# Patient Record
Sex: Female | Born: 1980 | Race: Black or African American | Hispanic: No | Marital: Single | Smoking: Former smoker
Health system: Southern US, Community
[De-identification: ages and names within clinical notes are randomized; demographics above are authoritative.]

## PROBLEM LIST (undated history)

## (undated) ENCOUNTER — Inpatient Hospital Stay: Payer: Self-pay

## (undated) DIAGNOSIS — I10 Essential (primary) hypertension: Secondary | ICD-10-CM

## (undated) DIAGNOSIS — Z8759 Personal history of other complications of pregnancy, childbirth and the puerperium: Secondary | ICD-10-CM

## (undated) DIAGNOSIS — D573 Sickle-cell trait: Secondary | ICD-10-CM

## (undated) DIAGNOSIS — O24419 Gestational diabetes mellitus in pregnancy, unspecified control: Secondary | ICD-10-CM

## (undated) HISTORY — DX: Gestational diabetes mellitus in pregnancy, unspecified control: O24.419

## (undated) HISTORY — PX: APPENDECTOMY: SHX54

---
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
## 2004-01-27 ENCOUNTER — Emergency Department: Payer: Self-pay | Admitting: Emergency Medicine

## 2004-04-18 ENCOUNTER — Emergency Department: Payer: Self-pay | Admitting: Emergency Medicine

## 2004-05-22 ENCOUNTER — Emergency Department: Payer: Self-pay | Admitting: Emergency Medicine

## 2004-06-13 ENCOUNTER — Emergency Department: Payer: Self-pay | Admitting: Emergency Medicine

## 2004-06-15 ENCOUNTER — Ambulatory Visit: Payer: Self-pay | Admitting: Emergency Medicine

## 2004-07-12 ENCOUNTER — Emergency Department: Payer: Self-pay | Admitting: Emergency Medicine

## 2004-12-23 ENCOUNTER — Emergency Department: Payer: Self-pay | Admitting: Emergency Medicine

## 2005-01-19 ENCOUNTER — Emergency Department: Payer: Self-pay | Admitting: Emergency Medicine

## 2005-03-07 ENCOUNTER — Emergency Department: Payer: Self-pay | Admitting: Emergency Medicine

## 2005-07-22 ENCOUNTER — Emergency Department: Payer: Self-pay | Admitting: General Practice

## 2005-08-16 ENCOUNTER — Emergency Department: Payer: Self-pay | Admitting: Internal Medicine

## 2005-12-07 ENCOUNTER — Emergency Department: Payer: Self-pay

## 2005-12-08 ENCOUNTER — Inpatient Hospital Stay: Payer: Self-pay

## 2006-01-27 ENCOUNTER — Observation Stay: Payer: Self-pay | Admitting: General Surgery

## 2006-02-20 ENCOUNTER — Encounter: Payer: Self-pay | Admitting: Maternal & Fetal Medicine

## 2006-03-08 ENCOUNTER — Observation Stay: Payer: Self-pay

## 2006-04-03 ENCOUNTER — Encounter: Payer: Self-pay | Admitting: Maternal & Fetal Medicine

## 2006-04-17 ENCOUNTER — Emergency Department: Payer: Self-pay | Admitting: Emergency Medicine

## 2006-05-03 ENCOUNTER — Observation Stay: Payer: Self-pay | Admitting: Certified Nurse Midwife

## 2006-05-05 ENCOUNTER — Observation Stay: Payer: Self-pay | Admitting: Obstetrics and Gynecology

## 2006-05-27 ENCOUNTER — Observation Stay: Payer: Self-pay

## 2006-05-31 ENCOUNTER — Observation Stay: Payer: Self-pay

## 2006-06-06 ENCOUNTER — Observation Stay: Payer: Self-pay | Admitting: Obstetrics and Gynecology

## 2006-06-23 ENCOUNTER — Observation Stay: Payer: Self-pay | Admitting: Obstetrics and Gynecology

## 2006-06-30 ENCOUNTER — Ambulatory Visit: Payer: Self-pay | Admitting: Obstetrics and Gynecology

## 2006-07-03 ENCOUNTER — Inpatient Hospital Stay: Payer: Self-pay | Admitting: Obstetrics and Gynecology

## 2006-09-21 ENCOUNTER — Emergency Department: Payer: Self-pay | Admitting: Internal Medicine

## 2006-12-11 ENCOUNTER — Emergency Department: Payer: Self-pay

## 2006-12-24 ENCOUNTER — Emergency Department: Payer: Self-pay | Admitting: Emergency Medicine

## 2007-09-13 ENCOUNTER — Emergency Department: Payer: Self-pay

## 2008-01-19 ENCOUNTER — Emergency Department: Payer: Self-pay | Admitting: Emergency Medicine

## 2008-04-23 ENCOUNTER — Emergency Department: Payer: Self-pay | Admitting: Emergency Medicine

## 2008-05-21 ENCOUNTER — Emergency Department: Payer: Self-pay | Admitting: Emergency Medicine

## 2008-11-10 ENCOUNTER — Emergency Department: Payer: Self-pay | Admitting: Emergency Medicine

## 2009-01-16 ENCOUNTER — Emergency Department: Payer: Self-pay | Admitting: Emergency Medicine

## 2009-07-29 ENCOUNTER — Emergency Department: Payer: Self-pay | Admitting: Emergency Medicine

## 2009-09-08 ENCOUNTER — Emergency Department: Payer: Self-pay | Admitting: Emergency Medicine

## 2009-09-09 ENCOUNTER — Emergency Department: Payer: Self-pay | Admitting: Emergency Medicine

## 2010-02-09 ENCOUNTER — Emergency Department: Payer: Self-pay | Admitting: Emergency Medicine

## 2010-03-03 ENCOUNTER — Emergency Department: Payer: Self-pay | Admitting: Emergency Medicine

## 2010-04-27 ENCOUNTER — Emergency Department: Payer: Self-pay | Admitting: Unknown Physician Specialty

## 2010-07-19 ENCOUNTER — Emergency Department: Payer: Self-pay | Admitting: Emergency Medicine

## 2010-08-20 ENCOUNTER — Emergency Department: Payer: Self-pay | Admitting: Emergency Medicine

## 2010-08-23 ENCOUNTER — Emergency Department: Payer: Self-pay | Admitting: Emergency Medicine

## 2011-01-11 ENCOUNTER — Emergency Department: Payer: Self-pay | Admitting: Emergency Medicine

## 2011-03-15 ENCOUNTER — Emergency Department: Payer: Self-pay | Admitting: Emergency Medicine

## 2011-03-15 LAB — PREGNANCY, URINE: Pregnancy Test, Urine: NEGATIVE m[IU]/mL

## 2011-05-23 ENCOUNTER — Emergency Department: Payer: Self-pay

## 2011-05-23 LAB — URINALYSIS, COMPLETE
Bacteria: NONE SEEN
Bilirubin,UR: NEGATIVE
Blood: NEGATIVE
Glucose,UR: NEGATIVE mg/dL (ref 0–75)
Ketone: NEGATIVE
Leukocyte Esterase: NEGATIVE
Nitrite: NEGATIVE
Ph: 5 (ref 4.5–8.0)
Protein: NEGATIVE
RBC,UR: <1 /HPF (ref 0–5)
Specific Gravity: 1.017 (ref 1.003–1.030)
Squamous Epithelial: 7
WBC UR: 1 /HPF (ref 0–5)

## 2011-05-23 LAB — PREGNANCY, URINE: Pregnancy Test, Urine: NEGATIVE m[IU]/mL

## 2012-01-04 ENCOUNTER — Emergency Department: Payer: Self-pay | Admitting: Internal Medicine

## 2012-01-04 LAB — URINALYSIS, COMPLETE
Blood: NEGATIVE
Glucose,UR: NEGATIVE mg/dL (ref 0–75)
Nitrite: NEGATIVE
Protein: NEGATIVE
RBC,UR: 4 /HPF (ref 0–5)
Specific Gravity: 1.016 (ref 1.003–1.030)
WBC UR: 2 /HPF (ref 0–5)

## 2012-01-04 LAB — CBC
HCT: 37.9 % (ref 35.0–47.0)
HGB: 12.7 g/dL (ref 12.0–16.0)
MCHC: 33.6 g/dL (ref 32.0–36.0)
MCV: 75 fL — ABNORMAL LOW (ref 80–100)
RDW: 17.4 % — ABNORMAL HIGH (ref 11.5–14.5)
WBC: 8.3 10*3/uL (ref 3.6–11.0)

## 2012-01-04 LAB — COMPREHENSIVE METABOLIC PANEL
Albumin: 3.7 g/dL (ref 3.4–5.0)
Alkaline Phosphatase: 72 U/L (ref 50–136)
Bilirubin,Total: 0.4 mg/dL (ref 0.2–1.0)
Calcium, Total: 8.6 mg/dL (ref 8.5–10.1)
Chloride: 104 mmol/L (ref 98–107)
Co2: 27 mmol/L (ref 21–32)
Creatinine: 0.93 mg/dL (ref 0.60–1.30)
Glucose: 89 mg/dL (ref 65–99)
SGOT(AST): 22 U/L (ref 15–37)
SGPT (ALT): 28 U/L (ref 12–78)
Total Protein: 8.2 g/dL (ref 6.4–8.2)

## 2012-01-04 LAB — TSH: Thyroid Stimulating Horm: 0.593 u[IU]/mL

## 2012-01-04 LAB — LIPASE, BLOOD: Lipase: 51 U/L — ABNORMAL LOW (ref 73–393)

## 2012-08-13 ENCOUNTER — Emergency Department: Payer: Self-pay | Admitting: Internal Medicine

## 2012-09-02 ENCOUNTER — Emergency Department: Payer: Self-pay | Admitting: Emergency Medicine

## 2012-09-02 LAB — CBC
HCT: 37.4 % (ref 35.0–47.0)
HGB: 12.2 g/dL (ref 12.0–16.0)
MCH: 24.5 pg — ABNORMAL LOW (ref 26.0–34.0)
MCHC: 32.6 g/dL (ref 32.0–36.0)
Platelet: 290 10*3/uL (ref 150–440)
RBC: 4.98 10*6/uL (ref 3.80–5.20)

## 2012-09-02 LAB — COMPREHENSIVE METABOLIC PANEL
Albumin: 3.6 g/dL (ref 3.4–5.0)
Alkaline Phosphatase: 74 U/L (ref 50–136)
Anion Gap: 4 — ABNORMAL LOW (ref 7–16)
Calcium, Total: 8.7 mg/dL (ref 8.5–10.1)
Chloride: 106 mmol/L (ref 98–107)
Creatinine: 1 mg/dL (ref 0.60–1.30)
Glucose: 85 mg/dL (ref 65–99)
Osmolality: 280 (ref 275–301)
SGOT(AST): 17 U/L (ref 15–37)
SGPT (ALT): 27 U/L (ref 12–78)
Total Protein: 8.3 g/dL — ABNORMAL HIGH (ref 6.4–8.2)

## 2012-09-02 LAB — URINALYSIS, COMPLETE
Bacteria: NONE SEEN
Leukocyte Esterase: NEGATIVE
Nitrite: NEGATIVE
Protein: 30
RBC,UR: 1182 /HPF (ref 0–5)
Specific Gravity: 1.019 (ref 1.003–1.030)
Squamous Epithelial: 1
WBC UR: 2 /HPF (ref 0–5)

## 2012-09-02 LAB — HCG, QUANTITATIVE, PREGNANCY: Beta Hcg, Quant.: <1 m[IU]/mL — ABNORMAL LOW

## 2012-09-02 LAB — GC/CHLAMYDIA PROBE AMP

## 2012-10-16 ENCOUNTER — Emergency Department: Payer: Self-pay | Admitting: Emergency Medicine

## 2012-10-16 LAB — COMPREHENSIVE METABOLIC PANEL
Alkaline Phosphatase: 72 U/L (ref 50–136)
BUN: 11 mg/dL (ref 7–18)
Bilirubin,Total: 0.3 mg/dL (ref 0.2–1.0)
Chloride: 105 mmol/L (ref 98–107)
Co2: 27 mmol/L (ref 21–32)
EGFR (Non-African Amer.): >60
Osmolality: 276 (ref 275–301)
Potassium: 3.6 mmol/L (ref 3.5–5.1)
SGPT (ALT): 26 U/L (ref 12–78)
Total Protein: 7.9 g/dL (ref 6.4–8.2)

## 2012-10-16 LAB — CBC
HCT: 36 % (ref 35.0–47.0)
HGB: 11.9 g/dL — ABNORMAL LOW (ref 12.0–16.0)
MCH: 25.2 pg — ABNORMAL LOW (ref 26.0–34.0)
MCHC: 33.1 g/dL (ref 32.0–36.0)
Platelet: 291 10*3/uL (ref 150–440)
RDW: 16.9 % — ABNORMAL HIGH (ref 11.5–14.5)

## 2012-10-16 LAB — TROPONIN I: Troponin-I: <0.02 ng/mL

## 2012-10-17 LAB — URINALYSIS, COMPLETE
Bilirubin,UR: NEGATIVE
Glucose,UR: NEGATIVE mg/dL (ref 0–75)
Ph: 6 (ref 4.5–8.0)
Protein: NEGATIVE
Specific Gravity: 1.018 (ref 1.003–1.030)

## 2012-10-17 LAB — CBC
HCT: 36.7 % (ref 35.0–47.0)
HGB: 12.3 g/dL (ref 12.0–16.0)
MCH: 25.3 pg — ABNORMAL LOW (ref 26.0–34.0)
MCHC: 33.6 g/dL (ref 32.0–36.0)
MCV: 75 fL — ABNORMAL LOW (ref 80–100)
RDW: 16.9 % — ABNORMAL HIGH (ref 11.5–14.5)
WBC: 15.6 10*3/uL — ABNORMAL HIGH (ref 3.6–11.0)

## 2012-10-17 LAB — BASIC METABOLIC PANEL
Anion Gap: 8 (ref 7–16)
BUN: 14 mg/dL (ref 7–18)
Chloride: 108 mmol/L — ABNORMAL HIGH (ref 98–107)
Co2: 22 mmol/L (ref 21–32)
Creatinine: 0.96 mg/dL (ref 0.60–1.30)
EGFR (African American): >60
EGFR (Non-African Amer.): >60
Osmolality: 278 (ref 275–301)
Potassium: 4.2 mmol/L (ref 3.5–5.1)

## 2012-10-17 LAB — TSH: Thyroid Stimulating Horm: 0.166 u[IU]/mL — ABNORMAL LOW

## 2012-10-17 LAB — MAGNESIUM: Magnesium: 1.7 mg/dL — ABNORMAL LOW

## 2012-10-18 LAB — URINE CULTURE

## 2013-11-21 ENCOUNTER — Emergency Department: Payer: Self-pay | Admitting: Emergency Medicine

## 2013-11-21 LAB — COMPREHENSIVE METABOLIC PANEL
ALK PHOS: 65 U/L
AST: 29 U/L (ref 15–37)
Albumin: 3.8 g/dL (ref 3.4–5.0)
Anion Gap: 6 — ABNORMAL LOW (ref 7–16)
BUN: 6 mg/dL — ABNORMAL LOW (ref 7–18)
Bilirubin,Total: 0.4 mg/dL (ref 0.2–1.0)
CHLORIDE: 106 mmol/L (ref 98–107)
Calcium, Total: 8.7 mg/dL (ref 8.5–10.1)
Co2: 28 mmol/L (ref 21–32)
Creatinine: 1.01 mg/dL (ref 0.60–1.30)
EGFR (African American): >60
EGFR (Non-African Amer.): >60
Glucose: 89 mg/dL (ref 65–99)
Osmolality: 276 (ref 275–301)
Potassium: 3.7 mmol/L (ref 3.5–5.1)
SGPT (ALT): 38 U/L
Sodium: 140 mmol/L (ref 136–145)
Total Protein: 8.4 g/dL — ABNORMAL HIGH (ref 6.4–8.2)

## 2013-11-21 LAB — URINALYSIS, COMPLETE
BLOOD: NEGATIVE
Bacteria: NONE SEEN
Bilirubin,UR: NEGATIVE
Glucose,UR: NEGATIVE mg/dL (ref 0–75)
Ketone: NEGATIVE
NITRITE: NEGATIVE
PH: 5 (ref 4.5–8.0)
Protein: NEGATIVE
RBC,UR: <1 /HPF (ref 0–5)
Specific Gravity: 1.018 (ref 1.003–1.030)
WBC UR: 1 /HPF (ref 0–5)

## 2013-11-21 LAB — TROPONIN I: Troponin-I: <0.02 ng/mL

## 2013-11-21 LAB — CBC
HCT: 38.5 % (ref 35.0–47.0)
HGB: 12.3 g/dL (ref 12.0–16.0)
MCH: 24.2 pg — AB (ref 26.0–34.0)
MCHC: 31.9 g/dL — ABNORMAL LOW (ref 32.0–36.0)
MCV: 76 fL — ABNORMAL LOW (ref 80–100)
PLATELETS: 315 10*3/uL (ref 150–440)
RBC: 5.08 10*6/uL (ref 3.80–5.20)
RDW: 17.1 % — ABNORMAL HIGH (ref 11.5–14.5)
WBC: 8.3 10*3/uL (ref 3.6–11.0)

## 2013-11-21 LAB — RAPID INFLUENZA A&B ANTIGENS

## 2013-11-21 LAB — PREGNANCY, URINE: Pregnancy Test, Urine: NEGATIVE m[IU]/mL

## 2014-01-06 ENCOUNTER — Emergency Department: Payer: Self-pay | Admitting: Emergency Medicine

## 2014-07-07 ENCOUNTER — Encounter: Payer: Self-pay | Admitting: Emergency Medicine

## 2014-07-07 ENCOUNTER — Other Ambulatory Visit: Payer: Self-pay

## 2014-07-07 ENCOUNTER — Emergency Department
Admission: EM | Admit: 2014-07-07 | Discharge: 2014-07-07 | Disposition: A | Discharge status: Home / Self Care | Payer: Medicaid Other | Attending: Emergency Medicine | Admitting: Emergency Medicine

## 2014-07-07 DIAGNOSIS — R609 Edema, unspecified: Secondary | ICD-10-CM

## 2014-07-07 DIAGNOSIS — Z880 Allergy status to penicillin: Secondary | ICD-10-CM | POA: Insufficient documentation

## 2014-07-07 DIAGNOSIS — F410 Panic disorder [episodic paroxysmal anxiety] without agoraphobia: Secondary | ICD-10-CM | POA: Diagnosis present

## 2014-07-07 DIAGNOSIS — R600 Localized edema: Secondary | ICD-10-CM | POA: Insufficient documentation

## 2014-07-07 DIAGNOSIS — I10 Essential (primary) hypertension: Secondary | ICD-10-CM | POA: Insufficient documentation

## 2014-07-07 HISTORY — DX: Essential (primary) hypertension: I10

## 2014-07-07 LAB — CBC
HCT: 35.7 % (ref 35.0–47.0)
Hemoglobin: 11.4 g/dL — ABNORMAL LOW (ref 12.0–16.0)
MCH: 24 pg — ABNORMAL LOW (ref 26.0–34.0)
MCHC: 31.8 g/dL — AB (ref 32.0–36.0)
MCV: 75.4 fL — AB (ref 80.0–100.0)
Platelets: 295 10*3/uL (ref 150–440)
RBC: 4.74 MIL/uL (ref 3.80–5.20)
RDW: 17.5 % — ABNORMAL HIGH (ref 11.5–14.5)
WBC: 7.9 10*3/uL (ref 3.6–11.0)

## 2014-07-07 LAB — COMPREHENSIVE METABOLIC PANEL
ALBUMIN: 3.7 g/dL (ref 3.5–5.0)
ALK PHOS: 51 U/L (ref 38–126)
ALT: 33 U/L (ref 14–54)
AST: 23 U/L (ref 15–41)
Anion gap: 6 (ref 5–15)
BILIRUBIN TOTAL: 0.3 mg/dL (ref 0.3–1.2)
BUN: 15 mg/dL (ref 6–20)
CO2: 28 mmol/L (ref 22–32)
Calcium: 8.5 mg/dL — ABNORMAL LOW (ref 8.9–10.3)
Chloride: 106 mmol/L (ref 101–111)
Creatinine, Ser: 1.05 mg/dL — ABNORMAL HIGH (ref 0.44–1.00)
GFR calc Af Amer: >60 mL/min (ref >60)
GFR calc non Af Amer: >60 mL/min (ref >60)
Glucose, Bld: 100 mg/dL — ABNORMAL HIGH (ref 65–99)
Potassium: 4 mmol/L (ref 3.5–5.1)
SODIUM: 140 mmol/L (ref 135–145)
TOTAL PROTEIN: 7.6 g/dL (ref 6.5–8.1)

## 2014-07-07 LAB — TROPONIN I: Troponin I: <0.03 ng/mL (ref <0.031)

## 2014-07-07 NOTE — ED Notes (Signed)
States stopped taking HCTZ one month ago for hypertension.

## 2014-07-07 NOTE — Discharge Instructions (Signed)
Peripheral Edema °You have swelling in your legs (peripheral edema). This swelling is due to excess accumulation of salt and water in your body. Edema may be a sign of heart, kidney or liver disease, or a side effect of a medication. It may also be due to problems in the leg veins. Elevating your legs and using special support stockings may be very helpful, if the cause of the swelling is due to poor venous circulation. Avoid long periods of standing, whatever the cause. °Treatment of edema depends on identifying the cause. Chips, pretzels, pickles and other salty foods should be avoided. Restricting salt in your diet is almost always needed. Water pills (diuretics) are often used to remove the excess salt and water from your body via urine. These medicines prevent the kidney from reabsorbing sodium. This increases urine flow. °Diuretic treatment may also result in lowering of potassium levels in your body. Potassium supplements may be needed if you have to use diuretics daily. Daily weights can help you keep track of your progress in clearing your edema. You should call your caregiver for follow up care as recommended. °SEEK IMMEDIATE MEDICAL CARE IF:  °· You have increased swelling, pain, redness, or heat in your legs. °· You develop shortness of breath, especially when lying down. °· You develop chest or abdominal pain, weakness, or fainting. °· You have a fever. °Document Released: 03/10/2004 Document Revised: 04/25/2011 Document Reviewed: 02/18/2009 °ExitCare® Patient Information ©2015 [employer]. This information is not intended to replace advice given to you by your health care provider. Make sure you discuss any questions you have with your health care provider. ° °

## 2014-07-07 NOTE — ED Notes (Signed)
Denies chest pain or SOB

## 2014-07-07 NOTE — ED Provider Notes (Signed)
[facility] Emergency Department Provider Note  ____________________________________________  Time seen: 11 AM  I have reviewed the triage vital signs and the nursing notes.   HISTORY  Chief Complaint Leg Swelling    HPI Yvette Wilson is a 34 y.o. female presents with complaints of extremity edema. Patient reports this morning she felt like her ankles were more swollen than normal and her hands as well. She notes a history of high blood pressure. This never had swelling like this before. She is not having any chest pain or shortness of breath. She does admit to a high sodium intake. She reports that the swelling has gone down significantly simply by elevating her legs.     Past Medical History  Diagnosis Date  . Hypertension     There are no active problems to display for this patient.   No past surgical history on file.  No current outpatient prescriptions on file.  Allergies Penicillins  No family history on file.  Social History History  Substance Use Topics  . Smoking status: Not on file  . Smokeless tobacco: Not on file  . Alcohol Use: Not on file    Review of Systems  Constitutional: Negative for fever. Eyes: Negative for visual changes. Cardiovascular: Negative for chest pain. Respiratory: Negative for shortness of breath. Genitourinary: Negative for dysuria. Musculoskeletal: Negative for back pain. Skin: Negative for rash. Neurological: Negative for headaches, focal weakness or numbness.   10-point ROS otherwise negative.  ____________________________________________   PHYSICAL EXAM:  VITAL SIGNS: ED Triage Vitals  Enc Vitals Group     BP 07/07/14 0816 158/99 mmHg     Pulse Rate 07/07/14 0816 59     Resp 07/07/14 0816 20     Temp 07/07/14 0816 97.8 F (36.6 C)     Temp Source 07/07/14 0816 Oral     SpO2 07/07/14 0816 99 %     Weight 07/07/14 0816 300 lb (136.079 kg)     Height 07/07/14 0816 5\' 8"  (1.727 m)      Head Cir --      Peak Flow --      Pain Score 07/07/14 0817 6     Pain Loc --      Pain Edu? --      Excl. in GC? --     Constitutional: Alert and oriented. Well appearing and in no distress. Eyes: Conjunctivae are normal. PERRL. Normal extraocular movements.. Cardiovascular: Normal rate, regular rhythm. Normal and symmetric distal pulses are present in all extremities. No murmurs, rubs, or gallops. Respiratory: Normal respiratory effort without tachypnea nor retractions. Breath sounds are clear and equal bilaterally. No wheezes/rales/rhonchi. Gastrointestinal: Soft and nontender. No distention. There is no CVA tenderness. Genitourinary: deferred Musculoskeletal: Nontender with normal range of motion in all extremities. No joint effusions.  1+ edema to mid tibia bilaterally, very mild. No calf tenderness palpation Neurologic:  Normal speech and language. No gross focal neurologic deficits are appreciated. Speech is normal.  Skin:  Skin is warm, dry and intact. No rash noted. Psychiatric: Mood and affect are normal. Speech and behavior are normal. Patient exhibits appropriate insight and judgment.  ____________________________________________    LABS (pertinent positives/negatives)  Labs Reviewed  CBC - Abnormal; Notable for the following:    Hemoglobin 11.4 (*)    MCV 75.4 (*)    MCH 24.0 (*)    MCHC 31.8 (*)    RDW 17.5 (*)    All other components within normal limits  COMPREHENSIVE  METABOLIC PANEL - Abnormal; Notable for the following:    Glucose, Bld 100 (*)    Creatinine, Ser 1.05 (*)    Calcium 8.5 (*)    All other components within normal limits  TROPONIN I     ____________________________________________   EKG  Interpreted by me  Date: 07/07/2014  Rate: 60.  Rhythm: normal sinus rhythm  QRS Axis: normal  Intervals: normal  ST/T Wave abnormalities: normal  Conduction Disutrbances: none  Narrative Interpretation:  unremarkable      ____________________________________________    RADIOLOGY  None  ____________________________________________   PROCEDURES  Procedure(s) performed: None  Critical Care performed: None  ____________________________________________   INITIAL IMPRESSION / ASSESSMENT AND PLAN / ED COURSE  Pertinent labs & imaging results that were available during my care of the patient were reviewed by me and considered in my medical decision making (see chart for details).  Patient very mild peripheral edema. Labs reassuring. No rales on exam no shortness of breath. Recommend decreased sodium intake and PCP follow-up for consideration of diuretic  ____________________________________________   FINAL CLINICAL IMPRESSION(S) / ED DIAGNOSES  Final diagnoses:  Peripheral edema     Jene Every, MD 07/07/14 1136

## 2014-07-08 ENCOUNTER — Inpatient Hospital Stay (HOSPITAL_COMMUNITY)
Admission: AD | Admit: 2014-07-08 | Discharge: 2014-07-08 | Disposition: A | Discharge status: Home / Self Care | Payer: Self-pay | Source: Ambulatory Visit | Attending: Family Medicine | Admitting: Family Medicine

## 2014-07-08 ENCOUNTER — Emergency Department (HOSPITAL_COMMUNITY)
Admission: EM | Admit: 2014-07-08 | Discharge: 2014-07-08 | Discharge status: Against Medical Advice | Payer: Medicaid Other

## 2014-07-08 ENCOUNTER — Encounter (HOSPITAL_COMMUNITY): Payer: Self-pay | Admitting: *Deleted

## 2014-07-08 DIAGNOSIS — I10 Essential (primary) hypertension: Secondary | ICD-10-CM | POA: Insufficient documentation

## 2014-07-08 DIAGNOSIS — G43909 Migraine, unspecified, not intractable, without status migrainosus: Secondary | ICD-10-CM | POA: Insufficient documentation

## 2014-07-08 DIAGNOSIS — Z87891 Personal history of nicotine dependence: Secondary | ICD-10-CM | POA: Insufficient documentation

## 2014-07-08 DIAGNOSIS — G43919 Migraine, unspecified, intractable, without status migrainosus: Secondary | ICD-10-CM

## 2014-07-08 LAB — COMPREHENSIVE METABOLIC PANEL
ALT: 29 U/L (ref 14–54)
AST: 20 U/L (ref 15–41)
Albumin: 3.7 g/dL (ref 3.5–5.0)
Alkaline Phosphatase: 56 U/L (ref 38–126)
Anion gap: 5 (ref 5–15)
BILIRUBIN TOTAL: 0.2 mg/dL — AB (ref 0.3–1.2)
BUN: 10 mg/dL (ref 6–20)
CHLORIDE: 106 mmol/L (ref 101–111)
CO2: 30 mmol/L (ref 22–32)
Calcium: 8.9 mg/dL (ref 8.9–10.3)
Creatinine, Ser: 0.93 mg/dL (ref 0.44–1.00)
GFR calc non Af Amer: >60 mL/min (ref >60)
GLUCOSE: 95 mg/dL (ref 65–99)
POTASSIUM: 3.7 mmol/L (ref 3.5–5.1)
Sodium: 141 mmol/L (ref 135–145)
Total Protein: 7.8 g/dL (ref 6.5–8.1)

## 2014-07-08 LAB — URINALYSIS, ROUTINE W REFLEX MICROSCOPIC
Bilirubin Urine: NEGATIVE
GLUCOSE, UA: NEGATIVE mg/dL
Hgb urine dipstick: NEGATIVE
Ketones, ur: NEGATIVE mg/dL
NITRITE: NEGATIVE
PH: 6 (ref 5.0–8.0)
Protein, ur: NEGATIVE mg/dL
Specific Gravity, Urine: 1.025 (ref 1.005–1.030)
Urobilinogen, UA: 0.2 mg/dL (ref 0.0–1.0)

## 2014-07-08 LAB — CBC
HCT: 33.8 % — ABNORMAL LOW (ref 36.0–46.0)
HEMOGLOBIN: 11.1 g/dL — AB (ref 12.0–15.0)
MCH: 24.6 pg — ABNORMAL LOW (ref 26.0–34.0)
MCHC: 32.8 g/dL (ref 30.0–36.0)
MCV: 74.9 fL — AB (ref 78.0–100.0)
Platelets: 302 10*3/uL (ref 150–400)
RBC: 4.51 MIL/uL (ref 3.87–5.11)
RDW: 16.7 % — ABNORMAL HIGH (ref 11.5–15.5)
WBC: 8.4 10*3/uL (ref 4.0–10.5)

## 2014-07-08 LAB — URINE MICROSCOPIC-ADD ON

## 2014-07-08 LAB — POCT PREGNANCY, URINE: Preg Test, Ur: NEGATIVE

## 2014-07-08 MED ORDER — CYCLOBENZAPRINE HCL 10 MG PO TABS: 10.0000 mg | ORAL_TABLET | Freq: Two times a day (BID) | ORAL | Status: DC | PRN

## 2014-07-08 MED ORDER — DIPHENHYDRAMINE HCL 25 MG PO CAPS
25.0000 mg | ORAL_CAPSULE | Freq: Once | ORAL | Status: AC
  Administered 2014-07-08: 25 mg via ORAL
  Filled 2014-07-08: qty 1

## 2014-07-08 MED ORDER — DEXAMETHASONE 4 MG PO TABS
10.0000 mg | ORAL_TABLET | Freq: Once | ORAL | Status: AC
  Administered 2014-07-08: 10 mg via ORAL
  Filled 2014-07-08: qty 2.5

## 2014-07-08 MED ORDER — METOCLOPRAMIDE HCL 10 MG PO TABS
10.0000 mg | ORAL_TABLET | Freq: Once | ORAL | Status: AC
  Administered 2014-07-08: 10 mg via ORAL
  Filled 2014-07-08: qty 1

## 2014-07-08 MED ORDER — HYDROCHLOROTHIAZIDE 25 MG PO TABS: 25.0000 mg | ORAL_TABLET | Freq: Every day | ORAL | Status: DC

## 2014-07-08 NOTE — MAU Note (Signed)
Denies hx of migraines; does have hx of Hypertension, not currently on meds- off for about a month due to lack of health ins

## 2014-07-08 NOTE — MAU Provider Note (Signed)
History     CSN: [id number]  Arrival date and time: 07/08/14 1759   First Provider Initiated Contact with Patient 07/08/14 1908      Chief Complaint  Patient presents with  . Headache   HPI Pt is 34 yo not pregnant female who presents with migraine headache for 2 days Has light sensitivity and blurred vision- no weakness or difficulty walking or neuro changes Headache is diffuse- frontal Pt denies GYN problems Pt came [facility] because there was a 5 hour wait Pt hs taken Ibuprofen without relief Pt was seen yesterday at [facility] and had blood drawn but no medication for headache Pt's headache has gotten worse today Pt is hypertensive but has not been taking medication b/c hasn't been able to see a doctor since lost insurance last got HCTZ from  ED  Past Medical History  Diagnosis Date  . Hypertension     Past Surgical History  Procedure Laterality Date  . Appendectomy    . Cesarean section      History reviewed. No pertinent family history.  History  Substance Use Topics  . Smoking status: Former Games developer  . Smokeless tobacco: Not on file  . Alcohol Use: No    Allergies:  Allergies  Allergen Reactions  . Penicillins Rash    Prescriptions prior to admission  Medication Sig Dispense Refill Last Dose  . ibuprofen (ADVIL,MOTRIN) 200 MG tablet Take 800 mg by mouth every 6 (six) hours as needed for headache or mild pain.   07/08/2014 at 0730    Review of Systems  Constitutional: Positive for malaise/fatigue. Negative for fever, chills and diaphoresis.  Eyes: Positive for blurred vision and photophobia.  Respiratory: Negative for cough.   Gastrointestinal: Positive for nausea. Negative for vomiting, abdominal pain, diarrhea and constipation.  Genitourinary: Negative for dysuria.  Neurological: Positive for headaches. Negative for dizziness, tingling and weakness.   Physical Exam   Blood pressure 166/101, pulse 63, temperature 98.6 F (37 C), temperature source  Oral, resp. rate 18, last menstrual period 06/19/2014, SpO2 100 %.  Physical Exam  Vitals reviewed. Constitutional: She is oriented to person, place, and time. She appears well-developed and well-nourished. No distress.  HENT:  Head: Normocephalic.  Eyes: Pupils are equal, round, and reactive to light.  Neck: Normal range of motion. Neck supple.  Cardiovascular: Normal rate.   Respiratory: Effort normal.  GI: Soft.  Musculoskeletal: She exhibits edema.  Neurological: She is alert and oriented to person, place, and time.  Skin: Skin is warm and dry.  Psychiatric: She has a normal mood and affect.    MAU Course  Procedures Results for orders placed or performed during the hospital encounter of 07/08/14 (from the past 24 hour(s))  Urinalysis, Routine w reflex microscopic     Status: Abnormal   Collection Time: 07/08/14  6:20 PM  Result Value Ref Range   Color, Urine YELLOW YELLOW   APPearance HAZY (A) CLEAR   Specific Gravity, Urine 1.025 1.005 - 1.030   pH 6.0 5.0 - 8.0   Glucose, UA NEGATIVE NEGATIVE mg/dL   Hgb urine dipstick NEGATIVE NEGATIVE   Bilirubin Urine NEGATIVE NEGATIVE   Ketones, ur NEGATIVE NEGATIVE mg/dL   Protein, ur NEGATIVE NEGATIVE mg/dL   Urobilinogen, UA 0.2 0.0 - 1.0 mg/dL   Nitrite NEGATIVE NEGATIVE   Leukocytes, UA SMALL (A) NEGATIVE  Urine microscopic-add on     Status: Abnormal   Collection Time: 07/08/14  6:20 PM  Result Value Ref Range   Squamous  Epithelial / LPF MANY (A) RARE   WBC, UA 3-6 <3 WBC/hpf  Pregnancy, urine POC     Status: None   Collection Time: 07/08/14  6:26 PM  Result Value Ref Range   Preg Test, Ur NEGATIVE NEGATIVE  CBC     Status: Abnormal   Collection Time: 07/08/14  7:15 PM  Result Value Ref Range   WBC 8.4 4.0 - 10.5 K/uL   RBC 4.51 3.87 - 5.11 MIL/uL   Hemoglobin 11.1 (L) 12.0 - 15.0 g/dL   HCT 40.933.8 (L) 81.136.0 - 91.446.0 %   MCV 74.9 (L) 78.0 - 100.0 fL   MCH 24.6 (L) 26.0 - 34.0 pg   MCHC 32.8 30.0 - 36.0 g/dL   RDW  78.216.7 (H) 95.611.5 - 15.5 %   Platelets 302 150 - 400 K/uL  Comprehensive metabolic panel     Status: Abnormal   Collection Time: 07/08/14  7:15 PM  Result Value Ref Range   Sodium 141 135 - 145 mmol/L   Potassium 3.7 3.5 - 5.1 mmol/L   Chloride 106 101 - 111 mmol/L   CO2 30 22 - 32 mmol/L   Glucose, Bld 95 65 - 99 mg/dL   BUN 10 6 - 20 mg/dL   Creatinine, Ser 2.130.93 0.44 - 1.00 mg/dL   Calcium 8.9 8.9 - 08.610.3 mg/dL   Total Protein 7.8 6.5 - 8.1 g/dL   Albumin 3.7 3.5 - 5.0 g/dL   AST 20 15 - 41 U/L   ALT 29 14 - 54 U/L   Alkaline Phosphatase 56 38 - 126 U/L   Total Bilirubin 0.2 (L) 0.3 - 1.2 mg/dL   GFR calc non Af Amer >60 >60 mL/min   GFR calc Af Amer >60 >60 mL/min   Anion gap 5 5 - 15  headache protocol- Benadryl 25mg  Reglan 10mg  Decadron 10mg  PO BP after headache protocol- level 3/10 pain; BP 159/85- blurred vision gone Assessment and Plan  Migraine- Rx Flexeril Hypertension- HCTZ 25mg  F/u with [facility]  Kaylynn Chamblin 07/08/2014, 8:23 PM

## 2014-07-08 NOTE — MAU Note (Signed)
Felt dizzy on Sat, Headache started. Took ibuprofen, initially improved.  As headache returned, it has gotten worse. Noted swelling in hands and feet.  Went to [facility] yesterday, was dc'd to home, HA has been constance since then, feels dizzy at times, feels in a fog, vision is blurred, light sensative.

## 2014-07-08 NOTE — ED Notes (Signed)
Pt called x2. No answer.  

## 2014-07-21 ENCOUNTER — Emergency Department
Admission: EM | Admit: 2014-07-21 | Discharge: 2014-07-21 | Disposition: A | Discharge status: Home / Self Care | Payer: Medicaid Other | Attending: Emergency Medicine | Admitting: Emergency Medicine

## 2014-07-21 ENCOUNTER — Encounter: Payer: Self-pay | Admitting: Emergency Medicine

## 2014-07-21 DIAGNOSIS — K0501 Acute gingivitis, non-plaque induced: Secondary | ICD-10-CM | POA: Insufficient documentation

## 2014-07-21 DIAGNOSIS — Z79899 Other long term (current) drug therapy: Secondary | ICD-10-CM | POA: Insufficient documentation

## 2014-07-21 DIAGNOSIS — K0500 Acute gingivitis, plaque induced: Secondary | ICD-10-CM

## 2014-07-21 DIAGNOSIS — I10 Essential (primary) hypertension: Secondary | ICD-10-CM | POA: Insufficient documentation

## 2014-07-21 DIAGNOSIS — Z87891 Personal history of nicotine dependence: Secondary | ICD-10-CM | POA: Insufficient documentation

## 2014-07-21 DIAGNOSIS — Z880 Allergy status to penicillin: Secondary | ICD-10-CM | POA: Insufficient documentation

## 2014-07-21 MED ORDER — CLINDAMYCIN HCL 150 MG PO CAPS
ORAL_CAPSULE | ORAL | Status: AC
  Administered 2014-07-21: 450 mg via ORAL
  Filled 2014-07-21: qty 3

## 2014-07-21 MED ORDER — CLINDAMYCIN HCL 150 MG PO CAPS
450.0000 mg | ORAL_CAPSULE | Freq: Once | ORAL | Status: AC
  Administered 2014-07-21: 450 mg via ORAL

## 2014-07-21 MED ORDER — KETOROLAC TROMETHAMINE 60 MG/2ML IM SOLN
INTRAMUSCULAR | Status: AC
  Administered 2014-07-21: 60 mg via INTRAMUSCULAR
  Filled 2014-07-21: qty 2

## 2014-07-21 MED ORDER — CLINDAMYCIN HCL 150 MG PO CAPS: 450.0000 mg | ORAL_CAPSULE | Freq: Three times a day (TID) | ORAL | Status: DC

## 2014-07-21 MED ORDER — NAPROXEN 250 MG PO TABS: 250.0000 mg | ORAL_TABLET | Freq: Two times a day (BID) | ORAL | Status: DC

## 2014-07-21 MED ORDER — KETOROLAC TROMETHAMINE 60 MG/2ML IM SOLN
60.0000 mg | Freq: Once | INTRAMUSCULAR | Status: AC
  Administered 2014-07-21: 60 mg via INTRAMUSCULAR

## 2014-07-21 NOTE — ED Provider Notes (Signed)
[facility] Emergency Department Provider Note  ____________________________________________  Time seen: 3:00 AM  I have reviewed the triage vital signs and the nursing notes.   HISTORY  Chief Complaint Dental Pain    HPI Yvette Wilson is a 34 y.o. female who complains of a left upper toothache for 2 days. No fever or chills. She noted this morning that her face seem to be swelling on the left side there. No throat pain or difficulty swallowing or difficulty with breathing. No vision change or eye pain or headache.Pain is moderate intensity, throbbing, nonradiating, no aggravating or alleviating factors.     Past Medical History  Diagnosis Date  . Hypertension     Patient Active Problem List   Diagnosis Date Noted  . Anxiety attack 07/07/2014    Past Surgical History  Procedure Laterality Date  . Appendectomy    . Cesarean section      Current Outpatient Rx  Name  Route  Sig  Dispense  Refill  . hydrochlorothiazide (HYDRODIURIL) 25 MG tablet   Oral   Take 1 tablet (25 mg total) by mouth daily.   30 tablet   0   . clindamycin (CLEOCIN) 150 MG capsule   Oral   Take 3 capsules (450 mg total) by mouth 3 (three) times daily.   90 capsule   0   . cyclobenzaprine (FLEXERIL) 10 MG tablet   Oral   Take 1 tablet (10 mg total) by mouth 2 (two) times daily as needed for muscle spasms.   20 tablet   0   . ibuprofen (ADVIL,MOTRIN) 200 MG tablet   Oral   Take 800 mg by mouth every 6 (six) hours as needed for headache or mild pain.         . naproxen (NAPROSYN) 250 MG tablet   Oral   Take 1 tablet (250 mg total) by mouth 2 (two) times daily with a meal.   40 tablet   0     Allergies Penicillins  History reviewed. No pertinent family history.  Social History History  Substance Use Topics  . Smoking status: Former Games developer  . Smokeless tobacco: Not on file  . Alcohol Use: No    Review of Systems  Constitutional: No fever or  chills. No weight changes Eyes:No blurry vision or double vision.  ENT: No sore throat. Dental pain as above Cardiovascular: No chest pain. Respiratory: No dyspnea or cough. Gastrointestinal: Negative for abdominal pain, vomiting and diarrhea.  No BRBPR or melena. Genitourinary: Negative for dysuria, urinary retention, bloody urine, or difficulty urinating. Musculoskeletal: Negative for back pain. No joint swelling or pain. Skin: Negative for rash. Neurological: Negative for headaches, focal weakness or numbness. Psychiatric:No anxiety or depression.   Endocrine:No hot/cold intolerance, changes in energy, or sleep difficulty.  10-point ROS otherwise negative.  ____________________________________________   PHYSICAL EXAM:  VITAL SIGNS: ED Triage Vitals  Enc Vitals Group     BP 07/21/14 0116 183/98 mmHg     Pulse Rate 07/21/14 0116 75     Resp 07/21/14 0116 18     Temp 07/21/14 0116 98.3 F (36.8 C)     Temp Source 07/21/14 0116 Oral     SpO2 07/21/14 0116 100 %     Weight 07/21/14 0111 300 lb (136.079 kg)     Height 07/21/14 0111  (1.727 m)     Head Cir --      Peak Flow --      Pain Score  07/21/14 0111 10     Pain Loc --      Pain Edu? --      Excl. in GC? --      Constitutional: Alert and oriented. Well appearing and in no distress. Eyes: No scleral icterus. No conjunctival pallor. PERRL. EOMI ENT   Head: Normocephalic and atraumatic. No facial swelling   Nose: No congestion/rhinnorhea. No septal hematoma   Mouth/Throat: MMM, no pharyngeal erythema. No peritonsillar mass. No uvula shift. Along the buccal gingival border above teeth 11 and 12, there is some erythema and hypertrophy of the gingiva. This area is tender to the touch and firm. There is no fluctuance or drainage. The teeth are all well seated and not loose, and there is no tenderness to tooth pressure. Floor of mouth is soft.   Neck: No stridor. No SubQ emphysema. No  meningismus. Hematological/Lymphatic/Immunilogical: No cervical lymphadenopathy. Cardiovascular: RRR. Normal and symmetric distal pulses are present in all extremities. No murmurs, rubs, or gallops. Respiratory: Normal respiratory effort without tachypnea nor retractions. Breath sounds are clear and equal bilaterally. No wheezes/rales/rhonchi. Musculoskeletal: Nontender with normal range of motion in all extremities. No joint effusions.  No lower extremity tenderness.  No edema. Neurologic:   Normal speech and language.  CN 2-10 normal. Motor grossly intact. No pronator drift.  Normal gait. No gross focal neurologic deficits are appreciated.  Skin:  Skin is warm, dry and intact. No rash noted.  No petechiae, purpura, or bullae. Psychiatric: Mood and affect are normal. Speech and behavior are normal. Patient exhibits appropriate insight and judgment.  ____________________________________________    LABS (pertinent positives/negatives) (all labs ordered are listed, but only abnormal results are displayed) Labs Reviewed - No data to display ____________________________________________   EKG    ____________________________________________    RADIOLOGY    ____________________________________________   PROCEDURES  ____________________________________________   INITIAL IMPRESSION / ASSESSMENT AND PLAN / ED COURSE  Pertinent labs & imaging results that were available during my care of the patient were reviewed by me and considered in my medical decision making (see chart for details).  Patient presents with symptomatic gingivitis of the left upper mouth. He does not appear to be a dental abscess at this time, but with the hypertrophy and inflammation in the area, we'll cover her with antibiotics for now while she follows up with dentistry. No evidence of abscess or intracranial spread of infection.  ____________________________________________   FINAL CLINICAL  IMPRESSION(S) / ED DIAGNOSES  Final diagnoses:  Gingivitis, acute      Sharman CheekPhillip Jaelin Devincentis, MD 07/21/14 [phone]

## 2014-07-21 NOTE — ED Notes (Signed)
Pt states left upper toothache for 2 days.

## 2014-07-21 NOTE — Discharge Instructions (Signed)
Dental Care and Dentist Visits Dental care supports good overall health. Regular dental visits can also help you avoid dental pain, bleeding, infection, and other more serious health problems in the future. It is important to keep the mouth healthy because diseases in the teeth, gums, and other oral tissues can spread to other areas of the body. Some problems, such as diabetes, heart disease, and pre-term labor have been associated with poor oral health.  See your dentist every 6 months. If you experience emergency problems such as a toothache or broken tooth, go to the dentist right away. If you see your dentist regularly, you may catch problems early. It is easier to be treated for problems in the early stages.  WHAT TO EXPECT AT A DENTIST VISIT  Your dentist will look for many common oral health problems and recommend proper treatment. At your regular dental visit, you can expect:  Gentle cleaning of the teeth and gums. This includes scraping and polishing. This helps to remove the sticky substance around the teeth and gums (plaque). Plaque forms in the mouth shortly after eating. Over time, plaque hardens on the teeth as tartar. If tartar is not removed regularly, it can cause problems. Cleaning also helps remove stains.  Periodic X-rays. These pictures of the teeth and supporting bone will help your dentist assess the health of your teeth.  Periodic fluoride treatments. Fluoride is a natural mineral shown to help strengthen teeth. Fluoride treatmentinvolves applying a fluoride gel or varnish to the teeth. It is most commonly done in children.  Examination of the mouth, tongue, jaws, teeth, and gums to look for any oral health problems, such as:  Cavities (dental caries). This is decay on the tooth caused by plaque, sugar, and acid in the mouth. It is best to catch a cavity when it is small.  Inflammation of the gums caused by plaque buildup (gingivitis).  Problems with the mouth or malformed  or misaligned teeth.  Oral cancer or other diseases of the soft tissues or jaws. KEEP YOUR TEETH AND GUMS HEALTHY For healthy teeth and gums, follow these general guidelines as well as your dentist's specific advice:  Have your teeth professionally cleaned at the dentist every 6 months.  Brush twice daily with a fluoride toothpaste.  Floss your teeth daily.  Ask your dentist if you need fluoride supplements, treatments, or fluoride toothpaste.  Eat a healthy diet. Reduce foods and drinks with added sugar.  Avoid smoking. TREATMENT FOR ORAL HEALTH PROBLEMS If you have oral health problems, treatment varies depending on the conditions present in your teeth and gums.  Your caregiver will most likely recommend good oral hygiene at each visit.  For cavities, gingivitis, or other oral health disease, your caregiver will perform a procedure to treat the problem. This is typically done at a separate appointment. Sometimes your caregiver will refer you to another dental specialist for specific tooth problems or for surgery. SEEK IMMEDIATE DENTAL CARE IF:  You have pain, bleeding, or soreness in the gum, tooth, jaw, or mouth area.  A permanent tooth becomes loose or separated from the gum socket.  You experience a blow or injury to the mouth or jaw area. Document Released: 10/13/2010 Document Revised: 04/25/2011 Document Reviewed: 10/13/2010 [facility] Patient Information 2015 [place]. This information is not intended to replace advice given to you by your health care provider. Make sure you discuss any questions you have with your health care provider.  Gingivitis Gingivitis is a form of gum (periodontal)  disease that causes redness, soreness, and swelling (inflammation) of your gums. CAUSES The most common cause of gingivitis is poor oral hygiene. A sticky substance made of bacteria, mucus, and food particles (plaque), is deposited on the exposed part of teeth. As plaque builds  up, it reacts with the saliva in your mouth to form something called  tartar. Tartar is a hard deposit that becomes trapped around the base of the tooth. Plaque and tartar irritate the gums, leading to the formation of gingivitis. Other factors that increase your risk for gingivitis include:   Tobacco use.  Diabetes.  Older age.  Certain medications.  Certain viral or fungal infections.  Dry mouth.  Hormonal changes such as during pregnancy.  Poor nutrition.  Substance abuse.  Poor fitting dental restorations or appliances. SYMPTOMS You may notice inflammation of the soft tissue (gingiva) around the teeth. When these tissues become inflamed, they bleed easily, especially during flossing or brushing. The gums may also be:   Tender to the touch.  Bright red, purple red, or have a shiny appearance.  Swollen.  Wearing away from the teeth (receding), which exposes more of the tooth. Bad breath is often present. Continued infection around teeth can eventually cause cavities and loosen teeth. This may lead to eventual tooth loss. DIAGNOSIS A medical and dental history will be taken. Your mouth, teeth, and gums will be examined. Your dentist will look for soft, swollen purple-red, irritated gums. There may be deposits of plaque and tartar at the base of the teeth. Your gums will be looked at for the degree of redness, puffiness, and bleeding tendencies. Your dentist will see if any of the teeth are loose. X-rays may be taken to see if the inflammation has spread to the supporting structures of the teeth. TREATMENT The goal is to reduce and reverse the inflammation. Proper treatment can usually reverse the symptoms of gingivitis and prevent further progression of the disease. Have your teeth cleaned. During the cleaning, all plaque and tartar will be removed. Instruction for proper home care will be given. You will need regular professional cleanings and check-ups in the future. HOME CARE  INSTRUCTIONS  Brush your teeth twice a day and floss at least once per day. When flossing, it is best to floss first then brush.  Limit sugar between meals and maintain a well-balanced diet.  Even the best dental hygiene will not prevent plaque from developing. It is necessary for you to see your dentist on a regular basis for cleaning and regular checkups.  Your dentist can recommend proper oral hygiene and mouth care and suggest special toothpastes or mouth rinses.  Stop smoking. SEEK DENTAL OR MEDICAL CARE IF:  You have painful, reddened tissue around your teeth, or you have puffy swollen gums.  You have difficulty chewing.  You notice any loose or infected teeth.  You have swollen glands.  Your gums bleed easily when you brush your teeth or are very tender to the touch. Document Released: 07/27/2000 Document Revised: 04/25/2011 Document Reviewed: 05/07/2010 [facility] Patient Information 2015 [place], [place]. This information is not intended to replace advice given to you by your health care provider. Make sure you discuss any questions you have with your health care provider.

## 2015-01-26 ENCOUNTER — Encounter: Payer: Self-pay | Admitting: Emergency Medicine

## 2015-01-26 ENCOUNTER — Emergency Department
Admission: EM | Admit: 2015-01-26 | Discharge: 2015-01-26 | Disposition: A | Discharge status: Home / Self Care | Payer: Medicaid Other | Attending: Emergency Medicine | Admitting: Emergency Medicine

## 2015-01-26 DIAGNOSIS — Z79899 Other long term (current) drug therapy: Secondary | ICD-10-CM | POA: Insufficient documentation

## 2015-01-26 DIAGNOSIS — I10 Essential (primary) hypertension: Secondary | ICD-10-CM | POA: Insufficient documentation

## 2015-01-26 DIAGNOSIS — Z792 Long term (current) use of antibiotics: Secondary | ICD-10-CM | POA: Insufficient documentation

## 2015-01-26 DIAGNOSIS — Z87891 Personal history of nicotine dependence: Secondary | ICD-10-CM | POA: Insufficient documentation

## 2015-01-26 DIAGNOSIS — Z791 Long term (current) use of non-steroidal anti-inflammatories (NSAID): Secondary | ICD-10-CM | POA: Insufficient documentation

## 2015-01-26 DIAGNOSIS — N390 Urinary tract infection, site not specified: Secondary | ICD-10-CM

## 2015-01-26 DIAGNOSIS — Z880 Allergy status to penicillin: Secondary | ICD-10-CM | POA: Insufficient documentation

## 2015-01-26 DIAGNOSIS — Z3202 Encounter for pregnancy test, result negative: Secondary | ICD-10-CM | POA: Insufficient documentation

## 2015-01-26 DIAGNOSIS — R109 Unspecified abdominal pain: Secondary | ICD-10-CM

## 2015-01-26 LAB — CBC
HEMATOCRIT: 36 % (ref 35.0–47.0)
Hemoglobin: 11.8 g/dL — ABNORMAL LOW (ref 12.0–16.0)
MCH: 24.5 pg — ABNORMAL LOW (ref 26.0–34.0)
MCHC: 32.9 g/dL (ref 32.0–36.0)
MCV: 74.4 fL — ABNORMAL LOW (ref 80.0–100.0)
Platelets: 307 10*3/uL (ref 150–440)
RBC: 4.84 MIL/uL (ref 3.80–5.20)
RDW: 18.1 % — ABNORMAL HIGH (ref 11.5–14.5)
WBC: 9.4 10*3/uL (ref 3.6–11.0)

## 2015-01-26 LAB — BASIC METABOLIC PANEL
Anion gap: 5 (ref 5–15)
BUN: 14 mg/dL (ref 6–20)
CO2: 30 mmol/L (ref 22–32)
Calcium: 8.9 mg/dL (ref 8.9–10.3)
Chloride: 107 mmol/L (ref 101–111)
Creatinine, Ser: 0.94 mg/dL (ref 0.44–1.00)
GFR calc Af Amer: >60 mL/min (ref >60)
Glucose, Bld: 117 mg/dL — ABNORMAL HIGH (ref 65–99)
POTASSIUM: 3.5 mmol/L (ref 3.5–5.1)
Sodium: 142 mmol/L (ref 135–145)

## 2015-01-26 LAB — URINALYSIS COMPLETE WITH MICROSCOPIC (ARMC ONLY)
Bilirubin Urine: NEGATIVE
GLUCOSE, UA: NEGATIVE mg/dL
Hgb urine dipstick: NEGATIVE
Ketones, ur: NEGATIVE mg/dL
NITRITE: NEGATIVE
Protein, ur: NEGATIVE mg/dL
SPECIFIC GRAVITY, URINE: 1.016 (ref 1.005–1.030)
pH: 5 (ref 5.0–8.0)

## 2015-01-26 LAB — POCT PREGNANCY, URINE: PREG TEST UR: NEGATIVE

## 2015-01-26 MED ORDER — OXYCODONE-ACETAMINOPHEN 5-325 MG PO TABS
1.0000 | ORAL_TABLET | Freq: Four times a day (QID) | ORAL | Status: DC | PRN
Start: 2015-01-26 — End: 2015-10-21

## 2015-01-26 MED ORDER — CIPROFLOXACIN HCL 500 MG PO TABS
500.0000 mg | ORAL_TABLET | Freq: Once | ORAL | Status: AC
  Administered 2015-01-26: 500 mg via ORAL
  Filled 2015-01-26: qty 1

## 2015-01-26 MED ORDER — CIPROFLOXACIN HCL 500 MG PO TABS: 500.0000 mg | ORAL_TABLET | Freq: Two times a day (BID) | ORAL | Status: AC

## 2015-01-26 MED ORDER — OXYCODONE-ACETAMINOPHEN 5-325 MG PO TABS
2.0000 | ORAL_TABLET | Freq: Once | ORAL | Status: AC
  Administered 2015-01-26: 2 via ORAL
  Filled 2015-01-26: qty 2

## 2015-01-26 NOTE — ED Notes (Signed)
Patient ambulatory to triage with steady gait, without difficulty or distress noted; pt reports right flank pain radiating around to right lower abd with no accomp symptoms; st symptoms began Sat am upon awakening; on Friday had to push car out of road

## 2015-01-26 NOTE — Discharge Instructions (Signed)
Please take your pain medication as needed, as prescribed. Please follow-up with her primary care physician 1-2 days for recheck. Return to the emergency department if your abdominal pain worsens, you develop fever, or any other symptoms personally concerning to yourself.   Abdominal Pain, Adult Many things can cause abdominal pain. Usually, abdominal pain is not caused by a disease and will improve without treatment. It can often be observed and treated at home. Your health care provider will do a physical exam and possibly order blood tests and X-rays to help determine the seriousness of your pain. However, in many cases, more time must pass before a clear cause of the pain can be found. Before that point, your health care provider may not know if you need more testing or further treatment. HOME CARE INSTRUCTIONS Monitor your abdominal pain for any changes. The following actions may help to alleviate any discomfort you are experiencing:  Only take over-the-counter or prescription medicines as directed by your health care provider.  Do not take laxatives unless directed to do so by your health care provider.  Try a clear liquid diet (broth, tea, or water) as directed by your health care provider. Slowly move to a bland diet as tolerated. SEEK MEDICAL CARE IF:  You have unexplained abdominal pain.  You have abdominal pain associated with nausea or diarrhea.  You have pain when you urinate or have a bowel movement.  You experience abdominal pain that wakes you in the night.  You have abdominal pain that is worsened or improved by eating food.  You have abdominal pain that is worsened with eating fatty foods.  You have a fever. SEEK IMMEDIATE MEDICAL CARE IF:  Your pain does not go away within 2 hours.  You keep throwing up (vomiting).  Your pain is felt only in portions of the abdomen, such as the right side or the left lower portion of the abdomen.  You pass bloody or black tarry  stools. MAKE SURE YOU:  Understand these instructions.  Will watch your condition.  Will get help right away if you are not doing well or get worse.   This information is not intended to replace advice given to you by your health care provider. Make sure you discuss any questions you have with your health care provider.   Document Released: 11/10/2004 Document Revised: 10/22/2014 Document Reviewed: 10/10/2012 Elsevier Interactive Patient Education 2016 Elsevier Inc.  Urinary Tract Infection A urinary tract infection (UTI) can occur any place along the urinary tract. The tract includes the kidneys, ureters, bladder, and urethra. A type of germ called bacteria often causes a UTI. UTIs are often helped with antibiotic medicine.  HOME CARE   If given, take antibiotics as told by your doctor. Finish them even if you start to feel better.  Drink enough fluids to keep your pee (urine) clear or pale yellow.  Avoid tea, drinks with caffeine, and bubbly (carbonated) drinks.  Pee often. Avoid holding your pee in for a long time.  Pee before and after having sex (intercourse).  Wipe from front to back after you poop (bowel movement) if you are a woman. Use each tissue only once. GET HELP RIGHT AWAY IF:   You have back pain.  You have lower belly (abdominal) pain.  You have chills.  You feel sick to your stomach (nauseous).  You throw up (vomit).  Your burning or discomfort with peeing does not go away.  You have a fever.  Your symptoms are not  better in 3 days. MAKE SURE YOU:   Understand these instructions.  Will watch your condition.  Will get help right away if you are not doing well or get worse.   This information is not intended to replace advice given to you by your health care provider. Make sure you discuss any questions you have with your health care provider.   Document Released: 07/20/2007 Document Revised: 02/21/2014 Document Reviewed: 09/01/2011 Elsevier  Interactive Patient Education [employer]

## 2015-01-26 NOTE — ED Provider Notes (Signed)
[facility] Emergency Department Provider Note  Time seen: 7:38 AM  I have reviewed the triage vital signs and the nursing notes.   HISTORY  Chief Complaint Flank Pain    HPI Yvette Wilson is a 34 y.o. female with a past medical history of hypertension who presents the emergency department right flank pain. According to the patient she had to push her car down the street and out of the street as it had broken down on Friday. She states she felt pretty well Friday evening however Saturday morning was having significant pain in her right flank which worse with any type of movement. States she has been lying in bed for most of the last 2 days due to discomfort with movement. Denies nausea, vomiting, diarrhea, dysuria, hematuria, fever. Patient describes pain as mild to moderate constantly, with severe sharp pains anytime she moves, sits up, or turns.     Past Medical History  Diagnosis Date  . Hypertension     Patient Active Problem List   Diagnosis Date Noted  . Anxiety attack 07/07/2014    Past Surgical History  Procedure Laterality Date  . Appendectomy    . Cesarean section      Current Outpatient Rx  Name  Route  Sig  Dispense  Refill  . clindamycin (CLEOCIN) 150 MG capsule   Oral   Take 3 capsules (450 mg total) by mouth 3 (three) times daily.   90 capsule   0   . cyclobenzaprine (FLEXERIL) 10 MG tablet   Oral   Take 1 tablet (10 mg total) by mouth 2 (two) times daily as needed for muscle spasms.   20 tablet   0   . hydrochlorothiazide (HYDRODIURIL) 25 MG tablet   Oral   Take 1 tablet (25 mg total) by mouth daily.   30 tablet   0   . ibuprofen (ADVIL,MOTRIN) 200 MG tablet   Oral   Take 800 mg by mouth every 6 (six) hours as needed for headache or mild pain.         . naproxen (NAPROSYN) 250 MG tablet   Oral   Take 1 tablet (250 mg total) by mouth 2 (two) times daily with a meal.   40 tablet   0      Allergies Penicillins  No family history on file.  Social History Social History  Substance Use Topics  . Smoking status: Former Games developer  . Smokeless tobacco: None  . Alcohol Use: No    Review of Systems Constitutional: Negative for fever. Cardiovascular: Negative for chest pain. Respiratory: Negative for shortness of breath. Gastrointestinal: Right flank pain. Negative for nausea, vomiting, diarrhea Genitourinary: Negative for dysuria. Negative for hematuria Musculoskeletal: Positive for right mid back pain. Skin: Negative for rash. Neurological: Negative for headache 10-point ROS otherwise negative.  ____________________________________________   PHYSICAL EXAM:  VITAL SIGNS: ED Triage Vitals  Enc Vitals Group     BP 01/26/15 0554 183/97 mmHg     Pulse Rate 01/26/15 0554 75     Resp 01/26/15 0554 18     Temp 01/26/15 0554 98 F (36.7 C)     Temp Source 01/26/15 0554 Oral     SpO2 01/26/15 0554 97 %     Weight 01/26/15 0554 300 lb (136.079 kg)     Height 01/26/15 0554  (1.727 m)     Head Cir --      Peak Flow --      Pain Score  01/26/15 0552 9     Pain Loc --      Pain Edu? --      Excl. in GC? --     Constitutional: Alert and oriented. Well appearing and in no distress. Eyes: Normal exam ENT   Head: Normocephalic and atraumatic.   Mouth/Throat: Mucous membranes are moist. Cardiovascular: Normal rate, regular rhythm. No murmur Respiratory: Normal respiratory effort without tachypnea nor retractions. Breath sounds are clear and equal bilaterally. No wheezes/rales/rhonchi. Gastrointestinal: Soft, mild tenderness to palpation over the right flank, and right back. No significant tenderness. No rebound or guarding. No distention. No CVA tenderness. Musculoskeletal: Nontender with normal range of motion in all extremities. No lower extremity tenderness or edema. Neurologic:  Normal speech and language. No gross focal neurologic deficits  Skin:   Skin is warm, dry and intact.  Psychiatric: Mood and affect are normal. Speech and behavior are normal.   ____________________________________________    INITIAL IMPRESSION / ASSESSMENT AND PLAN / ED COURSE  Pertinent labs & imaging results that were available during my care of the patient were reviewed by me and considered in my medical decision making (see chart for details).  Patient presents the emergency department with right-sided abdominal pain. According to the patient she had to push her car out of the street Friday. Her pain appears very much musculoskeletal on exam, and history. Labs are within normal limits besides a mild urinary tract infection. No CVA tenderness. I discussed with the patient proceeding with a CT scan, versus a trial of home treatment. 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 Given this has been greater than 2 days at this point along with normal labs I believe this is a safe plan of action. We'll place the patient on pain medication, treat her urinary tract infection, and have her follow-up with her primary care physician 1-2 days for recheck. I discussed pressure abdominal pain return precautions to which the patient is agreeable.  ____________________________________________   FINAL CLINICAL IMPRESSION(S) / ED DIAGNOSES  Right abdominal pain Urinary tract infection   Minna AntisKevin Wong [place], MD 01/26/15 [phone]

## 2015-01-26 NOTE — ED Notes (Signed)
C/o right low flank pain past few days, urine has been dark

## 2015-02-12 ENCOUNTER — Encounter: Payer: Self-pay | Admitting: *Deleted

## 2015-02-12 ENCOUNTER — Emergency Department
Admission: EM | Admit: 2015-02-12 | Discharge: 2015-02-12 | Disposition: A | Discharge status: Home / Self Care | Payer: Medicaid Other | Attending: Emergency Medicine | Admitting: Emergency Medicine

## 2015-02-12 DIAGNOSIS — Z880 Allergy status to penicillin: Secondary | ICD-10-CM | POA: Insufficient documentation

## 2015-02-12 DIAGNOSIS — Z79899 Other long term (current) drug therapy: Secondary | ICD-10-CM | POA: Insufficient documentation

## 2015-02-12 DIAGNOSIS — Z791 Long term (current) use of non-steroidal anti-inflammatories (NSAID): Secondary | ICD-10-CM | POA: Insufficient documentation

## 2015-02-12 DIAGNOSIS — J029 Acute pharyngitis, unspecified: Secondary | ICD-10-CM | POA: Insufficient documentation

## 2015-02-12 DIAGNOSIS — Z792 Long term (current) use of antibiotics: Secondary | ICD-10-CM | POA: Insufficient documentation

## 2015-02-12 DIAGNOSIS — I10 Essential (primary) hypertension: Secondary | ICD-10-CM | POA: Insufficient documentation

## 2015-02-12 DIAGNOSIS — Z3202 Encounter for pregnancy test, result negative: Secondary | ICD-10-CM | POA: Insufficient documentation

## 2015-02-12 DIAGNOSIS — Z87891 Personal history of nicotine dependence: Secondary | ICD-10-CM | POA: Insufficient documentation

## 2015-02-12 LAB — CBC WITH DIFFERENTIAL/PLATELET
BASOS ABS: 0.1 10*3/uL (ref 0–0.1)
Basophils Relative: 1 %
Eosinophils Absolute: 0.1 10*3/uL (ref 0–0.7)
Eosinophils Relative: 0 %
HEMATOCRIT: 36.1 % (ref 35.0–47.0)
Hemoglobin: 12.1 g/dL (ref 12.0–16.0)
LYMPHS PCT: 8 %
Lymphs Abs: 1.5 10*3/uL (ref 1.0–3.6)
MCH: 24.6 pg — ABNORMAL LOW (ref 26.0–34.0)
MCHC: 33.7 g/dL (ref 32.0–36.0)
MCV: 73.1 fL — AB (ref 80.0–100.0)
MONO ABS: 1.2 10*3/uL — AB (ref 0.2–0.9)
Monocytes Relative: 7 %
NEUTROS ABS: 15.3 10*3/uL — AB (ref 1.4–6.5)
Neutrophils Relative %: 84 %
Platelets: 314 10*3/uL (ref 150–440)
RBC: 4.93 MIL/uL (ref 3.80–5.20)
RDW: 17.5 % — AB (ref 11.5–14.5)
WBC: 18.2 10*3/uL — ABNORMAL HIGH (ref 3.6–11.0)

## 2015-02-12 LAB — BASIC METABOLIC PANEL
ANION GAP: 9 (ref 5–15)
BUN: 7 mg/dL (ref 6–20)
CO2: 25 mmol/L (ref 22–32)
Calcium: 8.9 mg/dL (ref 8.9–10.3)
Chloride: 105 mmol/L (ref 101–111)
Creatinine, Ser: 0.94 mg/dL (ref 0.44–1.00)
GFR calc Af Amer: >60 mL/min (ref >60)
GFR calc non Af Amer: >60 mL/min (ref >60)
GLUCOSE: 120 mg/dL — AB (ref 65–99)
POTASSIUM: 3.3 mmol/L — AB (ref 3.5–5.1)
Sodium: 139 mmol/L (ref 135–145)

## 2015-02-12 LAB — URINALYSIS COMPLETE WITH MICROSCOPIC (ARMC ONLY)
BACTERIA UA: NONE SEEN
Bilirubin Urine: NEGATIVE
Glucose, UA: NEGATIVE mg/dL
Ketones, ur: NEGATIVE mg/dL
LEUKOCYTES UA: NEGATIVE
Nitrite: NEGATIVE
PH: 5 (ref 5.0–8.0)
Protein, ur: 30 mg/dL — AB
Specific Gravity, Urine: 1.015 (ref 1.005–1.030)

## 2015-02-12 LAB — POCT RAPID STREP A: STREPTOCOCCUS, GROUP A SCREEN (DIRECT): NEGATIVE

## 2015-02-12 LAB — POCT PREGNANCY, URINE: PREG TEST UR: NEGATIVE

## 2015-02-12 LAB — INFLUENZA PANEL BY PCR (TYPE A & B)
H1N1 flu by pcr: NOT DETECTED
Influenza A By PCR: NEGATIVE
Influenza B By PCR: NEGATIVE

## 2015-02-12 MED ORDER — KETOROLAC TROMETHAMINE 30 MG/ML IJ SOLN
30.0000 mg | Freq: Once | INTRAMUSCULAR | Status: AC
  Administered 2015-02-12: 30 mg via INTRAVENOUS

## 2015-02-12 MED ORDER — ONDANSETRON HCL 4 MG/2ML IJ SOLN
INTRAMUSCULAR | Status: AC
  Administered 2015-02-12: 4 mg via INTRAVENOUS
  Filled 2015-02-12: qty 2

## 2015-02-12 MED ORDER — KETOROLAC TROMETHAMINE 30 MG/ML IJ SOLN
INTRAMUSCULAR | Status: AC
  Administered 2015-02-12: 30 mg via INTRAVENOUS
  Filled 2015-02-12: qty 1

## 2015-02-12 MED ORDER — AZITHROMYCIN 250 MG PO TABS: 500.0000 mg | ORAL_TABLET | Freq: Every day | ORAL | Status: AC

## 2015-02-12 MED ORDER — SODIUM CHLORIDE 0.9 % IV BOLUS (SEPSIS)
1000.0000 mL | Freq: Once | INTRAVENOUS | Status: AC
  Administered 2015-02-12: 1000 mL via INTRAVENOUS

## 2015-02-12 MED ORDER — ONDANSETRON HCL 4 MG/2ML IJ SOLN
4.0000 mg | Freq: Once | INTRAMUSCULAR | Status: AC
  Administered 2015-02-12: 4 mg via INTRAVENOUS

## 2015-02-12 MED ORDER — AZITHROMYCIN 250 MG PO TABS
500.0000 mg | ORAL_TABLET | Freq: Once | ORAL | Status: AC
  Administered 2015-02-12: 500 mg via ORAL
  Filled 2015-02-12: qty 2

## 2015-02-12 NOTE — ED Notes (Signed)
Pt brought in via ems from home with body aches, chills, sore throat and vomiting.  No abd pain.  Sx began today.

## 2015-02-12 NOTE — Discharge Instructions (Signed)

## 2015-02-12 NOTE — ED Notes (Signed)
Pt reports dizziness, chills, sore throat, vomiting, and body aches x 15 hours.  Pt denies taking any medications.  Pt states she has not had flu shot this year.  Pt NAD at this time, respirations equal and unlabored, skin warm and dry.

## 2015-02-12 NOTE — ED Provider Notes (Signed)
[facility] Emergency Department Provider Note  ____________________________________________  Time seen: 4:00 AM  I have reviewed the triage vital signs and the nursing notes.   HISTORY  Chief Complaint Generalized Body Aches and Emesis     HPI Yvette Wilson is a 34 y.o. female presents with gradualonset of dizziness sore throat vomiting and generalized myalgia 15 hours. Patient denies cough no dyspnea. Note the patient did not receive a flu shot this year.    Past Medical History  Diagnosis Date  . Hypertension     Patient Active Problem List   Diagnosis Date Noted  . Anxiety attack 07/07/2014    Past Surgical History  Procedure Laterality Date  . Appendectomy    . Cesarean section      Current Outpatient Rx  Name  Route  Sig  Dispense  Refill  . hydrochlorothiazide (HYDRODIURIL) 25 MG tablet   Oral   Take 1 tablet (25 mg total) by mouth daily.   30 tablet   0   . clindamycin (CLEOCIN) 150 MG capsule   Oral   Take 3 capsules (450 mg total) by mouth 3 (three) times daily. Patient not taking: Reported on 02/12/2015   90 capsule   0   . cyclobenzaprine (FLEXERIL) 10 MG tablet   Oral   Take 1 tablet (10 mg total) by mouth 2 (two) times daily as needed for muscle spasms. Patient not taking: Reported on 02/12/2015   20 tablet   0   . ibuprofen (ADVIL,MOTRIN) 200 MG tablet   Oral   Take 800 mg by mouth every 6 (six) hours as needed for headache or mild pain. Reported on 02/12/2015         . naproxen (NAPROSYN) 250 MG tablet   Oral   Take 1 tablet (250 mg total) by mouth 2 (two) times daily with a meal. Patient not taking: Reported on 02/12/2015   40 tablet   0   . oxyCODONE-acetaminophen (ROXICET) 5-325 MG tablet   Oral   Take 1 tablet by mouth every 6 (six) hours as needed. Patient not taking: Reported on 02/12/2015   20 tablet   0     Allergies Penicillins  No family history on file.  Social History Social  History  Substance Use Topics  . Smoking status: Former Games developer  . Smokeless tobacco: None  . Alcohol Use: No    Review of Systems  Constitutional: Positive for fever. Eyes: Negative for visual changes. ENT: Positive for sore throat. Cardiovascular: Negative for chest pain. Respiratory: Negative for shortness of breath. Gastrointestinal: Negative for abdominal pain, vomiting and diarrhea. Genitourinary: Negative for dysuria. Musculoskeletal: Negative for back pain. Positive for generalized myalgia Skin: Negative for rash. Neurological: Negative for headaches, focal weakness or numbness.   10-point ROS otherwise negative.  ____________________________________________   PHYSICAL EXAM:  VITAL SIGNS: ED Triage Vitals  Enc Vitals Group     BP 02/12/15 0148 170/86 mmHg     Pulse Rate 02/12/15 0148 86     Resp 02/12/15 0148 20     Temp 02/12/15 0148 99.2 F (37.3 C)     Temp Source 02/12/15 0148 Oral     SpO2 02/12/15 0148 99 %     Weight 02/12/15 0148 260 lb (117.935 kg)     Height 02/12/15 0148  (1.727 m)     Head Cir --      Peak Flow --      Pain Score 02/12/15 0150 8  Pain Loc --      Pain Edu? --      Excl. in GC? --      Constitutional: Alert and oriented. Well appearing and in no distress. Eyes: Conjunctivae are normal. PERRL. Normal extraocular movements. ENT   Head: Normocephalic and atraumatic.   Nose: No congestion/rhinnorhea.   Mouth/Throat: Mucous membranes are moist. Pharyngeal erythema no exudate noted   Neck: No stridor. Hematological/Lymphatic/Immunilogical: Palpable anterior cervical lymphadenopathy Cardiovascular: Normal rate, regular rhythm. Normal and symmetric distal pulses are present in all extremities. No murmurs, rubs, or gallops. Respiratory: Normal respiratory effort without tachypnea nor retractions. Breath sounds are clear and equal bilaterally. No wheezes/rales/rhonchi. Gastrointestinal: Soft and nontender. No  distention. There is no CVA tenderness. Genitourinary: deferred Musculoskeletal: Nontender with normal range of motion in all extremities. No joint effusions.  No lower extremity tenderness nor edema. Neurologic:  Normal speech and language. No gross focal neurologic deficits are appreciated. Speech is normal.  Skin:  Skin is warm, dry and intact. No rash noted. Psychiatric: Mood and affect are normal. Speech and behavior are normal. Patient exhibits appropriate insight and judgment.  ____________________________________________    LABS (pertinent positives/negatives)  Labs Reviewed  CBC WITH DIFFERENTIAL/PLATELET - Abnormal; Notable for the following:    WBC 18.2 (*)    MCV 73.1 (*)    MCH 24.6 (*)    RDW 17.5 (*)    Neutro Abs 15.3 (*)    Monocytes Absolute 1.2 (*)    All other components within normal limits  BASIC METABOLIC PANEL - Abnormal; Notable for the following:    Potassium 3.3 (*)    Glucose, Bld 120 (*)    All other components within normal limits  URINALYSIS COMPLETEWITH MICROSCOPIC ([facility] ONLY) - Abnormal; Notable for the following:    Color, Urine YELLOW (*)    APPearance HAZY (*)    Hgb urine dipstick 1+ (*)    Protein, ur 30 (*)    Squamous Epithelial / LPF 6-30 (*)    All other components within normal limits  CULTURE, GROUP A STREP ([facility] ONLY)  INFLUENZA PANEL BY PCR (TYPE A & B, H1N1)  POC URINE PREG, ED  POCT PREGNANCY, URINE  POCT RAPID STREP A        INITIAL IMPRESSION / ASSESSMENT AND PLAN / ED COURSE  Pertinent labs & imaging results that were available during my care of the patient were reviewed by me and considered in my medical decision making (see chart for details).   ____________________________________________   FINAL CLINICAL IMPRESSION(S) / ED DIAGNOSES  Final diagnoses:  Acute pharyngitis, unspecified etiology      Darci Currentandolph N Latravia [place]gate, MD 02/12/15 [id number]

## 2015-02-14 LAB — CULTURE, GROUP A STREP (THRC)

## 2015-05-15 ENCOUNTER — Emergency Department
Admission: EM | Admit: 2015-05-15 | Discharge: 2015-05-15 | Disposition: A | Discharge status: Home / Self Care | Payer: Medicaid Other | Attending: Student | Admitting: Student

## 2015-05-15 ENCOUNTER — Encounter: Payer: Self-pay | Admitting: Emergency Medicine

## 2015-05-15 DIAGNOSIS — Z791 Long term (current) use of non-steroidal anti-inflammatories (NSAID): Secondary | ICD-10-CM | POA: Insufficient documentation

## 2015-05-15 DIAGNOSIS — Z760 Encounter for issue of repeat prescription: Secondary | ICD-10-CM | POA: Diagnosis not present

## 2015-05-15 DIAGNOSIS — Z87891 Personal history of nicotine dependence: Secondary | ICD-10-CM | POA: Insufficient documentation

## 2015-05-15 DIAGNOSIS — Z792 Long term (current) use of antibiotics: Secondary | ICD-10-CM | POA: Insufficient documentation

## 2015-05-15 DIAGNOSIS — Z3A08 8 weeks gestation of pregnancy: Secondary | ICD-10-CM | POA: Insufficient documentation

## 2015-05-15 DIAGNOSIS — Z3491 Encounter for supervision of normal pregnancy, unspecified, first trimester: Secondary | ICD-10-CM

## 2015-05-15 DIAGNOSIS — O26891 Other specified pregnancy related conditions, first trimester: Secondary | ICD-10-CM | POA: Insufficient documentation

## 2015-05-15 DIAGNOSIS — Z79899 Other long term (current) drug therapy: Secondary | ICD-10-CM | POA: Diagnosis not present

## 2015-05-15 DIAGNOSIS — O161 Unspecified maternal hypertension, first trimester: Secondary | ICD-10-CM | POA: Diagnosis present

## 2015-05-15 MED ORDER — HYDROCHLOROTHIAZIDE 25 MG PO TABS: 25.0000 mg | ORAL_TABLET | Freq: Every day | ORAL | Status: DC

## 2015-05-15 NOTE — ED Notes (Addendum)
Pt to ed with c/o HTN.  States she went for her [facility] the [facility] today (8 weeks) and was told to come to ed for elevated bp.  Pt reports she should be on bp meds but has not had insurance for several months.  Pt states last dose of bp meds was in Nov 2016. Denies chest pain.

## 2015-05-15 NOTE — Discharge Instructions (Signed)
[facility]

## 2015-05-15 NOTE — ED Notes (Signed)
Patient reports she was at the health department today to get set up so [facility]  They noted her BP was elevated and told her to come to the ED and would not do anything further.  Patient has been off her medication for several months.  She was taking HCTZ 25mg  daily.

## 2015-05-15 NOTE — ED Provider Notes (Signed)
[facility] Emergency Department Provider Note  ____________________________________________  Time seen: Approximately 12:26 PM  I have reviewed the triage vital signs and the nursing notes.   HISTORY  Chief Complaint Hypertension and Medication Refill    HPI Yvette Wilson is a 35 y.o. female patient is a to help Department today for first prenatal evaluation and was found to be hypertensive and was sent to the ER for refill of her medications. She states she has not been on her hypertension medication since October of last she is secondary to lack of insurance. Patient was taking hydrochlorothiazide 25 mg a day. Patient denies any fever headache patient disturbance or vertical with elevated blood pressure. Patient states she became a little anxious today due to the nurse told her that she needs to go to emergency room immediately. No palliative measures taken for this complaint. Patient will be following up with [facility] clinic for continue prenatal care. Patient denies any pain with this complaint. No palliative measures taken prior to arrival.   Past Medical History  Diagnosis Date  . Hypertension     Patient Active Problem List   Diagnosis Date Noted  . Anxiety attack 07/07/2014    Past Surgical History  Procedure Laterality Date  . Appendectomy    . Cesarean section      Current Outpatient Rx  Name  Route  Sig  Dispense  Refill  . clindamycin (CLEOCIN) 150 MG capsule   Oral   Take 3 capsules (450 mg total) by mouth 3 (three) times daily. Patient not taking: Reported on 02/12/2015   90 capsule   0   . cyclobenzaprine (FLEXERIL) 10 MG tablet   Oral   Take 1 tablet (10 mg total) by mouth 2 (two) times daily as needed for muscle spasms. Patient not taking: Reported on 02/12/2015   20 tablet   0   . hydrochlorothiazide (HYDRODIURIL) 25 MG tablet   Oral   Take 1 tablet (25 mg total) by mouth daily.   30 tablet   0   . hydrochlorothiazide  (HYDRODIURIL) 25 MG tablet   Oral   Take 1 tablet (25 mg total) by mouth daily.   30 tablet   0   . ibuprofen (ADVIL,MOTRIN) 200 MG tablet   Oral   Take 800 mg by mouth every 6 (six) hours as needed for headache or mild pain. Reported on 02/12/2015         . naproxen (NAPROSYN) 250 MG tablet   Oral   Take 1 tablet (250 mg total) by mouth 2 (two) times daily with a meal. Patient not taking: Reported on 02/12/2015   40 tablet   0   . oxyCODONE-acetaminophen (ROXICET) 5-325 MG tablet   Oral   Take 1 tablet by mouth every 6 (six) hours as needed. Patient not taking: Reported on 02/12/2015   20 tablet   0     Allergies Penicillins  History reviewed. No pertinent family history.  Social History Social History  Substance Use Topics  . Smoking status: Former Games developer  . Smokeless tobacco: None  . Alcohol Use: No    Review of Systems Constitutional: No fever/chills Eyes: No visual changes. ENT: No sore throat. Cardiovascular: Denies chest pain. Respiratory: Denies shortness of breath. Gastrointestinal: No abdominal pain.  No nausea, no vomiting.  No diarrhea.  No constipation. Genitourinary: Negative for dysuria. Musculoskeletal: Negative for back pain. Skin: Negative for rash. Neurological: Negative for headaches, focal weakness or numbness. Endocrine:Hypertension Allergic/Immunilogical: Penicillin  10-point ROS otherwise negative.  ____________________________________________   PHYSICAL EXAM:  VITAL SIGNS: ED Triage Vitals  Enc Vitals Group     BP 05/15/15 1132 185/92 mmHg     Pulse Rate 05/15/15 1132 62     Resp 05/15/15 1132 18     Temp 05/15/15 1132 98.3 F (36.8 C)     Temp Source 05/15/15 1132 Oral     SpO2 05/15/15 1132 100 %     Weight 05/15/15 1132 303 lb (137.44 kg)     Height 05/15/15 1132 5\' 8"  (1.727 m)     Head Cir --      Peak Flow --      Pain Score 05/15/15 1133 0     Pain Loc --      Pain Edu? --      Excl. in GC? --      Constitutional: Alert and oriented. Well appearing and in no acute distress. Eyes: Conjunctivae are normal. PERRL. EOMI. Head: Atraumatic. Nose: No congestion/rhinnorhea. Mouth/Throat: Mucous membranes are moist.  Oropharynx non-erythematous. Neck: No stridor. No cervical spine tenderness to palpation. Cardiovascular: Normal rate, regular rhythm. Grossly normal heart sounds.  Good peripheral circulation. Elevated blood pressure Respiratory: Normal respiratory effort.  No retractions. Lungs CTAB. Gastrointestinal: Soft and nontender. No distention. No abdominal bruits. No CVA tenderness. Musculoskeletal: No lower extremity tenderness nor edema.  No joint effusions. Neurologic:  Normal speech and language. No gross focal neurologic deficits are appreciated. No gait instability. Skin:  Skin is warm, dry and intact. No rash noted. Psychiatric: Mood and affect are normal. Speech and behavior are normal.  ____________________________________________   LABS (all labs ordered are listed, but only abnormal results are displayed)  Labs Reviewed - No data to display ____________________________________________  EKG   ____________________________________________  RADIOLOGY   ____________________________________________   PROCEDURES  Procedure(s) performed: None  Critical Care performed: No  ____________________________________________   INITIAL IMPRESSION / ASSESSMENT AND PLAN / ED COURSE  Pertinent labs & imaging results that were available during my care of the patient were reviewed by me and considered in my medical decision making (see chart for details).  Medication refill for elevated blood pressure. Patient given discharge care instructions. Patient given a prescription for hydrochlorothiazide 25 mg take once a day pending further evaluation by her prenatal care doctor. ____________________________________________   FINAL CLINICAL IMPRESSION(S) / ED  DIAGNOSES  Final diagnoses:  Encounter for medication refill  First trimester pregnancy      Joni ReiningRonald K Smith, PA-C 05/15/15 1234  Gayla DossEryka A Gayle, MD 05/15/15 1620

## 2015-06-06 ENCOUNTER — Encounter: Payer: Self-pay | Admitting: Emergency Medicine

## 2015-06-06 ENCOUNTER — Emergency Department
Admission: EM | Admit: 2015-06-06 | Discharge: 2015-06-06 | Disposition: A | Discharge status: Home / Self Care | Payer: Medicaid Other | Attending: Emergency Medicine | Admitting: Emergency Medicine

## 2015-06-06 DIAGNOSIS — Z87891 Personal history of nicotine dependence: Secondary | ICD-10-CM | POA: Diagnosis not present

## 2015-06-06 DIAGNOSIS — Z79899 Other long term (current) drug therapy: Secondary | ICD-10-CM | POA: Insufficient documentation

## 2015-06-06 DIAGNOSIS — O26891 Other specified pregnancy related conditions, first trimester: Secondary | ICD-10-CM | POA: Diagnosis not present

## 2015-06-06 DIAGNOSIS — E860 Dehydration: Secondary | ICD-10-CM

## 2015-06-06 DIAGNOSIS — O219 Vomiting of pregnancy, unspecified: Secondary | ICD-10-CM | POA: Diagnosis present

## 2015-06-06 DIAGNOSIS — N390 Urinary tract infection, site not specified: Secondary | ICD-10-CM | POA: Diagnosis not present

## 2015-06-06 DIAGNOSIS — I10 Essential (primary) hypertension: Secondary | ICD-10-CM | POA: Diagnosis not present

## 2015-06-06 DIAGNOSIS — O2341 Unspecified infection of urinary tract in pregnancy, first trimester: Secondary | ICD-10-CM

## 2015-06-06 DIAGNOSIS — Z3A12 12 weeks gestation of pregnancy: Secondary | ICD-10-CM | POA: Diagnosis not present

## 2015-06-06 LAB — URINALYSIS COMPLETE WITH MICROSCOPIC (ARMC ONLY)
Bilirubin Urine: NEGATIVE
GLUCOSE, UA: NEGATIVE mg/dL
Ketones, ur: NEGATIVE mg/dL
Nitrite: NEGATIVE
PROTEIN: 100 mg/dL — AB
SPECIFIC GRAVITY, URINE: 1.02 (ref 1.005–1.030)
pH: 5 (ref 5.0–8.0)

## 2015-06-06 LAB — COMPREHENSIVE METABOLIC PANEL
ALBUMIN: 4.1 g/dL (ref 3.5–5.0)
ALK PHOS: 60 U/L (ref 38–126)
ALT: 75 U/L — AB (ref 14–54)
AST: 56 U/L — AB (ref 15–41)
Anion gap: 12 (ref 5–15)
BUN: 11 mg/dL (ref 6–20)
CALCIUM: 9.3 mg/dL (ref 8.9–10.3)
CHLORIDE: 100 mmol/L — AB (ref 101–111)
CO2: 24 mmol/L (ref 22–32)
CREATININE: 0.92 mg/dL (ref 0.44–1.00)
GFR calc Af Amer: >60 mL/min (ref >60)
GFR calc non Af Amer: >60 mL/min (ref >60)
GLUCOSE: 133 mg/dL — AB (ref 65–99)
Potassium: 3.3 mmol/L — ABNORMAL LOW (ref 3.5–5.1)
SODIUM: 136 mmol/L (ref 135–145)
Total Bilirubin: 0.9 mg/dL (ref 0.3–1.2)
Total Protein: 8.8 g/dL — ABNORMAL HIGH (ref 6.5–8.1)

## 2015-06-06 LAB — HCG, QUANTITATIVE, PREGNANCY: HCG, BETA CHAIN, QUANT, S: 83345 m[IU]/mL — AB (ref <5)

## 2015-06-06 LAB — CBC
HCT: 39 % (ref 35.0–47.0)
Hemoglobin: 12.8 g/dL (ref 12.0–16.0)
MCH: 24 pg — AB (ref 26.0–34.0)
MCHC: 33 g/dL (ref 32.0–36.0)
MCV: 72.8 fL — AB (ref 80.0–100.0)
PLATELETS: 349 10*3/uL (ref 150–440)
RBC: 5.35 MIL/uL — ABNORMAL HIGH (ref 3.80–5.20)
RDW: 18.2 % — ABNORMAL HIGH (ref 11.5–14.5)
WBC: 13.1 10*3/uL — ABNORMAL HIGH (ref 3.6–11.0)

## 2015-06-06 LAB — LIPASE, BLOOD: Lipase: 13 U/L (ref 11–51)

## 2015-06-06 MED ORDER — SODIUM CHLORIDE 0.9 % IV BOLUS (SEPSIS)
1000.0000 mL | Freq: Once | INTRAVENOUS | Status: AC
  Administered 2015-06-06: 1000 mL via INTRAVENOUS

## 2015-06-06 MED ORDER — PRENATAL VITAMINS 0.8 MG PO TABS: 1.0000 | ORAL_TABLET | Freq: Every day | ORAL | Status: DC

## 2015-06-06 MED ORDER — FOSFOMYCIN TROMETHAMINE 3 G PO PACK
3.0000 g | PACK | Freq: Once | ORAL | Status: AC
  Administered 2015-06-06: 3 g via ORAL
  Filled 2015-06-06: qty 3

## 2015-06-06 MED ORDER — ONDANSETRON HCL 4 MG/2ML IJ SOLN
4.0000 mg | Freq: Once | INTRAMUSCULAR | Status: AC
  Administered 2015-06-06: 4 mg via INTRAVENOUS
  Filled 2015-06-06: qty 2

## 2015-06-06 MED ORDER — ONDANSETRON 4 MG PO TBDP: 4.0000 mg | ORAL_TABLET | Freq: Three times a day (TID) | ORAL | Status: DC | PRN

## 2015-06-06 NOTE — Discharge Instructions (Signed)
Dehydration, Adult Dehydration is a condition in which you do not have enough fluid or water in your body. It happens when you take in less fluid than you lose. Vital organs such as the kidneys, brain, and heart cannot function without a proper amount of fluids. Any loss of fluids from the body can cause dehydration.  Dehydration can range from mild to severe. This condition should be treated right away to help prevent it from becoming severe. CAUSES  This condition may be caused by:  Vomiting.  Diarrhea.  Excessive sweating, such as when exercising in hot or humid weather.  Not drinking enough fluid during strenuous exercise or during an illness.  Excessive urine output.  Fever.  Certain medicines. RISK FACTORS This condition is more likely to develop in:  People who are taking certain medicines that cause the body to lose excess fluid (diuretics).   People who have a chronic illness, such as diabetes, that may increase urination.  Older adults.   People who live at high altitudes.   People who participate in endurance sports.  SYMPTOMS  Mild Dehydration  Thirst.  Dry lips.  Slightly dry mouth.  Dry, warm skin. Moderate Dehydration  Very dry mouth.   Muscle cramps.   Dark urine and decreased urine production.   Decreased tear production.   Headache.   Light-headedness, especially when you stand up from a sitting position.  Severe Dehydration  Changes in skin.   Cold and clammy skin.   Skin does not spring back quickly when lightly pinched and released.   Changes in body fluids.   Extreme thirst.   No tears.   Not able to sweat when body temperature is high, such as in hot weather.   Minimal urine production.   Changes in vital signs.   Rapid, weak pulse (more than 100 beats per minute when you are sitting still).   Rapid breathing.   Low blood pressure.   Other changes.   Sunken eyes.   Cold hands and feet.    Confusion.  Lethargy and difficulty being awakened.  Fainting (syncope).   Short-term weight loss.   Unconsciousness. DIAGNOSIS  This condition may be diagnosed based on your symptoms. You may also have tests to determine how severe your dehydration is. These tests may include:   Urine tests.   Blood tests.  TREATMENT  Treatment for this condition depends on the severity. Mild or moderate dehydration can often be treated at home. Treatment should be started right away. Do not wait until dehydration becomes severe. Severe dehydration needs to be treated at the hospital. Treatment for Mild Dehydration  Drinking plenty of water to replace the fluid you have lost.   Replacing minerals in your blood (electrolytes) that you may have lost.  Treatment for Moderate Dehydration  Consuming oral rehydration solution (ORS). Treatment for Severe Dehydration  Receiving fluid through an IV tube.   Receiving electrolyte solution through a feeding tube that is passed through your nose and into your stomach (nasogastric tube or NG tube).  Correcting any abnormalities in electrolytes. HOME CARE INSTRUCTIONS   Drink enough fluid to keep your urine clear or pale yellow.   Drink water or fluid slowly by taking small sips. You can also try sucking on ice cubes.  Have food or beverages that contain electrolytes. Examples include bananas and sports drinks.  Take over-the-counter and prescription medicines only as told by your health care provider.   Prepare ORS according to the manufacturer's instructions. Take sips  of ORS every 5 minutes until your urine returns to normal.  If you have vomiting or diarrhea, continue to try to drink water, ORS, or both.   If you have diarrhea, avoid:   Beverages that contain caffeine.   Fruit juice.   Milk.   Carbonated soft drinks.  Do not take salt tablets. This can lead to the condition of having too much sodium in your body  (hypernatremia).  SEEK MEDICAL CARE IF:  You cannot eat or drink without vomiting.  You have had moderate diarrhea during a period of more than 24 hours.  You have a fever. SEEK IMMEDIATE MEDICAL CARE IF:   You have extreme thirst.  You have severe diarrhea.  You have not urinated in 6-8 hours, or you have urinated only a small amount of very dark urine.  You have shriveled skin.  You are dizzy, confused, or both.   This information is not intended to replace advice given to you by your health care provider. Make sure you discuss any questions you have with your health care provider.   Document Released: 01/31/2005 Document Revised: 10/22/2014 Document Reviewed: 06/18/2014 Elsevier Interactive Patient Education 2016 Elsevier Inc[employer]  Pregnancy and Urinary Tract Infection A urinary tract infection (UTI) is a bacterial infection of the urinary tract. Infection of the urinary tract can include the ureters, kidneys (pyelonephritis), bladder (cystitis), and urethra (urethritis). All pregnant women should be screened for bacteria in the urinary tract. Identifying and treating a UTI will decrease the risk of preterm labor and developing more serious infections in both the mother and baby. CAUSES Bacteria germs cause almost all UTIs.  RISK FACTORS Many factors can increase your chances of getting a UTI during pregnancy. These include:  Having a short urethra.  Poor toilet and hygiene habits.  Sexual intercourse.  Blockage of urine along the urinary tract.  Problems with the pelvic muscles or nerves.  Diabetes.  Obesity.  Bladder problems after having several children.  Previous history of UTI. SIGNS AND SYMPTOMS   Pain, burning, or a stinging feeling when urinating.  Suddenly feeling the need to urinate right away (urgency).  Loss of bladder control (urinary incontinence).  Frequent urination, more than is common with pregnancy.  Lower abdominal or back  discomfort.  Cloudy urine.  Blood in the urine (hematuria).  Fever. When the kidneys are infected, the symptoms may be:  Back pain.  Flank pain on the right side more so than the left.  Fever.  Chills.  Nausea.  Vomiting. DIAGNOSIS  A urinary tract infection is usually diagnosed through urine tests. Additional tests and procedures are sometimes done. These may include:  Ultrasound exam of the kidneys, ureters, bladder, and urethra.  Looking in the bladder with a lighted tube (cystoscopy). TREATMENT Typically, UTIs can be treated with antibiotic medicines.  HOME CARE INSTRUCTIONS   Only take over-the-counter or prescription medicines as directed by your health care provider. If you were prescribed antibiotics, take them as directed. Finish them even if you start to feel better.  Drink enough fluids to keep your urine clear or pale yellow.  Do not have sexual intercourse until the infection is gone and your health care provider says it is okay.  Make sure you are tested for UTIs throughout your pregnancy. These infections often come back. Preventing a UTI in the Future  Practice good toilet habits. Always wipe from front to back. Use the tissue only once.  Do not hold your urine. Empty your bladder as  soon as possible when the urge comes.  Do not douche or use deodorant sprays.  Wash with soap and warm water around the genital area and the anus.  Empty your bladder before and after sexual intercourse.  Wear underwear with a cotton crotch.  Avoid caffeine and carbonated drinks. They can irritate the bladder.  Drink cranberry juice or take cranberry pills. This may decrease the risk of getting a UTI.  Do not drink alcohol.  Keep all your appointments and tests as scheduled. SEEK MEDICAL CARE IF:   Your symptoms get worse.  You are still having fevers 2 or more days after treatment begins.  You have a rash.  You feel that you are having problems with  medicines prescribed.  You have abnormal vaginal discharge. SEEK IMMEDIATE MEDICAL CARE IF:   You have back or flank pain.  You have chills.  You have blood in your urine.  You have nausea and vomiting.  You have contractions of your uterus.  You have a gush of fluid from the vagina. MAKE SURE YOU:  Understand these instructions.   Will watch your condition.   Will get help right away if you are not doing well or get worse.    This information is not intended to replace advice given to you by your health care provider. Make sure you discuss any questions you have with your health care provider.   Document Released: 05/28/2010 Document Revised: 11/21/2012 Document Reviewed: 08/30/2012 Elsevier Interactive Patient Education [employer]

## 2015-06-06 NOTE — ED Provider Notes (Signed)
[facility] Emergency Department Provider Note  ____________________________________________  Time seen: 3:50 PM  I have reviewed the triage vital signs and the nursing notes.   HISTORY  Chief Complaint Emesis During Pregnancy    HPI Yvette Wilson is a 35 y.o. female who complains of nausea and vomiting and oral intolerance for the last 2-3 days. She is [redacted] weeks pregnant. Only urinated twice yesterday. Denies dysuria length pain and hematuria. No fevers or chills.No vaginal bleeding or cramping or leakage of fluid. No headaches or syncope.     Past Medical History  Diagnosis Date  . Hypertension      Patient Active Problem List   Diagnosis Date Noted  . Anxiety attack 07/07/2014     Past Surgical History  Procedure Laterality Date  . Appendectomy    . Cesarean section       Current Outpatient Rx  Name  Route  Sig  Dispense  Refill  . clindamycin (CLEOCIN) 150 MG capsule   Oral   Take 3 capsules (450 mg total) by mouth 3 (three) times daily. Patient not taking: Reported on 02/12/2015   90 capsule   0   . cyclobenzaprine (FLEXERIL) 10 MG tablet   Oral   Take 1 tablet (10 mg total) by mouth 2 (two) times daily as needed for muscle spasms. Patient not taking: Reported on 02/12/2015   20 tablet   0   . hydrochlorothiazide (HYDRODIURIL) 25 MG tablet   Oral   Take 1 tablet (25 mg total) by mouth daily.   30 tablet   0   . hydrochlorothiazide (HYDRODIURIL) 25 MG tablet   Oral   Take 1 tablet (25 mg total) by mouth daily.   30 tablet   0   . ibuprofen (ADVIL,MOTRIN) 200 MG tablet   Oral   Take 800 mg by mouth every 6 (six) hours as needed for headache or mild pain. Reported on 02/12/2015         . naproxen (NAPROSYN) 250 MG tablet   Oral   Take 1 tablet (250 mg total) by mouth 2 (two) times daily with a meal. Patient not taking: Reported on 02/12/2015   40 tablet   0   . ondansetron (ZOFRAN ODT) 4 MG disintegrating  tablet   Oral   Take 1 tablet (4 mg total) by mouth every 8 (eight) hours as needed for nausea or vomiting.   20 tablet   0   . oxyCODONE-acetaminophen (ROXICET) 5-325 MG tablet   Oral   Take 1 tablet by mouth every 6 (six) hours as needed. Patient not taking: Reported on 02/12/2015   20 tablet   0   . Prenatal Multivit-Min-Fe-FA (PRENATAL VITAMINS) 0.8 MG tablet   Oral   Take 1 tablet by mouth daily.   90 tablet   3      Allergies Penicillins   No family history on file.  Social History Social History  Substance Use Topics  . Smoking status: Former Games developermoker  . Smokeless tobacco: None  . Alcohol Use: No    Review of Systems  Constitutional:   No fever or chills.  Eyes:   No vision changes.  ENT:   No sore throat. No rhinorrhea. Cardiovascular:   No chest pain. Respiratory:   No dyspnea or cough. Gastrointestinal:   Negative for abdominal pain, Positive vomiting no diarrhea.  No bloody stool. Genitourinary:   Negative for dysuria or difficulty urinating. Musculoskeletal:   Negative for focal pain  or swelling Neurological:   Negative for headaches 10-point ROS otherwise negative.  ____________________________________________   PHYSICAL EXAM:  VITAL SIGNS: ED Triage Vitals  Enc Vitals Group     BP 06/06/15 1325 146/87 mmHg     Pulse Rate 06/06/15 1325 82     Resp 06/06/15 1325 18     Temp 06/06/15 1325 98.9 F (37.2 C)     Temp Source 06/06/15 1325 Oral     SpO2 06/06/15 1325 100 %     Weight 06/06/15 1325 293 lb (132.904 kg)     Height 06/06/15 1325  (1.753 m)     Head Cir --      Peak Flow --      Pain Score 06/06/15 1325 8     Pain Loc --      Pain Edu? --      Excl. in GC? --     Vital signs reviewed, nursing assessments reviewed.   Constitutional:   Alert and oriented. Well appearing and in no distress. Eyes:   No scleral icterus. No conjunctival pallor. PERRL. EOMI ENT   Head:   Normocephalic and atraumatic.   Nose:   No  congestion/rhinnorhea. No septal hematoma   Mouth/Throat:   MMM, no pharyngeal erythema. No peritonsillar mass.    Neck:   No stridor. No SubQ emphysema. No meningismus. Hematological/Lymphatic/Immunilogical:   No cervical lymphadenopathy. Cardiovascular:   RRR. Symmetric bilateral radial and DP pulses.  No murmurs.  Respiratory:   Normal respiratory effort without tachypnea nor retractions. Breath sounds are clear and equal bilaterally. No wheezes/rales/rhonchi. Gastrointestinal:   Soft With suprapubic tenderness. Non distended. There is no CVA tenderness.  No rebound, rigidity, or guarding. Genitourinary:   deferred Musculoskeletal:   Nontender with normal range of motion in all extremities. No joint effusions.  No lower extremity tenderness.  No edema. Neurologic:   Normal speech and language.  CN 2-10 normal. Motor grossly intact. No gross focal neurologic deficits are appreciated.  Skin:    Skin is warm, dry and intact. No rash noted.  No petechiae, purpura, or bullae.  ____________________________________________    LABS (pertinent positives/negatives) (all labs ordered are listed, but only abnormal results are displayed) Labs Reviewed  COMPREHENSIVE METABOLIC PANEL - Abnormal; Notable for the following:    Potassium 3.3 (*)    Chloride 100 (*)    Glucose, Bld 133 (*)    Total Protein 8.8 (*)    AST 56 (*)    ALT 75 (*)    All other components within normal limits  CBC - Abnormal; Notable for the following:    WBC 13.1 (*)    RBC 5.35 (*)    MCV 72.8 (*)    MCH 24.0 (*)    RDW 18.2 (*)    All other components within normal limits  URINALYSIS COMPLETEWITH MICROSCOPIC ([facility] ONLY) - Abnormal; Notable for the following:    Color, Urine AMBER (*)    APPearance TURBID (*)    Hgb urine dipstick 1+ (*)    Protein, ur 100 (*)    Leukocytes, UA 3+ (*)    Bacteria, UA MANY (*)    Squamous Epithelial / LPF TOO NUMEROUS TO COUNT (*)    All other components within normal  limits  HCG, QUANTITATIVE, PREGNANCY - Abnormal; Notable for the following:    hCG, Beta Chain, Quant, S [id number] (*)    All other components within normal limits  LIPASE, BLOOD   ____________________________________________   EKG  ____________________________________________    RADIOLOGY    ____________________________________________   PROCEDURES   ____________________________________________   INITIAL IMPRESSION / ASSESSMENT AND PLAN / ED COURSE  Pertinent labs & imaging results that were available during my care of the patient were reviewed by me and considered in my medical decision making (see chart for details).  Patient well appearing no acute distress. Clinically appears to be mildly dehydrated. Also has a definite urinary tract infection based on exam and urinalysis. Fosfomycin given in the ED, patient nausea controlled after rehydration and antiemetics, tolerating oral intake. We'll continue antiemetics and prenatal vitamins and have her follow up with obstetrics. No evidence of sepsis or pyelonephritis at this time. Low suspicion for kidney stone     ____________________________________________   FINAL CLINICAL IMPRESSION(S) / ED DIAGNOSES  Final diagnoses:  Urinary tract infection during pregnancy in first trimester  Dehydration       Portions of this note were generated with dragon dictation software. Dictation errors may occur despite best attempts at proofreading.   Sharman Cheek, MD 06/06/15 1726

## 2015-06-06 NOTE — ED Notes (Signed)
Discussed discharge instructions, prescriptions, and follow-up care with patient. No questions or concerns at this time. Pt stable at discharge.  

## 2015-06-06 NOTE — ED Notes (Signed)
Lab results reviewed. Awaiting room for MD eval.  

## 2015-06-06 NOTE — ED Notes (Signed)
Patient presents to the ED with constant nausea and vomiting for several days.  Patient reports that she is about [redacted] weeks pregnant and she is concerned because she has not been able to keep very much fluid down and she only urinated twice yesterday.  Patient reports she was put on hypertension medication but states she has not been able to keep it down.  Patient is in no obvious distress at this time.

## 2015-06-16 ENCOUNTER — Emergency Department
Admission: EM | Admit: 2015-06-16 | Discharge: 2015-06-16 | Disposition: A | Discharge status: Home / Self Care | Payer: Medicaid Other | Attending: Emergency Medicine | Admitting: Emergency Medicine

## 2015-06-16 ENCOUNTER — Emergency Department: Payer: Medicaid Other

## 2015-06-16 DIAGNOSIS — Z792 Long term (current) use of antibiotics: Secondary | ICD-10-CM | POA: Insufficient documentation

## 2015-06-16 DIAGNOSIS — Z3A11 11 weeks gestation of pregnancy: Secondary | ICD-10-CM | POA: Diagnosis not present

## 2015-06-16 DIAGNOSIS — I10 Essential (primary) hypertension: Secondary | ICD-10-CM | POA: Insufficient documentation

## 2015-06-16 DIAGNOSIS — Z791 Long term (current) use of non-steroidal anti-inflammatories (NSAID): Secondary | ICD-10-CM | POA: Insufficient documentation

## 2015-06-16 DIAGNOSIS — O26899 Other specified pregnancy related conditions, unspecified trimester: Secondary | ICD-10-CM

## 2015-06-16 DIAGNOSIS — O2341 Unspecified infection of urinary tract in pregnancy, first trimester: Secondary | ICD-10-CM | POA: Diagnosis not present

## 2015-06-16 DIAGNOSIS — R109 Unspecified abdominal pain: Secondary | ICD-10-CM

## 2015-06-16 DIAGNOSIS — Z79899 Other long term (current) drug therapy: Secondary | ICD-10-CM | POA: Insufficient documentation

## 2015-06-16 DIAGNOSIS — Z87891 Personal history of nicotine dependence: Secondary | ICD-10-CM | POA: Insufficient documentation

## 2015-06-16 DIAGNOSIS — R1030 Lower abdominal pain, unspecified: Secondary | ICD-10-CM | POA: Diagnosis present

## 2015-06-16 LAB — BASIC METABOLIC PANEL
ANION GAP: 9 (ref 5–15)
BUN: 6 mg/dL (ref 6–20)
CHLORIDE: 103 mmol/L (ref 101–111)
CO2: 23 mmol/L (ref 22–32)
Calcium: 9 mg/dL (ref 8.9–10.3)
Creatinine, Ser: 0.66 mg/dL (ref 0.44–1.00)
GFR calc non Af Amer: >60 mL/min (ref >60)
Glucose, Bld: 95 mg/dL (ref 65–99)
POTASSIUM: 3.4 mmol/L — AB (ref 3.5–5.1)
SODIUM: 135 mmol/L (ref 135–145)

## 2015-06-16 LAB — URINALYSIS COMPLETE WITH MICROSCOPIC (ARMC ONLY)
Bilirubin Urine: NEGATIVE
Glucose, UA: NEGATIVE mg/dL
KETONES UR: NEGATIVE mg/dL
NITRITE: NEGATIVE
PH: 5 (ref 5.0–8.0)
PROTEIN: NEGATIVE mg/dL
SPECIFIC GRAVITY, URINE: 1.014 (ref 1.005–1.030)

## 2015-06-16 LAB — CBC
HEMATOCRIT: 36.9 % (ref 35.0–47.0)
Hemoglobin: 12 g/dL (ref 12.0–16.0)
MCH: 24.5 pg — ABNORMAL LOW (ref 26.0–34.0)
MCHC: 32.6 g/dL (ref 32.0–36.0)
MCV: 75.1 fL — ABNORMAL LOW (ref 80.0–100.0)
Platelets: 272 10*3/uL (ref 150–440)
RBC: 4.91 MIL/uL (ref 3.80–5.20)
RDW: 18.3 % — ABNORMAL HIGH (ref 11.5–14.5)
WBC: 11.9 10*3/uL — ABNORMAL HIGH (ref 3.6–11.0)

## 2015-06-16 LAB — HCG, QUANTITATIVE, PREGNANCY: hCG, Beta Chain, Quant, S: 52627 m[IU]/mL — ABNORMAL HIGH (ref <5)

## 2015-06-16 MED ORDER — CEFTRIAXONE SODIUM 1 G IJ SOLR
INTRAMUSCULAR | Status: AC
  Administered 2015-06-16: 1 g via INTRAMUSCULAR
  Filled 2015-06-16: qty 10

## 2015-06-16 MED ORDER — CEPHALEXIN 500 MG PO CAPS: 500.0000 mg | ORAL_CAPSULE | Freq: Three times a day (TID) | ORAL | Status: DC

## 2015-06-16 MED ORDER — CEFTRIAXONE SODIUM 1 G IJ SOLR
1.0000 g | Freq: Once | INTRAMUSCULAR | Status: AC
  Administered 2015-06-16: 1 g via INTRAMUSCULAR

## 2015-06-16 NOTE — ED Notes (Signed)
Pt returned from ultrasound

## 2015-06-16 NOTE — ED Notes (Signed)
Pt reports constant and cramping with back pain, reports she is pregnant. Denies vaginal bleeding

## 2015-06-16 NOTE — ED Notes (Signed)
Pt in with co lower abd pain and low back pain since today.  Pt is [redacted] weeks pregnant denies any vag bleeding or dysuria.

## 2015-06-16 NOTE — Discharge Instructions (Signed)
You have been seen in the Emergency Department (ED) for abdominal cramping.  Your evaluation did not identify a clear cause of your symptoms but was generally reassuring.  Most likely the discomfort is due to a urinary tract infection.  We sent a culture to the lab to determine the type of bacteria that is growing, and we gave you an intramuscular dose of antibiotics (ceftriaxone 1 g) as well as a prescription.  Please take the full course of antibiotics as prescribed until they are gone.  Please follow up as instructed above regarding todays emergent visit and the symptoms that are bothering you.  Return to the ED if your abdominal pain worsens or fails to improve, you develop bloody vomiting, bloody diarrhea, vaginal bleeding, you are unable to tolerate fluids due to vomiting, fever greater than 101, or other symptoms that concern you.   Pregnancy and Urinary Tract Infection A urinary tract infection (UTI) is a bacterial infection of the urinary tract. Infection of the urinary tract can include the ureters, kidneys (pyelonephritis), bladder (cystitis), and urethra (urethritis). All pregnant women should be screened for bacteria in the urinary tract. Identifying and treating a UTI will decrease the risk of preterm labor and developing more serious infections in both the mother and baby. CAUSES Bacteria germs cause almost all UTIs.  RISK FACTORS Many factors can increase your chances of getting a UTI during pregnancy. These include:  Having a short urethra.  Poor toilet and hygiene habits.  Sexual intercourse.  Blockage of urine along the urinary tract.  Problems with the pelvic muscles or nerves.  Diabetes.  Obesity.  Bladder problems after having several children.  Previous history of UTI. SIGNS AND SYMPTOMS   Pain, burning, or a stinging feeling when urinating.  Suddenly feeling the need to urinate right away (urgency).  Loss of bladder control (urinary  incontinence).  Frequent urination, more than is common with pregnancy.  Lower abdominal or back discomfort.  Cloudy urine.  Blood in the urine (hematuria).  Fever. When the kidneys are infected, the symptoms may be:  Back pain.  Flank pain on the right side more so than the left.  Fever.  Chills.  Nausea.  Vomiting. DIAGNOSIS  A urinary tract infection is usually diagnosed through urine tests. Additional tests and procedures are sometimes done. These may include:  Ultrasound exam of the kidneys, ureters, bladder, and urethra.  Looking in the bladder with a lighted tube (cystoscopy). TREATMENT Typically, UTIs can be treated with antibiotic medicines.  HOME CARE INSTRUCTIONS   Only take over-the-counter or prescription medicines as directed by your health care provider. If you were prescribed antibiotics, take them as directed. Finish them even if you start to feel better.  Drink enough fluids to keep your urine clear or pale yellow.  Do not have sexual intercourse until the infection is gone and your health care provider says it is okay.  Make sure you are tested for UTIs throughout your pregnancy. These infections often come back. Preventing a UTI in the Future  Practice good toilet habits. Always wipe from front to back. Use the tissue only once.  Do not hold your urine. Empty your bladder as soon as possible when the urge comes.  Do not douche or use deodorant sprays.  Wash with soap and warm water around the genital area and the anus.  Empty your bladder before and after sexual intercourse.  Wear underwear with a cotton crotch.  Avoid caffeine and carbonated drinks. They can irritate the  bladder.  Drink cranberry juice or take cranberry pills. This may decrease the risk of getting a UTI.  Do not drink alcohol.  Keep all your appointments and tests as scheduled. SEEK MEDICAL CARE IF:   Your symptoms get worse.  You are still having fevers 2 or  more days after treatment begins.  You have a rash.  You feel that you are having problems with medicines prescribed.  You have abnormal vaginal discharge. SEEK IMMEDIATE MEDICAL CARE IF:   You have back or flank pain.  You have chills.  You have blood in your urine.  You have nausea and vomiting.  You have contractions of your uterus.  You have a gush of fluid from the vagina. MAKE SURE YOU:  Understand these instructions.   Will watch your condition.   Will get help right away if you are not doing well or get worse.    This information is not intended to replace advice given to you by your health care provider. Make sure you discuss any questions you have with your health care provider.   Document Released: 05/28/2010 Document Revised: 11/21/2012 Document Reviewed: 08/30/2012 Elsevier Interactive Patient Education [employer]

## 2015-06-16 NOTE — ED Notes (Signed)
Pt unable to give urine sample at this time; pt has specimen cup. 

## 2015-06-16 NOTE — ED Provider Notes (Signed)
[facility] Emergency Department Provider Note  ____________________________________________  Time seen: Approximately 9:12 PM  I have reviewed the triage vital signs and the nursing notes.   HISTORY  Chief Complaint Abdominal Cramping    HPI Yvette Wilson is a 35 y.o. female G4 P3 at approximately [redacted] weeks gestation who goes to [facility] for her GYN care who presents for evaluation of acute onset severe lower abdominal cramping.  She reports that they are essentially constant but waxing and waning in severity for moderate to severe.  There radiates through to her lower back.  She denies fever/chills, chest pain, shortness of breath, nausea, vomiting, diarrhea, dysuria, vaginal bleeding, vaginal discharge.  She has previously had her appendix removed.  She has had no complications thus far with this pregnancy.  She has an appointment scheduled with the OB clinic in 3 days.   Past Medical History  Diagnosis Date  . Hypertension     Patient Active Problem List   Diagnosis Date Noted  . Anxiety attack 07/07/2014    Past Surgical History  Procedure Laterality Date  . Appendectomy    . Cesarean section      Current Outpatient Rx  Name  Route  Sig  Dispense  Refill  . cephALEXin (KEFLEX) 500 MG capsule   Oral   Take 1 capsule (500 mg total) by mouth 3 (three) times daily.   36 capsule   0   . clindamycin (CLEOCIN) 150 MG capsule   Oral   Take 3 capsules (450 mg total) by mouth 3 (three) times daily. Patient not taking: Reported on 02/12/2015   90 capsule   0   . cyclobenzaprine (FLEXERIL) 10 MG tablet   Oral   Take 1 tablet (10 mg total) by mouth 2 (two) times daily as needed for muscle spasms. Patient not taking: Reported on 02/12/2015   20 tablet   0   . hydrochlorothiazide (HYDRODIURIL) 25 MG tablet   Oral   Take 1 tablet (25 mg total) by mouth daily.   30 tablet   0   . hydrochlorothiazide (HYDRODIURIL) 25 MG tablet   Oral  Take 1 tablet (25 mg total) by mouth daily.   30 tablet   0   . ibuprofen (ADVIL,MOTRIN) 200 MG tablet   Oral   Take 800 mg by mouth every 6 (six) hours as needed for headache or mild pain. Reported on 02/12/2015         . naproxen (NAPROSYN) 250 MG tablet   Oral   Take 1 tablet (250 mg total) by mouth 2 (two) times daily with a meal. Patient not taking: Reported on 02/12/2015   40 tablet   0   . ondansetron (ZOFRAN ODT) 4 MG disintegrating tablet   Oral   Take 1 tablet (4 mg total) by mouth every 8 (eight) hours as needed for nausea or vomiting.   20 tablet   0   . oxyCODONE-acetaminophen (ROXICET) 5-325 MG tablet   Oral   Take 1 tablet by mouth every 6 (six) hours as needed. Patient not taking: Reported on 02/12/2015   20 tablet   0   . Prenatal Multivit-Min-Fe-FA (PRENATAL VITAMINS) 0.8 MG tablet   Oral   Take 1 tablet by mouth daily.   90 tablet   3     Allergies Penicillins  No family history on file.  Social History Social History  Substance Use Topics  . Smoking status: Former Games developer  . Smokeless  tobacco: Not on file  . Alcohol Use: No    Review of Systems Constitutional: No fever/chills Eyes: No visual changes. ENT: No sore throat. Cardiovascular: Denies chest pain. Respiratory: Denies shortness of breath. Gastrointestinal: Severe lower abdominal cramps.  Denies N/V/D Genitourinary: Negative for dysuria.  No vaginal bleeding Musculoskeletal: Negative for back pain. Skin: Negative for rash. Neurological: Negative for headaches, focal weakness or numbness.  10-point ROS otherwise negative.  ____________________________________________   PHYSICAL EXAM:  VITAL SIGNS: ED Triage Vitals  Enc Vitals Group     BP 06/16/15 1929 140/80 mmHg     Pulse Rate 06/16/15 1929 83     Resp 06/16/15 1929 18     Temp 06/16/15 1929 98.2 F (36.8 C)     Temp Source 06/16/15 1929 Oral     SpO2 06/16/15 1929 100 %     Weight 06/16/15 1929 295 lb (133.811  kg)     Height 06/16/15 1929 5\' 8"  (1.727 m)     Head Cir --      Peak Flow --      Pain Score 06/16/15 1929 7     Pain Loc --      Pain Edu? --      Excl. in GC? --     Constitutional: Alert and oriented. Well appearing and in no acute distress. Eyes: Conjunctivae are normal. PERRL. EOMI. Head: Atraumatic. Nose: No congestion/rhinnorhea. Mouth/Throat: Mucous membranes are moist.  Oropharynx non-erythematous. Neck: No stridor.  No meningeal signs.   Cardiovascular: Normal rate, regular rhythm. Good peripheral circulation. Grossly normal heart sounds.   Respiratory: Normal respiratory effort.  No retractions. Lungs CTAB. Gastrointestinal: Obese.  Soft with tenderness to palpation in suprapubic region Genitourinary:  Musculoskeletal: No lower extremity tenderness nor edema. No gross deformities of extremities. Neurologic:  Normal speech and language. No gross focal neurologic deficits are appreciated.  Skin:  Skin is warm, dry and intact. No rash noted. Psychiatric: Mood and affect are normal. Speech and behavior are normal.  ____________________________________________   LABS (all labs ordered are listed, but only abnormal results are displayed)  Labs Reviewed  CBC - Abnormal; Notable for the following:    WBC 11.9 (*)    MCV 75.1 (*)    MCH 24.5 (*)    RDW 18.3 (*)    All other components within normal limits  BASIC METABOLIC PANEL - Abnormal; Notable for the following:    Potassium 3.4 (*)    All other components within normal limits  URINALYSIS COMPLETEWITH MICROSCOPIC ([facility] ONLY) - Abnormal; Notable for the following:    Color, Urine YELLOW (*)    APPearance CLOUDY (*)    Hgb urine dipstick 1+ (*)    Leukocytes, UA 3+ (*)    Bacteria, UA FEW (*)    Squamous Epithelial / LPF 6-30 (*)    All other components within normal limits  HCG, QUANTITATIVE, PREGNANCY - Abnormal; Notable for the following:    hCG, Beta Chain, Quant, S [id number] (*)    All other components within  normal limits  URINE CULTURE   ____________________________________________  EKG  None ____________________________________________  RADIOLOGY   [place] Ob Comp Less 14 Wks  06/16/2015  CLINICAL DATA:  Lower abdominal pain for 1 day. No vaginal bleeding. EXAM: OBSTETRIC <14 WK US AND TRANSVAGINAL OB US TECHNIQUE: Both transabdominal and transvaginal ultrasound examinations were performed for complete evaluation of the gestation as well as the maternal uterus, adnexal regions, and pelvic cul-de-sac. Transvaginal technique was performed to assess  early pregnancy. COMPARISON:  None. FINDINGS: Intrauterine gestational sac: Single Yolk sac:  No Embryo:  Yes Cardiac Activity: Yes Heart Rate: 169  bpm MSD:   mm    w     d CRL:  49.6  mm   11 w   5 d                  [place] EDC: 12/31/2015 Subchorionic hemorrhage:  None visualized. Maternal uterus/adnexae: Normal right ovary. Nonvisualization of the left ovary. No free pelvic fluid. IMPRESSION: Single living intrauterine gestation measuring 11 weeks 5 days by crown-rump length. Electronically Signed   By: Ellery Plunk M.D.   On: 06/16/2015 22:32   US Ob Transvaginal  06/16/2015  CLINICAL DATA:  Lower abdominal pain for 1 day. No vaginal bleeding. EXAM: OBSTETRIC <14 WK [place] AND TRANSVAGINAL OB US TECHNIQUE: Both transabdominal and transvaginal ultrasound examinations were performed for complete evaluation of the gestation as well as the maternal uterus, adnexal regions, and pelvic cul-de-sac. Transvaginal technique was performed to assess early pregnancy. COMPARISON:  None. FINDINGS: Intrauterine gestational sac: Single Yolk sac:  No Embryo:  Yes Cardiac Activity: Yes Heart Rate: 169  bpm MSD:   mm    w     d CRL:  49.6  mm   11 w   5 d                  [place] EDC: 12/31/2015 Subchorionic hemorrhage:  None visualized. Maternal uterus/adnexae: Normal right ovary. Nonvisualization of the left ovary. No free pelvic fluid. IMPRESSION: Single living intrauterine gestation  measuring 11 weeks 5 days by crown-rump length. Electronically Signed   By: Ellery Plunk M.D.   On: 06/16/2015 22:32    ____________________________________________   PROCEDURES  Procedure(s) performed: None  Critical Care performed: No ____________________________________________   INITIAL IMPRESSION / ASSESSMENT AND PLAN / ED COURSE  Pertinent labs & imaging results that were available during my care of the patient were reviewed by me and considered in my medical decision making (see chart for details).  Reassuring U/S.  Patient appears to have a bad UTI.  Similar to prior UA, but no urine culture to review.  Patient reports she was given a dose of something to drink which she was told would cure the last infection.  This time I will treat her with ceftriaxone 1 g IM and a course of Keflex appropriate for pyelonephritis.  However, she is well-appearing, ambulating without difficult, not tachycardiac, and afebrile, and I do not believe she needs additional workup at this time.  She has an appointment with OB in 3 days.    I gave my usual and customary return precautions.      ____________________________________________  FINAL CLINICAL IMPRESSION(S) / ED DIAGNOSES  Final diagnoses:  UTI (urinary tract infection) during pregnancy, first trimester  Abdominal cramping affecting pregnancy     MEDICATIONS GIVEN DURING THIS VISIT:  Medications  cefTRIAXone (ROCEPHIN) injection 1 g (1 g Intramuscular Given 06/16/15 2249)     NEW OUTPATIENT MEDICATIONS STARTED DURING THIS VISIT:  Discharge Medication List as of 06/16/2015 10:51 PM    START taking these medications   Details  cephALEXin (KEFLEX) 500 MG capsule Take 1 capsule (500 mg total) by mouth 3 (three) times daily., Starting 06/16/2015, Until Discontinued, Print          Note:  This document was prepared using Dragon voice recognition software and may include unintentional dictation errors.   Loleta Rose,  MD  06/16/15 2321 

## 2015-06-19 LAB — OB RESULTS CONSOLE ABO/RH: RH Type: POSITIVE

## 2015-06-19 LAB — OB RESULTS CONSOLE RPR: RPR: NONREACTIVE

## 2015-06-19 LAB — OB RESULTS CONSOLE VARICELLA ZOSTER ANTIBODY, IGG: Varicella: IMMUNE

## 2015-06-19 LAB — OB RESULTS CONSOLE HEPATITIS B SURFACE ANTIGEN: Hepatitis B Surface Ag: NEGATIVE

## 2015-06-19 LAB — OB RESULTS CONSOLE HIV ANTIBODY (ROUTINE TESTING): HIV: NONREACTIVE

## 2015-06-19 LAB — OB RESULTS CONSOLE RUBELLA ANTIBODY, IGM: RUBELLA: IMMUNE

## 2015-06-20 LAB — URINE CULTURE: Special Requests: NORMAL

## 2015-10-21 ENCOUNTER — Observation Stay
Admission: EM | Admit: 2015-10-21 | Discharge: 2015-10-21 | Disposition: A | Discharge status: Home / Self Care | Payer: Medicaid Other | Attending: Obstetrics and Gynecology | Admitting: Obstetrics and Gynecology

## 2015-10-21 ENCOUNTER — Encounter: Payer: Self-pay | Admitting: *Deleted

## 2015-10-21 DIAGNOSIS — R102 Pelvic and perineal pain: Secondary | ICD-10-CM | POA: Diagnosis not present

## 2015-10-21 DIAGNOSIS — Z3A33 33 weeks gestation of pregnancy: Secondary | ICD-10-CM | POA: Diagnosis not present

## 2015-10-21 DIAGNOSIS — O26893 Other specified pregnancy related conditions, third trimester: Secondary | ICD-10-CM | POA: Diagnosis present

## 2015-10-21 DIAGNOSIS — N949 Unspecified condition associated with female genital organs and menstrual cycle: Secondary | ICD-10-CM | POA: Diagnosis present

## 2015-10-21 LAB — COMPREHENSIVE METABOLIC PANEL
ALBUMIN: 3.1 g/dL — AB (ref 3.5–5.0)
ALT: 22 U/L (ref 14–54)
ANION GAP: 10 (ref 5–15)
AST: 20 U/L (ref 15–41)
Alkaline Phosphatase: 78 U/L (ref 38–126)
BUN: 8 mg/dL (ref 6–20)
CHLORIDE: 105 mmol/L (ref 101–111)
CO2: 23 mmol/L (ref 22–32)
Calcium: 8.9 mg/dL (ref 8.9–10.3)
Creatinine, Ser: 0.71 mg/dL (ref 0.44–1.00)
GFR calc Af Amer: >60 mL/min (ref >60)
GFR calc non Af Amer: >60 mL/min (ref >60)
GLUCOSE: 89 mg/dL (ref 65–99)
POTASSIUM: 3.8 mmol/L (ref 3.5–5.1)
SODIUM: 138 mmol/L (ref 135–145)
Total Bilirubin: 0.4 mg/dL (ref 0.3–1.2)
Total Protein: 7.4 g/dL (ref 6.5–8.1)

## 2015-10-21 LAB — URINE DRUG SCREEN, QUALITATIVE (ARMC ONLY)
Amphetamines, Ur Screen: NOT DETECTED
BARBITURATES, UR SCREEN: NOT DETECTED
BENZODIAZEPINE, UR SCRN: NOT DETECTED
Cannabinoid 50 Ng, Ur ~~LOC~~: NOT DETECTED
Cocaine Metabolite,Ur ~~LOC~~: NOT DETECTED
MDMA (Ecstasy)Ur Screen: NOT DETECTED
METHADONE SCREEN, URINE: NOT DETECTED
OPIATE, UR SCREEN: NOT DETECTED
PHENCYCLIDINE (PCP) UR S: NOT DETECTED
Tricyclic, Ur Screen: NOT DETECTED

## 2015-10-21 LAB — URINALYSIS COMPLETE WITH MICROSCOPIC (ARMC ONLY)
BACTERIA UA: NONE SEEN
Bilirubin Urine: NEGATIVE
GLUCOSE, UA: NEGATIVE mg/dL
HGB URINE DIPSTICK: NEGATIVE
Ketones, ur: NEGATIVE mg/dL
NITRITE: NEGATIVE
Protein, ur: NEGATIVE mg/dL
Specific Gravity, Urine: 1.013 (ref 1.005–1.030)
pH: 6 (ref 5.0–8.0)

## 2015-10-21 LAB — CBC
HCT: 35.7 % (ref 35.0–47.0)
Hemoglobin: 11.9 g/dL — ABNORMAL LOW (ref 12.0–16.0)
MCH: 24.6 pg — ABNORMAL LOW (ref 26.0–34.0)
MCHC: 33.2 g/dL (ref 32.0–36.0)
MCV: 74.1 fL — ABNORMAL LOW (ref 80.0–100.0)
Platelets: 296 10*3/uL (ref 150–440)
RBC: 4.82 MIL/uL (ref 3.80–5.20)
RDW: 17.2 % — AB (ref 11.5–14.5)
WBC: 12.7 10*3/uL — AB (ref 3.6–11.0)

## 2015-10-21 LAB — PROTEIN / CREATININE RATIO, URINE
CREATININE, URINE: 134 mg/dL
PROTEIN CREATININE RATIO: 0.1 mg/mg{creat} (ref 0.00–0.15)
TOTAL PROTEIN, URINE: 14 mg/dL

## 2015-10-21 MED ORDER — METHYLDOPA 250 MG PO TABS: 250.0000 mg | ORAL_TABLET | Freq: Two times a day (BID) | ORAL | Status: DC

## 2015-10-21 NOTE — Final Progress Note (Signed)
Physician Final Progress Note  Patient ID: Yvette Wilson MRN: [id number] DOB/AGE: [birth year] 34 y.o.  Admit date: 10/21/2015 Admitting provider: Vena AustriaAndreas Courtney Bellizzi, MD Discharge date: 10/21/2015   Admission Diagnoses: Round ligament pain  Discharge Diagnoses:  Active Problems:   Round ligament pain   Consults: None  Results: Results for orders placed or performed during the hospital encounter of 10/21/15 (from the past 24 hour(s))  Urinalysis complete, with microscopic ([facility] only)     Status: Abnormal   Collection Time: 10/21/15  8:02 PM  Result Value Ref Range   Color, Urine YELLOW (A) YELLOW   APPearance HAZY (A) CLEAR   Glucose, UA NEGATIVE NEGATIVE mg/dL   Bilirubin Urine NEGATIVE NEGATIVE   Ketones, ur NEGATIVE NEGATIVE mg/dL   Specific Gravity, Urine 1.013 1.005 - 1.030   Hgb urine dipstick NEGATIVE NEGATIVE   pH 6.0 5.0 - 8.0   Protein, ur NEGATIVE NEGATIVE mg/dL   Nitrite NEGATIVE NEGATIVE   Leukocytes, UA 2+ (A) NEGATIVE   RBC / HPF 0-5 0 - 5 RBC/hpf   WBC, UA 0-5 0 - 5 WBC/hpf   Bacteria, UA NONE SEEN NONE SEEN   Squamous Epithelial / LPF 6-30 (A) NONE SEEN  Urine Drug Screen, Qualitative ([facility] only)     Status: None   Collection Time: 10/21/15  8:02 PM  Result Value Ref Range   Tricyclic, Ur Screen NONE DETECTED NONE DETECTED   Amphetamines, Ur Screen NONE DETECTED NONE DETECTED   MDMA (Ecstasy)Ur Screen NONE DETECTED NONE DETECTED   Cocaine Metabolite,Ur [place] NONE DETECTED NONE DETECTED   Opiate, Ur Screen NONE DETECTED NONE DETECTED   Phencyclidine (PCP) Ur S NONE DETECTED NONE DETECTED   Cannabinoid 50 Ng, Ur [place] NONE DETECTED NONE DETECTED   Barbiturates, Ur Screen NONE DETECTED NONE DETECTED   Benzodiazepine, Ur Scrn NONE DETECTED NONE DETECTED   Methadone Scn, Ur NONE DETECTED NONE DETECTED  CBC     Status: Abnormal   Collection Time: 10/21/15  8:09 PM  Result Value Ref Range   WBC 12.7 (H) 3.6 - 11.0 K/uL   RBC 4.82 3.80 - 5.20 MIL/uL   Hemoglobin  11.9 (L) 12.0 - 16.0 g/dL   HCT 40.935.7 81.135.0 - 91.447.0 %   MCV 74.1 (L) 80.0 - 100.0 fL   MCH 24.6 (L) 26.0 - 34.0 pg   MCHC 33.2 32.0 - 36.0 g/dL   RDW 78.217.2 (H) 95.611.5 - 21.314.5 %   Platelets 296 150 - 440 K/uL  Comprehensive metabolic panel     Status: Abnormal   Collection Time: 10/21/15  8:09 PM  Result Value Ref Range   Sodium 138 135 - 145 mmol/L   Potassium 3.8 3.5 - 5.1 mmol/L   Chloride 105 101 - 111 mmol/L   CO2 23 22 - 32 mmol/L   Glucose, Bld 89 65 - 99 mg/dL   BUN 8 6 - 20 mg/dL   Creatinine, Ser 0.860.71 0.44 - 1.00 mg/dL   Calcium 8.9 8.9 - 57.810.3 mg/dL   Total Protein 7.4 6.5 - 8.1 g/dL   Albumin 3.1 (L) 3.5 - 5.0 g/dL   AST 20 15 - 41 U/L   ALT 22 14 - 54 U/L   Alkaline Phosphatase 78 38 - 126 U/L   Total Bilirubin 0.4 0.3 - 1.2 mg/dL   GFR calc non Af Amer >60 >60 mL/min   GFR calc Af Amer >60 >60 mL/min   Anion gap 10 5 - 15     Procedures: NST 140,  moderate, +accels, no decels reactive/category I, no contractions  Discharge Condition: good  Disposition: 01-Home or Self Care  Diet: Regular diet  Discharge Activity: Activity as tolerated  Discharge Instructions    Discharge activity:  No Restrictions    Complete by:  As directed   Discharge diet:  No restrictions    Complete by:  As directed   No sexual activity restrictions    Complete by:  As directed   Notify physician for a general feeling that "something is not right"    Complete by:  As directed   Notify physician for increase or change in vaginal discharge    Complete by:  As directed   Notify physician for intestinal cramps, with or without diarrhea, sometimes described as "gas pain"    Complete by:  As directed   Notify physician for leaking of fluid    Complete by:  As directed   Notify physician for low, dull backache, unrelieved by heat or Tylenol    Complete by:  As directed   Notify physician for menstrual like cramps    Complete by:  As directed   Notify physician for pelvic pressure    Complete by:   As directed   Notify physician for uterine contractions.  These may be painless and feel like the uterus is tightening or the baby is  "balling up"    Complete by:  As directed   Notify physician for vaginal bleeding    Complete by:  As directed   PRETERM LABOR:  Includes any of the follwing symptoms that occur between 20 - [redacted] weeks gestation.  If these symptoms are not stopped, preterm labor can result in preterm delivery, placing your baby at risk    Complete by:  As directed       Medication List    STOP taking these medications   cephALEXin 500 MG capsule Commonly known as:  KEFLEX   clindamycin 150 MG capsule Commonly known as:  CLEOCIN   cyclobenzaprine 10 MG tablet Commonly known as:  FLEXERIL   hydrochlorothiazide 25 MG tablet Commonly known as:  HYDRODIURIL   ibuprofen 200 MG tablet Commonly known as:  ADVIL,MOTRIN   naproxen 250 MG tablet Commonly known as:  NAPROSYN   oxyCODONE-acetaminophen 5-325 MG tablet Commonly known as:  ROXICET     TAKE these medications   atenolol 25 MG tablet Commonly known as:  TENORMIN Take 25 mg by mouth daily.   ondansetron 4 MG disintegrating tablet Commonly known as:  ZOFRAN ODT Take 1 tablet (4 mg total) by mouth every 8 (eight) hours as needed for nausea or vomiting.   Prenatal Vitamins 0.8 MG tablet Take 1 tablet by mouth daily.        Total time spent taking care of this patient: 30 minutes  Signed: Tenelle Andreason M 10/21/2015, 9:00 PM

## 2015-10-21 NOTE — OB Triage Note (Signed)
Recvd pt from ED. C/o abdominal cramping, says that it is annoying and would not describe it as pain. States her lower back pain started today but says that she is on her feet all day at work. Says she has no vaginal bleeding or discharge and is staying well hydrated. No intercourse in the past 24 hours. Feeling baby move ok. Hooked up to EFM.

## 2015-10-28 ENCOUNTER — Encounter: Payer: Medicaid Other | Attending: Advanced Practice Midwife | Admitting: *Deleted

## 2015-10-28 ENCOUNTER — Encounter: Payer: Self-pay | Admitting: *Deleted

## 2015-10-28 VITALS — BP 160/90 | Ht 68.0 in | Wt 316.7 lb

## 2015-10-28 DIAGNOSIS — Z713 Dietary counseling and surveillance: Secondary | ICD-10-CM | POA: Diagnosis not present

## 2015-10-28 DIAGNOSIS — O24419 Gestational diabetes mellitus in pregnancy, unspecified control: Secondary | ICD-10-CM | POA: Diagnosis not present

## 2015-10-28 DIAGNOSIS — O24410 Gestational diabetes mellitus in pregnancy, diet controlled: Secondary | ICD-10-CM

## 2015-10-28 NOTE — Progress Notes (Signed)
Diabetes Self-Management Education  Visit Type: First/Initial  Appt. Start Time: 1350 Appt. End Time: 1525  10/28/2015  Ms. Yvette Wilson, identified by name and date of birth, is a 35 y.o. female with a diagnosis of Diabetes: Gestational Diabetes.   ASSESSMENT  Blood pressure (!) 160/90, height 5\' 8"  (1.727 m), weight (!) 316 lb 11.2 oz (143.7 kg), last menstrual period 03/17/2015. Body mass index is 48.15 kg/m.      Diabetes Self-Management Education - 10/28/15 1543      Visit Information   Visit Type First/Initial     Initial Visit   Diabetes Type Gestational Diabetes   Are you currently following a meal plan? Yes   What type of meal plan do you follow? "drinking more water and watching what I eat"   Are you taking your medications as prescribed? Yes   Date Diagnosed last week (10/2015)     Health Coping   How would you rate your overall health? Fair     Psychosocial Assessment   Patient Belief/Attitude about Diabetes Motivated to manage diabetes  "I am concerned about my health after birth and my baby's health"   Self-care barriers None   Self-management support Doctor's office;Family   Patient Concerns Nutrition/Meal planning;Glycemic Control;Weight Control;Healthy Lifestyle   Special Needs None   Preferred Learning Style Hands on   Learning Readiness Change in progress   How often do you need to have someone help you when you read instructions, pamphlets, or other written materials from your doctor or pharmacy? 1 - Never   What is the last grade level you completed in school? 2 years college     Pre-Education Assessment   Patient understands the diabetes disease and treatment process. Needs Instruction   Patient understands incorporating nutritional management into lifestyle. Needs Instruction   Patient undertands incorporating physical activity into lifestyle. Needs Instruction   Patient understands using medications safely. Needs Instruction   Patient  understands monitoring blood glucose, interpreting and using results Needs Instruction   Patient understands prevention, detection, and treatment of acute complications. Needs Instruction   Patient understands prevention, detection, and treatment of chronic complications. Needs Instruction   Patient understands how to develop strategies to address psychosocial issues. Needs Instruction   Patient understands how to develop strategies to promote health/change behavior. Needs Instruction     Complications   How often do you check your blood sugar? 0 times/day (not testing)  Provided Accu-Check Guide meter and instructed on use. BG upon return demonstration was 68 mg/dL at 4:09 pm - 6 hrs pp.    Have you had a dilated eye exam in the past 12 months? No   Have you had a dental exam in the past 12 months? No   Are you checking your feet? No     Dietary Intake   Breakfast egg and bagel; greek yogurt   Snack (morning) crackers or pretzels   Lunch grilled chicken salad   Snack (afternoon) fruit   Dinner chicken (grilled) and vegetables   Beverage(s) water, occasional apple juice unsweetened     Exercise   Exercise Type ADL's     Patient Education   Previous Diabetes Education No   Disease state  Definition of diabetes, type 1 and 2, and the diagnosis of diabetes   Nutrition management  Role of diet in the treatment of diabetes and the relationship between the three main macronutrients and blood glucose level   Physical activity and exercise  Role of exercise on diabetes  management, blood pressure control and cardiac health.   Monitoring Taught/evaluated SMBG meter.;Purpose and frequency of SMBG.;Ketone testing, when, how.;Taught/discussed recording of test results and interpretation of SMBG.   Chronic complications Relationship between chronic complications and blood glucose control   Psychosocial adjustment Identified and addressed patients feelings and concerns about diabetes   Preconception  care Pregnancy and GDM  Role of pre-pregnancy blood glucose control on the development of the fetus;Reviewed with patient blood glucose goals with pregnancy;Role of family planning for patients with diabetes     Individualized Goals (developed by patient)   Reducing Risk Improve blood sugars Lose weight Lead a healthier lifestyle Become more fit     Outcomes   Expected Outcomes Demonstrated interest in learning. Expect positive outcomes      Individualized Plan for Diabetes Self-Management Training:   Learning Objective:  Patient will have a greater understanding of diabetes self-management. Patient education plan is to attend individual and/or group sessions per assessed needs and concerns.   Plan:   Patient Instructions  Read booklet on Gestational Diabetes Follow Gestational Meal Planning Guidelines Complete a 3 Day Food Record and bring to next appointment Limit fruit juices and avoid sugar sweetened drinks Check blood sugars 4 x day - before breakfast and 2 hrs after every meal and record  Bring blood sugar log to all appointments Call MD for prescription for meter strips and lancets Strips Accu-Chek Guide  Lancets   Accu-Chek FastClix Purchase urine ketone strips if blood sugars not controlled and check urine ketones every am:  If + increase bedtime snack to 1 protein and 2 carbohydrate servings Walk 20-30 minutes at least 5 x week if permitted by MD   Expected Outcomes:  Demonstrated interest in learning. Expect positive outcomes  Education material provided:  Gestational Booklet Gestational Meal Planning Guidelines Viewed Gestational Diabetes Video Meter  Accu-Chek Guide 3 Day Food Record Goals for a Healthy Pregnancy  If problems or questions, patient to contact team via:  Sharion SettlerSheila Dreya Buhrman, RN, CCM, CDE [phone]  Future DSME appointment:  Thursday Sept 21, 2017 with Pam (dietitian)

## 2015-10-28 NOTE — Patient Instructions (Signed)
Read booklet on Gestational Diabetes Follow Gestational Meal Planning Guidelines Complete a 3 Day Food Record and bring to next appointment Limit fruit juices and avoid sugar sweetened drinks Check blood sugars 4 x day - before breakfast and 2 hrs after every meal and record  Bring blood sugar log to all appointments Call MD for prescription for meter strips and lancets Strips Accu-Chek Guide  Lancets   Accu-Chek FastClix Purchase urine ketone strips if blood sugars not controlled and check urine ketones every am:  If + increase bedtime snack to 1 protein and 2 carbohydrate servings Walk 20-30 minutes at least 5 x week if permitted by MD

## 2015-11-05 ENCOUNTER — Ambulatory Visit: Payer: Medicaid Other | Admitting: Dietician

## 2015-11-09 ENCOUNTER — Encounter: Payer: Self-pay | Admitting: *Deleted

## 2015-11-09 ENCOUNTER — Observation Stay
Admission: EM | Admit: 2015-11-09 | Discharge: 2015-11-09 | Disposition: A | Discharge status: Home / Self Care | Payer: Medicaid Other | Attending: Obstetrics & Gynecology | Admitting: Obstetrics & Gynecology

## 2015-11-09 DIAGNOSIS — O24410 Gestational diabetes mellitus in pregnancy, diet controlled: Secondary | ICD-10-CM | POA: Diagnosis not present

## 2015-11-09 DIAGNOSIS — O219 Vomiting of pregnancy, unspecified: Secondary | ICD-10-CM | POA: Diagnosis present

## 2015-11-09 DIAGNOSIS — R1010 Upper abdominal pain, unspecified: Secondary | ICD-10-CM | POA: Insufficient documentation

## 2015-11-09 DIAGNOSIS — Z3A33 33 weeks gestation of pregnancy: Secondary | ICD-10-CM | POA: Insufficient documentation

## 2015-11-09 DIAGNOSIS — R197 Diarrhea, unspecified: Secondary | ICD-10-CM | POA: Insufficient documentation

## 2015-11-09 DIAGNOSIS — O139 Gestational [pregnancy-induced] hypertension without significant proteinuria, unspecified trimester: Secondary | ICD-10-CM | POA: Diagnosis present

## 2015-11-09 DIAGNOSIS — O9989 Other specified diseases and conditions complicating pregnancy, childbirth and the puerperium: Principal | ICD-10-CM | POA: Insufficient documentation

## 2015-11-09 DIAGNOSIS — O163 Unspecified maternal hypertension, third trimester: Secondary | ICD-10-CM | POA: Insufficient documentation

## 2015-11-09 DIAGNOSIS — R112 Nausea with vomiting, unspecified: Secondary | ICD-10-CM | POA: Diagnosis not present

## 2015-11-09 DIAGNOSIS — O10913 Unspecified pre-existing hypertension complicating pregnancy, third trimester: Secondary | ICD-10-CM | POA: Insufficient documentation

## 2015-11-09 DIAGNOSIS — Z87891 Personal history of nicotine dependence: Secondary | ICD-10-CM | POA: Diagnosis not present

## 2015-11-09 DIAGNOSIS — Z880 Allergy status to penicillin: Secondary | ICD-10-CM | POA: Insufficient documentation

## 2015-11-09 HISTORY — DX: Gestational (pregnancy-induced) hypertension without significant proteinuria, unspecified trimester: O13.9

## 2015-11-09 MED ORDER — ONDANSETRON 4 MG PO TBDP: 4.0000 mg | ORAL_TABLET | Freq: Three times a day (TID) | ORAL | 0 refills | Status: DC | PRN

## 2015-11-09 NOTE — Discharge Summary (Signed)
Physician Final Progress Note  Patient ID: Yvette Wilson MRN: [id number] DOB/AGE: 1980/05/04 35 y.o.  Admit date: 11/09/2015 Admitting provider: Tresea MallJane Ormond Lazo, CNM Discharge date: 11/09/2015   Admission Diagnoses: [id number] at 7252w6d with complaints of intermittent upper abdominal pain since 2 am, along with nausea, vomiting and diarrhea. Pt has a hx of chronic hypertension and takes atenolol daily. She has gestational diabetes which is diet controlled. She admits adequate hydration, and positive fetal movement. She denies LOF/VB  Discharge Diagnoses:  Active Problems:   Nausea and vomiting of pregnancy, antepartum   Gestational hypertension, Gestational Diabetes diet controlled  IUP with reactive NST not in labor  Hospital Course: Pt was admitted and placed on monitors, cervical exam  Past Medical History:  Diagnosis Date  . Gestational diabetes    Gestational, diet treated  . Hypertension    with all pregnancies, and then continued after last pregnancy    Past Surgical History:  Procedure Laterality Date  . APPENDECTOMY     Dec 2007  . CESAREAN SECTION     May 2008, breech    No current facility-administered medications on file prior to encounter.    Current Outpatient Prescriptions on File Prior to Encounter  Medication Sig Dispense Refill  . atenolol (TENORMIN) 25 MG tablet Take 25 mg by mouth daily.    . Prenatal Multivit-Min-Fe-FA (PRENATAL VITAMINS) 0.8 MG tablet Take 1 tablet by mouth daily. 90 tablet 3    Allergies  Allergen Reactions  . Penicillins Rash    Has patient had a PCN reaction causing immediate rash, facial/tongue/throat swelling, SOB or lightheadedness with hypotension: YES Has patient had a PCN reaction causing severe rash involving mucus membranes or skin necrosis: NO Has patient had a PCN reaction that required hospitalization  NO Has patient had a PCN reaction occurring within the last 10 years:  NO     Social History   Social History   . Marital status: Single    Spouse name: N/A  . Number of children: N/A  . Years of education: N/A   Occupational History  . Not on file.   Social History Main Topics  . Smoking status: Former Smoker    Packs/day: 0.50    Years: 3.00    Types: Cigarettes    Quit date: 02/26/2005  . Smokeless tobacco: Never Used  . Alcohol use No  . Drug use: No  . Sexual activity: Yes    Birth control/ protection: None   Other Topics Concern  . Not on file   Social History Narrative  . No narrative on file    Physical Exam: BP (!) 149/90   Pulse 82   Temp 98.4 F (36.9 C) (Oral)   Resp 18   Ht 5\' 8"  (1.727 m)   Wt (!) 314 lb (142.4 kg)   LMP 03/17/2015   BMI 47.74 kg/m   Gen: NAD CV: RRR Pulm: CTAB Pelvic: closed/thick/-3 Toco: q 6-12 Fetal Well Being: 145 bpm, moderate variability, + accelerations, - decelerations Ext: no evidence of DVT on exam  Consults: None  Significant Findings/ Diagnostic Studies: none  Procedures: NST  Discharge Condition: good  Disposition: 01-Home or Self Care  Diet: Diabetic diet  Discharge Activity: Activity as tolerated  Discharge Instructions    Discharge activity:  No Restrictions    Complete by:  As directed    Discharge diet:  No restrictions    Complete by:  As directed    Fetal Kick Count:  Lie on our left  side for one hour after a meal, and count the number of times your baby kicks.  If it is less than 5 times, get up, move around and drink some juice.  Repeat the test 30 minutes later.  If it is still less than 5 kicks in an hour, notify your doctor.    Complete by:  As directed    No sexual activity restrictions    Complete by:  As directed    Notify physician for a general feeling that "something is not right"    Complete by:  As directed    Notify physician for increase or change in vaginal discharge    Complete by:  As directed    Notify physician for intestinal cramps, with or without diarrhea, sometimes described as  "gas pain"    Complete by:  As directed    Notify physician for leaking of fluid    Complete by:  As directed    Notify physician for low, dull backache, unrelieved by heat or Tylenol    Complete by:  As directed    Notify physician for menstrual like cramps    Complete by:  As directed    Notify physician for pelvic pressure    Complete by:  As directed    Notify physician for uterine contractions.  These may be painless and feel like the uterus is tightening or the baby is  "balling up"    Complete by:  As directed    Notify physician for vaginal bleeding    Complete by:  As directed    PRETERM LABOR:  Includes any of the follwing symptoms that occur between 20 - [redacted] weeks gestation.  If these symptoms are not stopped, preterm labor can result in preterm delivery, placing your baby at risk    Complete by:  As directed        Medication List    TAKE these medications   atenolol 25 MG tablet Commonly known as:  TENORMIN Take 25 mg by mouth daily.   ondansetron 4 MG disintegrating tablet Commonly known as:  ZOFRAN ODT Take 1 tablet (4 mg total) by mouth every 8 (eight) hours as needed for nausea or vomiting.   Prenatal Vitamins 0.8 MG tablet Take 1 tablet by mouth daily.      Follow-up Information    [facility], [place] .   Why:  go to regular scheduled prenatal appointment Contact information: [address] [place] [address] [phone]           Total time spent taking care of this patient: 25 minutes  Signed: Tresea Mall, CNM  11/09/2015, 6:18 AM

## 2015-11-09 NOTE — OB Triage Note (Signed)
-  awake at 0200 to have BM-  Large green liquid BM  production,  -states she has had approx 6 more similar stools since then -states she has vomited x2 - brown liquid - reports stomach- upper to mid abd pain started with urge to have BM at 0200. At first occurring every 10 min, now to  [employer]

## 2015-11-09 NOTE — Progress Notes (Signed)
Pt reports that she has followed every week recently to observe her BP. States she had hypertension with each previous pregnancy, and that it continued post delivery- see med list. Presently, R-2+, 0 clonus, Pt reports no visual disturbances, h/a or R upper epigastric pain. No edema noted pedally, 1-2+ with hands. Awaiting assessment by CNM

## 2015-11-10 ENCOUNTER — Encounter: Payer: Medicaid Other | Admitting: Dietician

## 2015-11-10 ENCOUNTER — Encounter: Payer: Self-pay | Admitting: Dietician

## 2015-11-10 VITALS — BP 110/80 | Ht 68.0 in | Wt 309.6 lb

## 2015-11-10 DIAGNOSIS — O24410 Gestational diabetes mellitus in pregnancy, diet controlled: Secondary | ICD-10-CM

## 2015-11-10 DIAGNOSIS — Z713 Dietary counseling and surveillance: Secondary | ICD-10-CM | POA: Diagnosis not present

## 2015-11-10 NOTE — Progress Notes (Signed)
   Patient's BG record indicates Fasting BGs are within goal range, although many recorded results are exactly 90. Post-meal BGs are often elevated, ranging 92-199.   She reports 2 episodes of low BG symptoms in the past 2 weeks; instruction on appropriate treatment for low BGs.  Patient's food diary indicates generally balanced meals, with some possibly high in carb intake. Discussed food portions using food models.    Provided 1900kcal meal plan, and wrote individualized menus based on patient's food preferences.  Instructed patient on food safety, including avoidance of Listeriosis, and limiting mercury from fish.  Discussed importance of maintaining healthy lifestyle habits to reduce risk of Type 2 DM as well as Gestational DM with any future pregnancies.  Advised patient to use any remaining testing supplies to test some BGs after delivery, and to have BG tested ideally annually, as well as prior to attempting future pregnancies.

## 2015-11-10 NOTE — Patient Instructions (Signed)
   Control carb intake to 3 servings with each meal.   Include a protein source with your snacks.

## 2015-12-02 LAB — OB RESULTS CONSOLE GC/CHLAMYDIA
CHLAMYDIA, DNA PROBE: NEGATIVE
GC PROBE AMP, GENITAL: NEGATIVE

## 2015-12-02 LAB — OB RESULTS CONSOLE GBS: GBS: NEGATIVE

## 2015-12-16 ENCOUNTER — Encounter
Admission: RE | Admit: 2015-12-16 | Discharge: 2015-12-16 | Disposition: A | Discharge status: Home / Self Care | Payer: Medicaid Other | Source: Ambulatory Visit | Attending: Obstetrics and Gynecology | Admitting: Obstetrics and Gynecology

## 2015-12-16 HISTORY — DX: Personal history of other complications of pregnancy, childbirth and the puerperium: Z87.59

## 2015-12-16 HISTORY — DX: Sickle-cell trait: D57.3

## 2015-12-16 LAB — DIFFERENTIAL
Basophils Absolute: 0.1 10*3/uL (ref 0–0.1)
Basophils Relative: 1 %
Eosinophils Absolute: 0.2 10*3/uL (ref 0–0.7)
Eosinophils Relative: 2 %
Lymphocytes Relative: 21 %
Lymphs Abs: 2.1 10*3/uL (ref 1.0–3.6)
Monocytes Absolute: 0.7 10*3/uL (ref 0.2–0.9)
Monocytes Relative: 8 %
Neutro Abs: 6.7 10*3/uL — ABNORMAL HIGH (ref 1.4–6.5)
Neutrophils Relative %: 68 %

## 2015-12-16 LAB — TYPE AND SCREEN
ABO/RH(D): A POS
Antibody Screen: NEGATIVE
Extend sample reason: UNDETERMINED

## 2015-12-16 LAB — CBC
HCT: 37.1 % (ref 35.0–47.0)
Hemoglobin: 12.3 g/dL (ref 12.0–16.0)
MCH: 24.2 pg — ABNORMAL LOW (ref 26.0–34.0)
MCHC: 33.1 g/dL (ref 32.0–36.0)
MCV: 73.1 fL — ABNORMAL LOW (ref 80.0–100.0)
Platelets: 305 10*3/uL (ref 150–440)
RBC: 5.07 MIL/uL (ref 3.80–5.20)
RDW: 17.6 % — ABNORMAL HIGH (ref 11.5–14.5)
WBC: 9.7 10*3/uL (ref 3.6–11.0)

## 2015-12-16 NOTE — Patient Instructions (Signed)
  Your procedure is scheduled on: December 17, 2015 Report to  EMERGENCY DEPARTMENT ARRIVAL TIME 7:30 AM   Remember: Instructions that are not followed completely may result in serious medical risk, up to and including death, or upon the discretion of your surgeon and anesthesiologist your surgery may need to be rescheduled.    _x___ 1. Do not eat food or drink liquids after midnight. No gum chewing or hard candies.     __x__ 2. No Alcohol for 24 hours before or after surgery.   __x__3. No Smoking for 24 prior to surgery.   ____  4. Bring all medications with you on the day of surgery if instructed.    __x__ 5. Notify your doctor if there is any change in your medical condition     (cold, fever, infections).     Do not wear jewelry, make-up, hairpins, clips or nail polish.  Do not wear lotions, powders, or perfumes. You may wear deodorant.  Do not shave 48 hours prior to surgery. Men may shave face and neck.  Do not bring valuables to the hospital.    [facility] not [facility] for any belongings or valuables.               Contacts, dentures or bridgework may not be worn into surgery.  Leave your suitcase in the car. After surgery it may be brought to your room.  For patients admitted to the hospital, discharge time is determined by your treatment team.   Patients discharged the day of surgery will not be allowed to drive home.    Please read over the following fact sheets that you were given:   [facility] Preparing for Surgery and or MRSA Information   _x___ Take these medicines the morning of surgery with A SIP OF WATER:    1. Atenolol  2.  3.  4.  5.  6.  ____Fleets enema or Magnesium Citrate as directed.   _x___ Use CHG Soap or sage wipes as directed on instruction sheet   ____ Use inhalers on the day of surgery and bring to hospital day of surgery  ____ Stop metformin 2 days prior to surgery    ____ Take 1/2 of usual insulin dose the night before surgery  and none on the morning of           surgery.   ____ Stop aspirin or coumadin, or plavix  x__ Stop Anti-inflammatories such as Advil, Aleve, Ibuprofen, Motrin, Naproxen,          Naprosyn, Goodies powders or aspirin products. Ok to take Tylenol.   ____ Stop supplements until after surgery.    ____ Bring C-Pap to the hospital.

## 2015-12-17 ENCOUNTER — Inpatient Hospital Stay: Payer: Medicaid Other | Admitting: Registered Nurse

## 2015-12-17 ENCOUNTER — Encounter: Payer: Self-pay | Admitting: *Deleted

## 2015-12-17 ENCOUNTER — Encounter
Admission: RE | Disposition: A | Discharge status: Home / Self Care | Payer: Self-pay | Source: Ambulatory Visit | Attending: Obstetrics and Gynecology

## 2015-12-17 ENCOUNTER — Inpatient Hospital Stay
Admission: RE | Admit: 2015-12-17 | Discharge: 2015-12-20 | DRG: 765 | Disposition: A | Discharge status: Home / Self Care | Payer: Medicaid Other | Source: Ambulatory Visit | Attending: Obstetrics and Gynecology | Admitting: Obstetrics and Gynecology

## 2015-12-17 DIAGNOSIS — O1002 Pre-existing essential hypertension complicating childbirth: Secondary | ICD-10-CM | POA: Diagnosis present

## 2015-12-17 DIAGNOSIS — O34211 Maternal care for low transverse scar from previous cesarean delivery: Secondary | ICD-10-CM | POA: Diagnosis present

## 2015-12-17 DIAGNOSIS — Z302 Encounter for sterilization: Secondary | ICD-10-CM | POA: Diagnosis not present

## 2015-12-17 DIAGNOSIS — O24410 Gestational diabetes mellitus in pregnancy, diet controlled: Secondary | ICD-10-CM | POA: Diagnosis present

## 2015-12-17 DIAGNOSIS — O9081 Anemia of the puerperium: Secondary | ICD-10-CM | POA: Diagnosis not present

## 2015-12-17 DIAGNOSIS — Z87891 Personal history of nicotine dependence: Secondary | ICD-10-CM

## 2015-12-17 DIAGNOSIS — Z98891 History of uterine scar from previous surgery: Secondary | ICD-10-CM

## 2015-12-17 DIAGNOSIS — Z3A38 38 weeks gestation of pregnancy: Secondary | ICD-10-CM

## 2015-12-17 DIAGNOSIS — D62 Acute posthemorrhagic anemia: Secondary | ICD-10-CM | POA: Diagnosis not present

## 2015-12-17 LAB — COMPREHENSIVE METABOLIC PANEL
ALBUMIN: 2.6 g/dL — AB (ref 3.5–5.0)
ALT: 15 U/L (ref 14–54)
ANION GAP: 10 (ref 5–15)
AST: 28 U/L (ref 15–41)
Alkaline Phosphatase: 107 U/L (ref 38–126)
BILIRUBIN TOTAL: 0.8 mg/dL (ref 0.3–1.2)
BUN: <5 mg/dL — ABNORMAL LOW (ref 6–20)
CHLORIDE: 108 mmol/L (ref 101–111)
CO2: 22 mmol/L (ref 22–32)
Calcium: 8.5 mg/dL — ABNORMAL LOW (ref 8.9–10.3)
Creatinine, Ser: 0.76 mg/dL (ref 0.44–1.00)
GFR calc Af Amer: >60 mL/min (ref >60)
GLUCOSE: 76 mg/dL (ref 65–99)
POTASSIUM: 4.8 mmol/L (ref 3.5–5.1)
Sodium: 140 mmol/L (ref 135–145)
TOTAL PROTEIN: 6.6 g/dL (ref 6.5–8.1)

## 2015-12-17 LAB — GLUCOSE, CAPILLARY
GLUCOSE-CAPILLARY: 83 mg/dL (ref 65–99)
Glucose-Capillary: 78 mg/dL (ref 65–99)

## 2015-12-17 LAB — URINE DRUG SCREEN, QUALITATIVE (ARMC ONLY)
AMPHETAMINES, UR SCREEN: NOT DETECTED
Barbiturates, Ur Screen: NOT DETECTED
Benzodiazepine, Ur Scrn: NOT DETECTED
CANNABINOID 50 NG, UR ~~LOC~~: NOT DETECTED
COCAINE METABOLITE, UR ~~LOC~~: NOT DETECTED
MDMA (ECSTASY) UR SCREEN: NOT DETECTED
Methadone Scn, Ur: NOT DETECTED
OPIATE, UR SCREEN: NOT DETECTED
PHENCYCLIDINE (PCP) UR S: NOT DETECTED
Tricyclic, Ur Screen: NOT DETECTED

## 2015-12-17 LAB — CHLAMYDIA/NGC RT PCR (ARMC ONLY)
Chlamydia Tr: NOT DETECTED
N gonorrhoeae: NOT DETECTED

## 2015-12-17 LAB — PROTEIN / CREATININE RATIO, URINE
CREATININE, URINE: 257 mg/dL
PROTEIN CREATININE RATIO: 0.08 mg/mg{creat} (ref 0.00–0.15)
TOTAL PROTEIN, URINE: 21 mg/dL

## 2015-12-17 LAB — RPR: RPR: NONREACTIVE

## 2015-12-17 SURGERY — Surgical Case
Anesthesia: Spinal

## 2015-12-17 MED ORDER — ONDANSETRON HCL 4 MG/2ML IJ SOLN
INTRAMUSCULAR | Status: DC | PRN
  Administered 2015-12-17: 4 mg via INTRAVENOUS

## 2015-12-17 MED ORDER — NALBUPHINE HCL 10 MG/ML IJ SOLN: 5.0000 mg | Freq: Once | INTRAMUSCULAR | Status: DC | PRN

## 2015-12-17 MED ORDER — IBUPROFEN 600 MG PO TABS
600.0000 mg | ORAL_TABLET | Freq: Four times a day (QID) | ORAL | Status: DC
  Administered 2015-12-18 – 2015-12-20 (×8): 600 mg via ORAL
  Filled 2015-12-17 (×8): qty 1

## 2015-12-17 MED ORDER — OXYCODONE-ACETAMINOPHEN 5-325 MG PO TABS: 1.0000 | ORAL_TABLET | ORAL | Status: DC | PRN

## 2015-12-17 MED ORDER — NALOXONE HCL 2 MG/2ML IJ SOSY
1.0000 ug/kg/h | PREFILLED_SYRINGE | INTRAVENOUS | Status: DC | PRN
  Filled 2015-12-17: qty 2

## 2015-12-17 MED ORDER — BUPIVACAINE 0.25 % ON-Q PUMP DUAL CATH 400 ML
400.0000 mL | INJECTION | Status: DC
  Filled 2015-12-17: qty 400

## 2015-12-17 MED ORDER — OXYCODONE-ACETAMINOPHEN 5-325 MG PO TABS
2.0000 | ORAL_TABLET | ORAL | Status: DC | PRN
  Administered 2015-12-18 – 2015-12-19 (×6): 2 via ORAL
  Filled 2015-12-17 (×6): qty 2

## 2015-12-17 MED ORDER — DIPHENHYDRAMINE HCL 25 MG PO CAPS: 25.0000 mg | ORAL_CAPSULE | Freq: Four times a day (QID) | ORAL | Status: DC | PRN

## 2015-12-17 MED ORDER — KETOROLAC TROMETHAMINE 30 MG/ML IJ SOLN
INTRAMUSCULAR | Status: DC | PRN
  Administered 2015-12-17: 30 mg via INTRAVENOUS

## 2015-12-17 MED ORDER — BUPIVACAINE 0.25 % ON-Q PUMP SINGLE CATH 400 ML: 400.0000 mL | INJECTION | Status: DC

## 2015-12-17 MED ORDER — PHENYLEPHRINE HCL 10 MG/ML IJ SOLN
INTRAMUSCULAR | Status: DC | PRN
  Administered 2015-12-17 (×2): 100 ug via INTRAVENOUS

## 2015-12-17 MED ORDER — CEFAZOLIN SODIUM-DEXTROSE 2-4 GM/100ML-% IV SOLN
2.0000 g | INTRAVENOUS | Status: AC
  Administered 2015-12-17: 2 g via INTRAVENOUS
  Filled 2015-12-17: qty 100

## 2015-12-17 MED ORDER — NALBUPHINE HCL 10 MG/ML IJ SOLN: 5.0000 mg | INTRAMUSCULAR | Status: DC | PRN

## 2015-12-17 MED ORDER — KETOROLAC TROMETHAMINE 30 MG/ML IJ SOLN
30.0000 mg | Freq: Four times a day (QID) | INTRAMUSCULAR | Status: AC | PRN
Start: 2015-12-17 — End: 2015-12-18
  Administered 2015-12-17 – 2015-12-18 (×3): 30 mg via INTRAVENOUS
  Filled 2015-12-17 (×3): qty 1

## 2015-12-17 MED ORDER — COCONUT OIL OIL: 1.0000 "application " | TOPICAL_OIL | Status: DC | PRN

## 2015-12-17 MED ORDER — SODIUM CHLORIDE 0.9% FLUSH: 3.0000 mL | INTRAVENOUS | Status: DC | PRN

## 2015-12-17 MED ORDER — FENTANYL CITRATE (PF) 100 MCG/2ML IJ SOLN: 25.0000 ug | INTRAMUSCULAR | Status: DC | PRN

## 2015-12-17 MED ORDER — ONDANSETRON HCL 4 MG/2ML IJ SOLN: 4.0000 mg | Freq: Once | INTRAMUSCULAR | Status: DC | PRN

## 2015-12-17 MED ORDER — NALOXONE HCL 0.4 MG/ML IJ SOLN: 0.4000 mg | INTRAMUSCULAR | Status: DC | PRN

## 2015-12-17 MED ORDER — SIMETHICONE 80 MG PO CHEW
80.0000 mg | CHEWABLE_TABLET | Freq: Three times a day (TID) | ORAL | Status: DC
  Administered 2015-12-18 – 2015-12-20 (×6): 80 mg via ORAL
  Filled 2015-12-17 (×6): qty 1

## 2015-12-17 MED ORDER — PRENATAL MULTIVITAMIN CH
1.0000 | ORAL_TABLET | Freq: Every day | ORAL | Status: DC
  Administered 2015-12-18 – 2015-12-19 (×2): 1 via ORAL
  Filled 2015-12-17 (×2): qty 1

## 2015-12-17 MED ORDER — DIPHENHYDRAMINE HCL 25 MG PO CAPS: 25.0000 mg | ORAL_CAPSULE | ORAL | Status: DC | PRN

## 2015-12-17 MED ORDER — MEPERIDINE HCL 25 MG/ML IJ SOLN: 6.2500 mg | INTRAMUSCULAR | Status: DC | PRN

## 2015-12-17 MED ORDER — LACTATED RINGERS IV SOLN
INTRAVENOUS | Status: DC
  Administered 2015-12-17 – 2015-12-18 (×2): via INTRAVENOUS

## 2015-12-17 MED ORDER — SIMETHICONE 80 MG PO CHEW: 80.0000 mg | CHEWABLE_TABLET | ORAL | Status: DC | PRN

## 2015-12-17 MED ORDER — BUPIVACAINE IN DEXTROSE 0.75-8.25 % IT SOLN
INTRATHECAL | Status: DC | PRN
  Administered 2015-12-17: 1.6 mL via INTRATHECAL

## 2015-12-17 MED ORDER — DIPHENHYDRAMINE HCL 50 MG/ML IJ SOLN: 12.5000 mg | INTRAMUSCULAR | Status: DC | PRN

## 2015-12-17 MED ORDER — IBUPROFEN 600 MG PO TABS: 600.0000 mg | ORAL_TABLET | Freq: Four times a day (QID) | ORAL | Status: DC | PRN

## 2015-12-17 MED ORDER — OXYTOCIN 40 UNITS IN LACTATED RINGERS INFUSION - SIMPLE MED
2.5000 [IU]/h | INTRAVENOUS | Status: AC
  Filled 2015-12-17: qty 1000

## 2015-12-17 MED ORDER — ONDANSETRON HCL 4 MG/2ML IJ SOLN: 4.0000 mg | Freq: Three times a day (TID) | INTRAMUSCULAR | Status: DC | PRN

## 2015-12-17 MED ORDER — ACETAMINOPHEN 325 MG PO TABS: 650.0000 mg | ORAL_TABLET | ORAL | Status: DC | PRN

## 2015-12-17 MED ORDER — SOD CITRATE-CITRIC ACID 500-334 MG/5ML PO SOLN
30.0000 mL | ORAL | Status: AC
  Administered 2015-12-17: 30 mL via ORAL
  Filled 2015-12-17: qty 30

## 2015-12-17 MED ORDER — DIBUCAINE 1 % RE OINT: 1.0000 "application " | TOPICAL_OINTMENT | RECTAL | Status: DC | PRN

## 2015-12-17 MED ORDER — MENTHOL 3 MG MT LOZG
1.0000 | LOZENGE | OROMUCOSAL | Status: DC | PRN
  Filled 2015-12-17: qty 9

## 2015-12-17 MED ORDER — BUPIVACAINE HCL (PF) 0.5 % IJ SOLN
INTRAMUSCULAR | Status: AC
  Filled 2015-12-17: qty 30

## 2015-12-17 MED ORDER — SIMETHICONE 80 MG PO CHEW
80.0000 mg | CHEWABLE_TABLET | ORAL | Status: DC
  Administered 2015-12-19 (×2): 80 mg via ORAL
  Filled 2015-12-17 (×2): qty 1

## 2015-12-17 MED ORDER — OXYTOCIN 40 UNITS IN LACTATED RINGERS INFUSION - SIMPLE MED
INTRAVENOUS | Status: DC | PRN
  Administered 2015-12-17: 1000 mL via INTRAVENOUS

## 2015-12-17 MED ORDER — SENNOSIDES-DOCUSATE SODIUM 8.6-50 MG PO TABS
2.0000 | ORAL_TABLET | ORAL | Status: DC
  Administered 2015-12-18 – 2015-12-19 (×3): 2 via ORAL
  Filled 2015-12-17 (×3): qty 2

## 2015-12-17 MED ORDER — ATENOLOL 50 MG PO TABS
25.0000 mg | ORAL_TABLET | Freq: Two times a day (BID) | ORAL | Status: DC
  Administered 2015-12-17 – 2015-12-19 (×4): 25 mg via ORAL
  Administered 2015-12-19: 50 mg via ORAL
  Administered 2015-12-20: 25 mg via ORAL
  Filled 2015-12-17: qty 0.5
  Filled 2015-12-17 (×3): qty 1
  Filled 2015-12-17: qty 0.5
  Filled 2015-12-17 (×3): qty 1

## 2015-12-17 MED ORDER — WITCH HAZEL-GLYCERIN EX PADS: 1.0000 "application " | MEDICATED_PAD | CUTANEOUS | Status: DC | PRN

## 2015-12-17 MED ORDER — MORPHINE SULFATE (PF) 0.5 MG/ML IJ SOLN
INTRAMUSCULAR | Status: DC | PRN
  Administered 2015-12-17: .1 mg via INTRATHECAL

## 2015-12-17 MED ORDER — LACTATED RINGERS IV SOLN
INTRAVENOUS | Status: DC
  Administered 2015-12-17 (×2): via INTRAVENOUS

## 2015-12-17 MED ORDER — LACTATED RINGERS IV SOLN
Freq: Once | INTRAVENOUS | Status: AC
Start: 2015-12-17 — End: 2015-12-17
  Administered 2015-12-17: 08:00:00 via INTRAVENOUS

## 2015-12-17 MED ORDER — SCOPOLAMINE 1 MG/3DAYS TD PT72
1.0000 | MEDICATED_PATCH | Freq: Once | TRANSDERMAL | Status: DC
  Administered 2015-12-17: 1.5 mg via TRANSDERMAL
  Filled 2015-12-17: qty 1

## 2015-12-17 SURGICAL SUPPLY — 27 items
BAG COUNTER SPONGE EZ (MISCELLANEOUS) ×2 IMPLANT
CANISTER SUCT 3000ML (MISCELLANEOUS) ×2 IMPLANT
CATH KIT ON-Q SILVERSOAK 5IN (CATHETERS) ×4 IMPLANT
CHLORAPREP W/TINT 26ML (MISCELLANEOUS) ×4 IMPLANT
DRSG OPSITE POSTOP 4X10 (GAUZE/BANDAGES/DRESSINGS) ×2 IMPLANT
DRSG TELFA 3X8 NADH (GAUZE/BANDAGES/DRESSINGS) ×2 IMPLANT
ELECT CAUTERY BLADE 6.4 (BLADE) ×2 IMPLANT
ELECT REM PT RETURN 9FT ADLT (ELECTROSURGICAL) ×2
ELECTRODE REM PT RTRN 9FT ADLT (ELECTROSURGICAL) ×1 IMPLANT
GAUZE SPONGE 4X4 12PLY STRL (GAUZE/BANDAGES/DRESSINGS) ×2 IMPLANT
GLOVE BIO SURGEON STRL SZ7 (GLOVE) ×8 IMPLANT
GLOVE INDICATOR 7.5 STRL GRN (GLOVE) ×8 IMPLANT
GOWN STRL REUS W/ TWL LRG LVL3 (GOWN DISPOSABLE) ×3 IMPLANT
GOWN STRL REUS W/TWL LRG LVL3 (GOWN DISPOSABLE) ×3
LIQUID BAND (GAUZE/BANDAGES/DRESSINGS) ×2 IMPLANT
NS IRRIG 1000ML POUR BTL (IV SOLUTION) ×2 IMPLANT
PACK C SECTION AR (MISCELLANEOUS) ×2 IMPLANT
PAD OB MATERNITY 4.3X12.25 (PERSONAL CARE ITEMS) ×2 IMPLANT
PAD PREP 24X41 OB/GYN DISP (PERSONAL CARE ITEMS) ×2 IMPLANT
STAPLER INSORB 30 2030 C-SECTI (MISCELLANEOUS) ×2 IMPLANT
STRIP CLOSURE SKIN 1/2X4 (GAUZE/BANDAGES/DRESSINGS) ×2 IMPLANT
SUT MNCRL AB 4-0 PS2 18 (SUTURE) ×2 IMPLANT
SUT PDS AB 1 TP1 96 (SUTURE) ×4 IMPLANT
SUT PLAIN 2 0 XLH (SUTURE) ×2 IMPLANT
SUT VIC AB 0 CTX 36 (SUTURE) ×2
SUT VIC AB 0 CTX36XBRD ANBCTRL (SUTURE) ×2 IMPLANT
SUT VIC AB 2-0 CT1 36 (SUTURE) ×6 IMPLANT

## 2015-12-17 NOTE — Transfer of Care (Signed)
Immediate Anesthesia Transfer of Care Note  Patient: Yvette Wilson  Procedure(s) Performed: Procedure(s): CESAREAN SECTION WITH BILATERAL TUBAL LIGATION (N/A)  Patient Location: PACU  Anesthesia Type:Spinal  Level of Consciousness: awake, alert  and oriented  Airway & Oxygen Therapy: Patient Spontanous Breathing  Post-op Assessment: Report given to RN and Post -op Vital signs reviewed and stable  Post vital signs: Reviewed and stable  Last Vitals:  Vitals:   12/17/15 0854 12/17/15 1108  BP: (!) 151/90 131/75  Pulse: 68 65  Resp:  10  Temp:  36.7 C    Last Pain:  Vitals:   12/17/15 1108  TempSrc: Oral  PainSc: 0-No pain         Complications: No apparent anesthesia complications

## 2015-12-17 NOTE — Anesthesia Preprocedure Evaluation (Signed)
Anesthesia Evaluation  Patient identified by MRN, date of birth, ID band Patient awake    Reviewed: Allergy & Precautions, NPO status , Patient's Chart, lab work & pertinent test results  History of Anesthesia Complications Negative for: history of anesthetic complications  Airway Mallampati: III       Dental   Pulmonary neg pulmonary ROS, former smoker,           Cardiovascular hypertension, Pt. on medications and Pt. on home beta blockers      Neuro/Psych Anxiety negative neurological ROS     GI/Hepatic negative GI ROS, Neg liver ROS,   Endo/Other  diabetes (gestational, diet controlled)  Renal/GU negative Renal ROS     Musculoskeletal   Abdominal   Peds  Hematology negative hematology ROS (+)   Anesthesia Other Findings   Reproductive/Obstetrics                             Anesthesia Physical Anesthesia Plan  ASA: II  Anesthesia Plan: Spinal   Post-op Pain Management:    Induction:   Airway Management Planned:   Additional Equipment:   Intra-op Plan:   Post-operative Plan:   Informed Consent: I have reviewed the patients History and Physical, chart, labs and discussed the procedure including the risks, benefits and alternatives for the proposed anesthesia with the patient or authorized representative who has indicated his/her understanding and acceptance.     Plan Discussed with:   Anesthesia Plan Comments:         Anesthesia Quick Evaluation

## 2015-12-17 NOTE — Anesthesia Procedure Notes (Signed)
Spinal  Start time: 12/17/2015 9:48 AM End time: 12/17/2015 9:53 AM Staffing Anesthesiologist: Naomie DeanKEPHART, WILLIAM K Resident/CRNA: Karoline CaldwellSTARR, [place] Kinyon Performed: anesthesiologist  Preanesthetic Checklist Completed: patient identified, site marked, surgical consent, pre-op evaluation, timeout performed, IV checked, risks and benefits discussed and monitors and equipment checked Spinal Block Patient position: sitting Prep: ChloraPrep Patient monitoring: heart rate, cardiac monitor, continuous pulse ox and blood pressure Approach: midline Location: L4-5 Injection technique: single-shot Needle Needle type: Quincke  Needle gauge: 25 G Needle length: 5 cm Assessment Sensory level: T4 Additional Notes Patient tolerated procedure well

## 2015-12-17 NOTE — H&P (Signed)
Date of Initial paper H&P: 12/16/15  History reviewed, patient examined, no change in status, stable for surgery.

## 2015-12-17 NOTE — Op Note (Signed)
Preoperative Diagnosis: 1) 35 y.o. [id number] at 6959w0d 2) Desires permanent surgical sterilization   Postoperative Diagnosis: 1) 35 y.o. [id number] at 159w0d 2) Desires permanent surgical sterilization  Operation Performed: Repeat low transverse C-section via pfannenstiel skin incision and Pomeroy tubal ligation  Anesthesia: Spinal  Primary Surgeon: Vena AustriaAndreas Hatsumi Steinhart, MD  Assistant: Annamarie MajorPaul Harris, MD  Preoperative Antibiotics: 2g ancef  Estimated Blood Loss: 600mL  IV Fluids: 2L  Urine Output:: 50mL  Drains or Tubes: Foley to gravity drainage, ON-Q catheter system  Implants: none  Specimens Removed: none  Complications: none  Intraoperative Findings:  Normal tubes ovaries and uterus.  Delivery resulted in the birth of a liveborn female, APGAR (1 MIN):   APGAR (5 MINS): 9, weight 6lbs 15oz  Patient Condition: stable  Procedure in Detail:  Patient was taken to the operating room were she was administered regional anesthesia.  She was positioned in the supine position, prepped and draped in the  Usual sterile fashion.  Prior to proceeding with the case a time out was performed and the level of anesthetic was checked and noted to be adequate.  Utilizing the scalpel a pfannenstiel skin incision was made 2cm above the pubic symphysis utilizing the patient's pre-existing scar and carried down sharply to the the level of the rectus fascia.  The fascia was incised in the midline using the scalpel and then extended using mayo scissors.  The superior border of the rectus fascia was grasped with two Kocher clamps and the underlying rectus muscles were dissected of the fascia using blunt dissection.  The median raphae was incised using Mayo scissors.   The inferior border of the rectus fascia was dissected of the rectus muscles in a similar fashion.  The midline was identified, the peritoneum was entered bluntly and expanded using manual tractions.  The uterus was noted to be in a none rotated  position.  Next the bladder blade was placed retracting the bladder caudally.  A bladder flap was not created.  A low transverse incision was scored on the lower uterine segment.  The hysterotomy was entered bluntly using the operators finger.  The hysterotomy incision was extended using manual traction.  The operators hand was placed within the hysterotomy position noting the fetus to be within the OA position.  The vertex was grasped, flexed, brought to the incision, and delivered a traumatically using fundal pressure.  The remainder of the body delivered with ease.  The infant was suctioned, cord was clamped and cut before handing off to the awaiting neonatologist.  The placenta was delivered using manual extraction.  The uterus was exteriorized, wiped clean of clots and debris using two moist laps.  The hysterotomy was closed using a two layer closure of 0 Vicryl, with the first being a running locked, the second a vertical imbricating.  The right tuba was grasped in a mid isthmic portion using a Babcock clamp, before being double suture ligated using a 0 chromic wheel.  The intervening nuckel of tube was excised using Metzenbaum scissors.  Complete cross section of tubal ostia visualized, and noted to be hemostatic  This procedure was then repeated in similar fashion for the patient left tube.  On returning uterus to the abdomen the ties did slip.  Both tubes were re-ligated with 2-0 Vicryl, which was run in a locked fashion across the mesosalpinx for hemostasis.    The uterus was returned to the abdomen.  The peritoneal gutters were wiped clean of clots and debris using two moist laps.  The hysterotomy incision and tubal pedicles were re-inspected noted to be hemostatic.  The rectus muscles were inspected noted to be hemostatic.  The superior border of the rectus fascia was grasped with a Kocher clamp.  The ON-Q trocars were then placed 4cm above the superior border of the incision and tunneled subfascially.   The introducers were removed and the catheters were threaded through the sleeves after which the sleeves were removed.  The fascia was closed using a looped #1 PDS in a running fashion taking 1cm by 1cm bites.  The subcutaneous tissue was irrigated using warm saline, hemostasis achieved using the bovie.  The subcutaneous dead space was greater than 3cm and was closed.  The subcutaneous dead space was obliterated by using a 53-T 0 Chromic in a running fashion.  The skin was closed using insorb staples.  Sponge needle and instrument counts were corrects times two.  The patient tolerated the procedure well and was taken to the recovery room in stable condition.

## 2015-12-18 LAB — CBC
HCT: 31.8 % — ABNORMAL LOW (ref 35.0–47.0)
Hemoglobin: 10.7 g/dL — ABNORMAL LOW (ref 12.0–16.0)
MCH: 24.8 pg — AB (ref 26.0–34.0)
MCHC: 33.5 g/dL (ref 32.0–36.0)
MCV: 73.8 fL — ABNORMAL LOW (ref 80.0–100.0)
PLATELETS: 273 10*3/uL (ref 150–440)
RBC: 4.31 MIL/uL (ref 3.80–5.20)
RDW: 17.8 % — ABNORMAL HIGH (ref 11.5–14.5)
WBC: 11.5 10*3/uL — ABNORMAL HIGH (ref 3.6–11.0)

## 2015-12-18 LAB — SURGICAL PATHOLOGY

## 2015-12-18 NOTE — Anesthesia Postprocedure Evaluation (Signed)
Anesthesia Post Note  Patient: Yvette Wilson  Procedure(s) Performed: Procedure(s) (LRB): CESAREAN SECTION WITH BILATERAL TUBAL LIGATION (N/A)  Patient location during evaluation: Mother Baby Anesthesia Type: Spinal Level of consciousness: oriented and awake and alert Pain management: pain level controlled Vital Signs Assessment: post-procedure vital signs reviewed and stable Respiratory status: spontaneous breathing, respiratory function stable and patient connected to nasal cannula oxygen Cardiovascular status: blood pressure returned to baseline and stable Postop Assessment: no headache and no backache Anesthetic complications: no    Last Vitals:  Vitals:   12/17/15 2357 12/18/15 0412  BP: (!) 146/75 (!) 143/72  Pulse: 63 66  Resp: 17 20  Temp: 36.8 C 36.5 C    Last Pain:  Vitals:   12/18/15 0412  TempSrc: Oral  PainSc:                  [name],  [name]

## 2015-12-18 NOTE — Anesthesia Post-op Follow-up Note (Signed)
  Anesthesia Pain Follow-up Note  Patient: Yvette Wilson  Day #: 1  Date of Follow-up: 12/18/2015 Time: 7:41 AM  Last Vitals:  Vitals:   12/17/15 2357 12/18/15 0412  BP: (!) 146/75 (!) 143/72  Pulse: 63 66  Resp: 17 20  Temp: 36.8 C 36.5 C    Level of Consciousness: alert  Pain: 1 /10   Side Effects:None  Catheter Site Exam:clean, dry, no drainage     Plan: D/C from anesthesia care  [name],  Livianna Petraglia A

## 2015-12-18 NOTE — Lactation Note (Signed)
This note was copied from a baby's chart. Lactation Consultation Note  Patient Name: Yvette Wilson ZOXWR'UToday's Date: 12/18/2015     Maternal Data    Feeding Feeding Type: Breast Fed Length of feed: 15 min  LATCH Score/Interventions      I did not observe a feeding at breast today, but mom states baby is  latching and nursing well, she has 2 breast pumps for use at home.              Lactation Tools Discussed/Used     Consult Status      Dyann KiefMarsha D Jourden Gilson 12/18/2015, 8:02 PM

## 2015-12-18 NOTE — Progress Notes (Signed)
Admit Date: 12/17/2015 Today's Date: 12/18/2015  Subjective: Postpartum Day 1: Cesarean Delivery and BTL Patient reports tolerating PO.  Min pain. Leakage around pain pump.  Objective: Vital signs in last 24 hours: Temp:  [97.7 F (36.5 C)-98.7 F (37.1 C)] 97.9 F (36.6 C) (11/03 0749) Pulse Rate:  [51-67] 60 (11/03 0749) Resp:  [10-25] 18 (11/03 0749) BP: (125-168)/(69-89) 125/69 (11/03 0749) SpO2:  [96 %-100 %] 100 % (11/03 0749)  Physical Exam:  General: alert, cooperative and no distress Lochia: appropriate Uterine Fundus: firm Incision: healing well DVT Evaluation: No evidence of DVT seen on physical exam.  Incision dressing removed and replaced over On Q pump with additional skin adhesive.   Recent Labs  12/16/15 1053 12/18/15 0507  HGB 12.3 10.7*  HCT 37.1 31.8*    Assessment/Plan: Status post Cesarean section. Doing well postoperatively.  Continue current care. Monitor incision and drainage (bupivacaine). Breast feeding. Infant doing well.  Yvette Wilson 12/18/2015, 9:27 AM

## 2015-12-19 NOTE — Progress Notes (Signed)
  Subjective:   Pt is doing well on post op day 2. She has c/o increased pain since she has been up and ambulating but it is controlled with PO pain meds and On Q pump. She is voiding without difficulty and has had 2 bowel movements. She is tolerating PO intake and [facility] Pt prefers to stay another day for postpartum/breastfeeding support.  Objective:  Blood pressure 134/80, pulse 60, temperature 98.3 F (36.8 C), temperature source Oral, resp. rate 20, height 5\' 8"  (1.727 m), weight (!) 318 lb (144.2 kg), last menstrual period 03/17/2015, SpO2 100 %, breastfeeding.  General: NAD Pulmonary: no increased work of breathing Abdomen: non-distended, non-tender, fundus firm at level of umbilicus Incision: Open to air and no s/s of infection. On Q pump intact and no leakage Extremities: no edema, no erythema, no tenderness   Assessment:   35 y.o. [id number] postoperativeday # 2   Plan:  1) Acute blood loss anemia - hemodynamically stable and asymptomatic - po ferrous sulfate  2) A+, Rubella Immune, Varicella Immune  3) TDAP status: UTD  4) Breast/Contraception: BTL  5) Disposition: Discharge to home likely tomorrow   Tresea MallGLEDHILL,Jaque Dacy, CNM

## 2015-12-20 MED ORDER — OXYCODONE-ACETAMINOPHEN 5-325 MG PO TABS: 1.0000 | ORAL_TABLET | ORAL | 0 refills | Status: DC | PRN

## 2015-12-20 MED ORDER — IBUPROFEN 600 MG PO TABS: 600.0000 mg | ORAL_TABLET | Freq: Four times a day (QID) | ORAL | 0 refills | Status: DC | PRN

## 2015-12-20 NOTE — Progress Notes (Signed)
Pt to car via wheelchair per staff.

## 2015-12-20 NOTE — Progress Notes (Signed)
D/C instructions provided, pt states understanding, aware of follow up appt. Prescriptions given to pt.   

## 2015-12-20 NOTE — Discharge Summary (Signed)
OB Discharge Summary     Patient Name: Yvette Wilson DOB: 1980/11/01 MRN: [id number]  Date of admission: 12/17/2015 Delivering MD: Vena AustriaAndreas Staebler, MD  Date of Delivery: 12/17/2015  Date of discharge: 12/20/2015  Admitting diagnosis: prior cesarean, desire for permanent sterility Intrauterine pregnancy: 5126w0d     Secondary diagnosis: Chronic Hypertension and Gestational Diabetes diet controlled (A1)     Discharge diagnosis: Term Pregnancy Delivered                                                                                                Post partum procedures:postpartum tubal ligation  Augmentation: none  Complications: None  Hospital course:  Sceduled C/S    35 y.o. yo G4P4004 at 7026w0d was admitted to the hospital 12/17/2015 for scheduled cesarean section with the following indication:Elective Repeat.   Patient delivered a Viable infant.12/17/2015  Details of operation can be found in separate operative note.  [name] had an uncomplicated postpartum course.  She is ambulating, tolerating a regular diet, passing flatus, and urinating well. Patient is discharged home in stable condition on  12/20/15          Physical exam Vitals:   12/19/15 2005 12/20/15 0029 12/20/15 0445 12/20/15 0726  BP: (!) 141/71 136/68 (!) 154/72 (!) 152/77  Pulse: 64 66 70 67  Resp: 18 18 18 14   Temp: 98.3 F (36.8 C) 98.5 F (36.9 C) 97.6 F (36.4 C) 98.5 F (36.9 C)  TempSrc: Oral Oral Oral Oral  SpO2: 94% 96% 99% 100%  Weight:      Height:       General: alert, cooperative and no distress Lochia: appropriate Uterine Fundus: firm Incision: Healing well with no significant drainage DVT Evaluation: No evidence of DVT seen on physical exam.  Labs: Lab Results  Component Value Date   WBC 11.5 (H) 12/18/2015   HGB 10.7 (L) 12/18/2015   HCT 31.8 (L) 12/18/2015   MCV 73.8 (L) 12/18/2015   PLT 273 12/18/2015   CMP Latest Ref Rng & Units 12/17/2015  Glucose 65 - 99 mg/dL 76  BUN 6 - 20  mg/dL <4(U<5(L)  Creatinine 9.810.44 - 1.00 mg/dL 1.910.76  Sodium 478135 - 295145 mmol/L 140  Potassium 3.5 - 5.1 mmol/L 4.8  Chloride 101 - 111 mmol/L 108  CO2 22 - 32 mmol/L 22  Calcium 8.9 - 10.3 mg/dL 6.2(Z8.5(L)  Total Protein 6.5 - 8.1 g/dL 6.6  Total Bilirubin 0.3 - 1.2 mg/dL 0.8  Alkaline Phos 38 - 126 U/L 107  AST 15 - 41 U/L 28  ALT 14 - 54 U/L 15    Discharge instruction: per After Visit Summary.  Medications:    Medication List    STOP taking these medications   ondansetron 4 MG disintegrating tablet Commonly known as:  ZOFRAN ODT     TAKE these medications   atenolol 25 MG tablet Commonly known as:  TENORMIN Take 25 mg by mouth 2 (two) times daily.   ibuprofen 600 MG tablet Commonly known as:  ADVIL,MOTRIN Take 1 tablet (600 mg total) by mouth every 6 (six) hours as needed.  oxyCODONE-acetaminophen 5-325 MG tablet Commonly known as:  PERCOCET/ROXICET Take 1-2 tablets by mouth every 4 (four) hours as needed (pain scale 4-7).   Prenatal Vitamins 0.8 MG tablet Take 1 tablet by mouth daily.       Diet: routine diet  Activity: Advance as tolerated. Pelvic rest for 6 weeks.   Outpatient follow up: Follow-up Information    STAEBLER, Florina OuANDREAS M, MD Follow up in 1 week(s).   Specialty:  Obstetrics and Gynecology Why:  For wound re-check/BP check Contact information: [address] [place] [address] [phone]             Postpartum contraception: Tubal Ligation Rhogam Given postpartum: NA Rubella vaccine given postpartum: Rubella Immune Varicella vaccine given postpartum: Varicella Immune TDaP given antepartum or postpartum: given antepartum  Newborn Data: Live born female  Birth Weight: 6 lb 15.1 oz (3150 g) APGAR: , 9   Baby Feeding: Breast  Disposition:home with mother  SIGNEDObie Dredge:  Linsy Ehresman, CNM 12/20/2015 9:45 AM

## 2016-07-25 ENCOUNTER — Emergency Department
Admission: EM | Admit: 2016-07-25 | Discharge: 2016-07-25 | Disposition: A | Discharge status: Home / Self Care | Payer: Medicaid Other | Attending: Emergency Medicine | Admitting: Emergency Medicine

## 2016-07-25 ENCOUNTER — Encounter: Payer: Self-pay | Admitting: Emergency Medicine

## 2016-07-25 DIAGNOSIS — Y9389 Activity, other specified: Secondary | ICD-10-CM | POA: Insufficient documentation

## 2016-07-25 DIAGNOSIS — S46911A Strain of unspecified muscle, fascia and tendon at shoulder and upper arm level, right arm, initial encounter: Secondary | ICD-10-CM | POA: Insufficient documentation

## 2016-07-25 DIAGNOSIS — Y998 Other external cause status: Secondary | ICD-10-CM | POA: Insufficient documentation

## 2016-07-25 DIAGNOSIS — Z87891 Personal history of nicotine dependence: Secondary | ICD-10-CM | POA: Insufficient documentation

## 2016-07-25 DIAGNOSIS — Y929 Unspecified place or not applicable: Secondary | ICD-10-CM | POA: Insufficient documentation

## 2016-07-25 DIAGNOSIS — X509XXA Other and unspecified overexertion or strenuous movements or postures, initial encounter: Secondary | ICD-10-CM | POA: Insufficient documentation

## 2016-07-25 MED ORDER — TRAMADOL HCL 50 MG PO TABS: 50.0000 mg | ORAL_TABLET | Freq: Four times a day (QID) | ORAL | 0 refills | Status: AC | PRN

## 2016-07-25 MED ORDER — CYCLOBENZAPRINE HCL 10 MG PO TABS: 10.0000 mg | ORAL_TABLET | Freq: Three times a day (TID) | ORAL | 0 refills | Status: DC | PRN

## 2016-07-25 MED ORDER — TRAMADOL HCL 50 MG PO TABS
50.0000 mg | ORAL_TABLET | Freq: Once | ORAL | Status: AC
  Administered 2016-07-25: 50 mg via ORAL
  Filled 2016-07-25: qty 1

## 2016-07-25 MED ORDER — IBUPROFEN 800 MG PO TABS
800.0000 mg | ORAL_TABLET | Freq: Once | ORAL | Status: AC
Start: 2016-07-25 — End: 2016-07-25
  Administered 2016-07-25: 800 mg via ORAL
  Filled 2016-07-25: qty 1

## 2016-07-25 MED ORDER — CYCLOBENZAPRINE HCL 10 MG PO TABS
10.0000 mg | ORAL_TABLET | Freq: Once | ORAL | Status: AC
  Administered 2016-07-25: 10 mg via ORAL
  Filled 2016-07-25: qty 1

## 2016-07-25 MED ORDER — IBUPROFEN 800 MG PO TABS: 800.0000 mg | ORAL_TABLET | Freq: Three times a day (TID) | ORAL | 0 refills | Status: DC | PRN

## 2016-07-25 NOTE — ED Provider Notes (Signed)
[facility] Emergency Department Provider Note   ____________________________________________   First MD Initiated Contact with Patient 07/25/16 [phone]     (approximate)  I have reviewed the triage vital signs and the nursing notes.   HISTORY  Chief Complaint Back Pain    HPI Yvette Wilson is a 36 y.o. female  Patient complaining of Upper back pain with movement onset yesterday. Patient stated back pain for 7 months status  Postpartum.  Patient states pain increases with lifting of the right upper extremity and deep breathing.  Patient rates pain as a 10 over 10. Patient described a pain as "achy". No palliative measures for complaint.   Past Medical History:  Diagnosis Date  . Gestational diabetes    Gestational, diet treated  . History of pre-eclampsia 1999   birth of first child  . Hypertension    with all pregnancies, and then continued after last pregnancy  . Sickle cell trait [facility])     Patient Active Problem List   Diagnosis Date Noted  . Postpartum care following cesarean delivery 12/20/2015  . S/P cesarean section 12/17/2015  . Nausea and vomiting of pregnancy, antepartum 11/09/2015  . Gestational hypertension 11/09/2015  . Indication for care in labor and delivery, antepartum 11/09/2015  . Round ligament pain 10/21/2015  . Anxiety attack 07/07/2014    Past Surgical History:  Procedure Laterality Date  . APPENDECTOMY     Dec 2007  . CESAREAN SECTION     May 2008, breech  . CESAREAN SECTION WITH BILATERAL TUBAL LIGATION N/A 12/17/2015   Procedure: CESAREAN SECTION WITH BILATERAL TUBAL LIGATION;  Surgeon: Vena Austria, MD;  Location: [facility] ORS;  Service: Obstetrics;  Laterality: N/A;    Prior to Admission medications   Medication Sig Start Date End Date Taking? Authorizing Provider  atenolol (TENORMIN) 25 MG tablet Take 25 mg by mouth 2 (two) times daily.     [provider]  cyclobenzaprine (FLEXERIL) 10 MG tablet  Take 1 tablet (10 mg total) by mouth 3 (three) times daily as needed. 07/25/16   Joni Reining, PA-C  ibuprofen (ADVIL,MOTRIN) 600 MG tablet Take 1 tablet (600 mg total) by mouth every 6 (six) hours as needed. 12/20/15   Tresea Mall, CNM  ibuprofen (ADVIL,MOTRIN) 800 MG tablet Take 1 tablet (800 mg total) by mouth every 8 (eight) hours as needed for moderate pain. 07/25/16   Joni Reining, PA-C  oxyCODONE-acetaminophen (PERCOCET/ROXICET) 5-325 MG tablet Take 1-2 tablets by mouth every 4 (four) hours as needed (pain scale 4-7). 12/20/15   Tresea Mall, CNM  Prenatal Multivit-Min-Fe-FA (PRENATAL VITAMINS) 0.8 MG tablet Take 1 tablet by mouth daily. 06/06/15   Sharman Cheek, MD  traMADol (ULTRAM) 50 MG tablet Take 1 tablet (50 mg total) by mouth every 6 (six) hours as needed. 07/25/16 07/25/17  Joni Reining, PA-C    Allergies Penicillins  Family History  Problem Relation Age of Onset  . Cancer Maternal Aunt     Social History Social History  Substance Use Topics  . Smoking status: Former Smoker    Packs/day: 0.50    Years: 3.00    Types: Cigarettes    Quit date: 02/26/2005  . Smokeless tobacco: Never Used  . Alcohol use No    Review of Systems  Constitutional: No fever/chills Eyes: No visual changes. ENT: No sore throat. Cardiovascular: Denies chest pain. Respiratory: Denies shortness of breath. Gastrointestinal: No abdominal pain.  No nausea, no vomiting.  No diarrhea.  No  constipation. Genitourinary: Negative for dysuria. Musculoskeletal: Negative for back pain. Skin: Negative for rash. Neurological: Negative for headaches, focal weakness or numbness. Psychiatric: anxiety Endocrine: Hypertension and  Gestational diabetes Hematological/Lymphatic: Sickle cell trait Allergic/Immunilogical: **}  ____________________________________________   PHYSICAL EXAM:  VITAL SIGNS: ED Triage Vitals [07/25/16 0849]  Enc Vitals Group     BP (!) 154/100     Pulse Rate 69       Resp 20     Temp 98.8 F (37.1 C)     Temp src      SpO2 99 %     Weight 295 lb (133.8 kg)     Height 5\' 8"  (1.727 m)     Head Circumference      Peak Flow      Pain Score 10     Pain Loc      Pain Edu?      Excl. in GC?     Constitutional: Alert and oriented. Well appearing and in no acute distress. Neck: No stridor.  No cervical spine tenderness to palpation. Hematological/Lymphatic/Immunilogical: No cervical lymphadenopathy. Cardiovascular: Normal rate, regular rhythm. Grossly normal heart sounds.  Good peripheral circulation. Elevated blood pressure Respiratory: Normal respiratory effort.  No retractions. Lungs CTAB. Gastrointestinal: Soft and nontender. No distention. No abdominal bruits. No CVA tenderness. Musculoskeletal:  No obvious deformity to the upper back or right upper extremity. Patient has decreased range of motion with overhead reacng and abduction of the left shoulder.Patient has moderate guarding palpation of the medial aspect of the mid scapular muscle area. Neurologic:  Normal speech and language. No gross focal neurologic deficits are appreciated. No gait instability. Skin:  Skin is warm, dry and intact. No rash noted. Psychiatric: Mood and affect are normal. Speech and behavior are normal.  ____________________________________________   LABS (all labs ordered are listed, but only abnormal results are displayed)  Labs Reviewed - No data to display ____________________________________________  EKG   ____________________________________________  RADIOLOGY  No results found.  ____________________________________________   PROCEDURES  Procedure(s) performed: None  Procedures  Critical Care performed: No  ____________________________________________   INITIAL IMPRESSION / ASSESSMENT AND PLAN / ED COURSE  Pertinent labs & imaging results that were available during my care of the patient were reviewed by me and considered in my medical  decision making (see chart for details).   Right scapular muscle strain. Patient given discharge Instructions. Patient advised follow-upwith family clinic if condition persists.      ____________________________________________   FINAL CLINICAL IMPRESSION(S) / ED DIAGNOSES  Final diagnoses:  Muscle strain of right scapular region, initial encounter      NEW MEDICATIONS STARTED DURING THIS VISIT:  New Prescriptions   CYCLOBENZAPRINE (FLEXERIL) 10 MG TABLET    Take 1 tablet (10 mg total) by mouth 3 (three) times daily as needed.   IBUPROFEN (ADVIL,MOTRIN) 800 MG TABLET    Take 1 tablet (800 mg total) by mouth every 8 (eight) hours as needed for moderate pain.   TRAMADOL (ULTRAM) 50 MG TABLET    Take 1 tablet (50 mg total) by mouth every 6 (six) hours as needed.     Note:  This document was prepared using Dragon voice recognition software and may include unintentional dictation errors.    Joni ReiningSmith, Ronald K, PA-C 07/25/16 [id number]    Merrily Brittleifenbark, Neil, MD 07/25/16 1056

## 2016-07-25 NOTE — ED Triage Notes (Signed)
R mid back pain with movement since yesterday. States has had back trouble since birth of son 7 months ago. No new injury. States deep inspiration or movement increase pain however has no SOB.

## 2016-07-25 NOTE — ED Notes (Signed)
See triage note  States she has has lower back pain since the birth of her son..but yesterday developed pain to upper back,under right shoulder area  Pain increases with movement,and deep breathing  Denies any injury or fever

## 2017-10-27 ENCOUNTER — Emergency Department: Payer: Self-pay

## 2017-10-27 ENCOUNTER — Encounter: Payer: Self-pay | Admitting: Emergency Medicine

## 2017-10-27 ENCOUNTER — Other Ambulatory Visit: Payer: Self-pay

## 2017-10-27 ENCOUNTER — Emergency Department
Admission: EM | Admit: 2017-10-27 | Discharge: 2017-10-27 | Disposition: A | Discharge status: Home / Self Care | Payer: Self-pay | Attending: Emergency Medicine | Admitting: Emergency Medicine

## 2017-10-27 DIAGNOSIS — I10 Essential (primary) hypertension: Secondary | ICD-10-CM | POA: Insufficient documentation

## 2017-10-27 DIAGNOSIS — R109 Unspecified abdominal pain: Secondary | ICD-10-CM

## 2017-10-27 DIAGNOSIS — Z87891 Personal history of nicotine dependence: Secondary | ICD-10-CM | POA: Insufficient documentation

## 2017-10-27 DIAGNOSIS — R1084 Generalized abdominal pain: Secondary | ICD-10-CM | POA: Insufficient documentation

## 2017-10-27 DIAGNOSIS — D573 Sickle-cell trait: Secondary | ICD-10-CM | POA: Insufficient documentation

## 2017-10-27 DIAGNOSIS — Z79899 Other long term (current) drug therapy: Secondary | ICD-10-CM | POA: Insufficient documentation

## 2017-10-27 LAB — URINALYSIS, COMPLETE (UACMP) WITH MICROSCOPIC
BILIRUBIN URINE: NEGATIVE
Bacteria, UA: NONE SEEN
GLUCOSE, UA: NEGATIVE mg/dL
HGB URINE DIPSTICK: NEGATIVE
KETONES UR: NEGATIVE mg/dL
LEUKOCYTES UA: NEGATIVE
NITRITE: NEGATIVE
PH: 5 (ref 5.0–8.0)
Protein, ur: NEGATIVE mg/dL
SPECIFIC GRAVITY, URINE: 1.017 (ref 1.005–1.030)

## 2017-10-27 LAB — CBC
HCT: 32.8 % — ABNORMAL LOW (ref 35.0–47.0)
HEMOGLOBIN: 10.7 g/dL — AB (ref 12.0–16.0)
MCH: 22.3 pg — AB (ref 26.0–34.0)
MCHC: 32.7 g/dL (ref 32.0–36.0)
MCV: 68.3 fL — ABNORMAL LOW (ref 80.0–100.0)
Platelets: 301 10*3/uL (ref 150–440)
RBC: 4.81 MIL/uL (ref 3.80–5.20)
RDW: 18.7 % — AB (ref 11.5–14.5)
WBC: 8 10*3/uL (ref 3.6–11.0)

## 2017-10-27 LAB — COMPREHENSIVE METABOLIC PANEL
ALBUMIN: 4 g/dL (ref 3.5–5.0)
ALK PHOS: 58 U/L (ref 38–126)
ALT: 33 U/L (ref 0–44)
ANION GAP: 8 (ref 5–15)
AST: 33 U/L (ref 15–41)
BUN: 11 mg/dL (ref 6–20)
CALCIUM: 8.9 mg/dL (ref 8.9–10.3)
CO2: 27 mmol/L (ref 22–32)
Chloride: 104 mmol/L (ref 98–111)
Creatinine, Ser: 0.92 mg/dL (ref 0.44–1.00)
GFR calc non Af Amer: >60 mL/min (ref >60)
GLUCOSE: 110 mg/dL — AB (ref 70–99)
POTASSIUM: 4 mmol/L (ref 3.5–5.1)
Sodium: 139 mmol/L (ref 135–145)
Total Bilirubin: 0.4 mg/dL (ref 0.3–1.2)
Total Protein: 8.2 g/dL — ABNORMAL HIGH (ref 6.5–8.1)

## 2017-10-27 LAB — PREGNANCY, URINE: Preg Test, Ur: NEGATIVE

## 2017-10-27 MED ORDER — CYCLOBENZAPRINE HCL 5 MG PO TABS: 5.0000 mg | ORAL_TABLET | Freq: Three times a day (TID) | ORAL | 0 refills | Status: DC | PRN

## 2017-10-27 MED ORDER — IBUPROFEN 200 MG PO TABS: 600.0000 mg | ORAL_TABLET | Freq: Four times a day (QID) | ORAL | 0 refills | Status: DC | PRN

## 2017-10-27 MED ORDER — KETOROLAC TROMETHAMINE 30 MG/ML IJ SOLN
15.0000 mg | Freq: Once | INTRAMUSCULAR | Status: AC
  Administered 2017-10-27: 15 mg via INTRAVENOUS
  Filled 2017-10-27: qty 1

## 2017-10-27 NOTE — ED Notes (Signed)
20G PIV removed from L AM, tip intact. Bleeding controlled.

## 2017-10-27 NOTE — ED Provider Notes (Addendum)
[facility] Emergency Department Provider Note  ____________________________________________   I have reviewed the triage vital signs and the nursing notes. Where available I have reviewed prior notes and, if possible and indicated, outside hospital notes.    HISTORY  Chief Complaint Flank Pain    HPI Yvette Wilson is a 37 y.o. female with a history of obesity, BTL, gestational hypertension, anxiety, presents today with back pain.  Patient states she had back pain yesterday and some nausea, no urinary symptoms no fever no chills.  Today the back pain became much worse while she was at rest.  Sharp pain radiating towards her right flank.  She denies any numbness or weakness or incontinence of bowel or bladder no trauma no fall.  She states that the pain is a sharp pain.  No other alleviating or aggravating symptoms.  Came right here soon as it started.  No other associated symptoms.     Past Medical History:  Diagnosis Date  . Gestational diabetes    Gestational, diet treated  . History of pre-eclampsia 1999   birth of first child  . Hypertension    with all pregnancies, and then continued after last pregnancy  . Sickle cell trait [facility])     Patient Active Problem List   Diagnosis Date Noted  . Postpartum care following cesarean delivery 12/20/2015  . S/P cesarean section 12/17/2015  . Nausea and vomiting of pregnancy, antepartum 11/09/2015  . Gestational hypertension 11/09/2015  . Indication for care in labor and delivery, antepartum 11/09/2015  . Round ligament pain 10/21/2015  . Anxiety attack 07/07/2014    Past Surgical History:  Procedure Laterality Date  . APPENDECTOMY     Dec 2007  . CESAREAN SECTION     May 2008, breech  . CESAREAN SECTION WITH BILATERAL TUBAL LIGATION N/A 12/17/2015   Procedure: CESAREAN SECTION WITH BILATERAL TUBAL LIGATION;  Surgeon: Vena Austria, MD;  Location: [facility] ORS;  Service: Obstetrics;  Laterality: N/A;     Prior to Admission medications   Medication Sig Start Date End Date Taking? Authorizing Provider  atenolol (TENORMIN) 25 MG tablet Take 25 mg by mouth 2 (two) times daily.     [provider]  cyclobenzaprine (FLEXERIL) 10 MG tablet Take 1 tablet (10 mg total) by mouth 3 (three) times daily as needed. 07/25/16   Joni Reining, PA-C  ibuprofen (ADVIL,MOTRIN) 600 MG tablet Take 1 tablet (600 mg total) by mouth every 6 (six) hours as needed. 12/20/15   Tresea Mall, CNM  ibuprofen (ADVIL,MOTRIN) 800 MG tablet Take 1 tablet (800 mg total) by mouth every 8 (eight) hours as needed for moderate pain. 07/25/16   Joni Reining, PA-C  oxyCODONE-acetaminophen (PERCOCET/ROXICET) 5-325 MG tablet Take 1-2 tablets by mouth every 4 (four) hours as needed (pain scale 4-7). 12/20/15   Tresea Mall, CNM  Prenatal Multivit-Min-Fe-FA (PRENATAL VITAMINS) 0.8 MG tablet Take 1 tablet by mouth daily. 06/06/15   Sharman Cheek, MD    Allergies Penicillins  Family History  Problem Relation Age of Onset  . Cancer Maternal Aunt     Social History Social History   Tobacco Use  . Smoking status: Former Smoker    Packs/day: 0.50    Years: 3.00    Pack years: 1.50    Types: Cigarettes    Last attempt to quit: 02/26/2005    Years since quitting: 12.6  . Smokeless tobacco: Never Used  Substance Use Topics  . Alcohol use: No  . Drug  use: No    Review of Systems Constitutional: No fever/chills Eyes: No visual changes. ENT: No sore throat. No stiff neck no neck pain Cardiovascular: Denies chest pain. Respiratory: Denies shortness of breath. Gastrointestinal:   no vomiting.  No diarrhea.  No constipation. Genitourinary: Negative for dysuria. Musculoskeletal: Negative lower extremity swelling Skin: Negative for rash. Neurological: Negative for severe headaches, focal weakness or numbness.   ____________________________________________   PHYSICAL EXAM:  VITAL SIGNS: ED Triage Vitals  [10/27/17 0938]  Enc Vitals Group     BP      Pulse Rate 65     Resp 20     Temp 98 F (36.7 C)     Temp Source Oral     SpO2 97 %     Weight 295 lb (133.8 kg)     Height 5' 8.5" (1.74 m)     Head Circumference      Peak Flow      Pain Score 10     Pain Loc      Pain Edu?      Excl. in GC?     Constitutional: Alert and oriented. Well appearing and in no acute distress. Eyes: Conjunctivae are normal Head: Atraumatic HEENT: No congestion/rhinnorhea. Mucous membranes are moist.  Oropharynx non-erythematous Neck:   Nontender with no meningismus, no masses, no stridor Cardiovascular: Normal rate, regular rhythm. Grossly normal heart sounds.  Good peripheral circulation. Respiratory: Normal respiratory effort.  No retractions. Lungs CTAB. Abdominal: Soft and nontender. No distention. No guarding no rebound Back: There is paraspinal tenderness noted to the lumbar region with no shingles or herpetic lesions.  His pain does seem to radiate towards the right CVA.  No other pathology noted no lesions Musculoskeletal: No lower extremity tenderness, no upper extremity tenderness. No joint effusions, no DVT signs strong distal pulses no edema Neurologic:  Normal speech and language. No gross focal neurologic deficits are appreciated.  Skin:  Skin is warm, dry and intact. No rash noted. Psychiatric: Mood and affect are normal. Speech and behavior are normal.  ____________________________________________   LABS (all labs ordered are listed, but only abnormal results are displayed)  Labs Reviewed  CBC - Abnormal; Notable for the following components:      Result Value   Hemoglobin 10.7 (*)    HCT 32.8 (*)    MCV 68.3 (*)    MCH 22.3 (*)    RDW 18.7 (*)    All other components within normal limits  COMPREHENSIVE METABOLIC PANEL - Abnormal; Notable for the following components:   Glucose, Bld 110 (*)    Total Protein 8.2 (*)    All other components within normal limits  URINALYSIS,  COMPLETE (UACMP) WITH MICROSCOPIC - Abnormal; Notable for the following components:   Color, Urine YELLOW (*)    APPearance HAZY (*)    All other components within normal limits  PREGNANCY, URINE  POC URINE PREG, ED    Pertinent labs  results that were available during my care of the patient were reviewed by me and considered in my medical decision making (see chart for details). ____________________________________________  EKG  I personally interpreted any EKGs ordered by me or triage  ____________________________________________  RADIOLOGY  Pertinent labs & imaging results that were available during my care of the patient were reviewed by me and considered in my medical decision making (see chart for details). If possible, patient and/or family made aware of any abnormal findings.  No results found. ____________________________________________  PROCEDURES  Procedure(s) performed: None  Procedures  Critical Care performed: None  ____________________________________________   INITIAL IMPRESSION / ASSESSMENT AND PLAN / ED COURSE  Pertinent labs & imaging results that were available during my care of the patient were reviewed by me and considered in my medical decision making (see chart for details).  Patient with reproducible back pain is mostly paraspinal but does also radiate towards the flank.  Urine and blood work are negative she is not pregnant, given her degree of discomfort will obtain CT scan to rule out occult kidney stone or other pathology.  We will also give the patient pain medication.  She has an absolute benign abdomen at this time with no evidence of referred abdominal pain causing this reproducible back pain.  Most likely this is musculoskeletal      ----------------------------------------- 1:01 PM on 10/27/2017 -----------------------------------------  Patient states her pain is nearly gone after Toradol, work-up is quite reassuring.  I did make  her aware of the incidental findings on CT scan and the need for follow-up.  She will be CCed on those copies on discharge.  Patient states she feels much better.  No numbness no weakness no incontinence of bowel or bladder nothing to suggest that she is got a spinal process such as abscess or hematoma or spinal trauma.  CT scan is otherwise reassuring.  Patient states she feels under percent better.  Could have passed a small stone but urine and blood work are not necessary consistent with that.  She has no chest pain or pleuritic pain I do not think this represent a low PE or other such pathology.  We will discharge with close outpatient follow-up.  Patient very comfortable with this plan. ____________________________________________   FINAL CLINICAL IMPRESSION(S) / ED DIAGNOSES  Final diagnoses:  None      This chart was dictated using voice recognition software.  Despite best efforts to proofread,  errors can occur which can change meaning.      Jeanmarie Plant, MD 10/27/17 1121    Jeanmarie Plant, MD 10/27/17 1302

## 2017-10-27 NOTE — Discharge Instructions (Addendum)
Return to the emergency room for any new or worrisome symptoms including numbness, weakness, incontinence of bowel or bladder, fever, chills, worsening pain, shortness of breath, chest pain, or any other new or worrisome symptoms.  Please follow closely with your doctor take the medications as prescribed and do not drive while taking Flexeril.  We do notice a very small area on your liver.  It is the same roughly as it has been since 2012.  Please bring this report to your doctor for further care and assessment as needed.  They do recommend ultrasound or MRI for this.   An incidental finding of potential clinical significance has been found. 3.9 cm low-density area or lesion in the posterior right hepatic lobe. There was probably a similar structure in this area since 2012. Favor a benign etiology but recommend further characterization due to the size. Consider starting with a right upper quadrant ultrasound to see if this is a distinct lesion versus fatty change. This area could be most definitively characterized with a liver MRI.**

## 2017-10-27 NOTE — ED Notes (Signed)

## 2017-10-27 NOTE — ED Triage Notes (Signed)
R flank pain began yesterday.

## 2018-07-20 ENCOUNTER — Other Ambulatory Visit: Payer: Self-pay

## 2018-07-20 ENCOUNTER — Emergency Department
Admission: EM | Admit: 2018-07-20 | Discharge: 2018-07-20 | Disposition: A | Discharge status: Home / Self Care | Payer: Self-pay | Attending: Emergency Medicine | Admitting: Emergency Medicine

## 2018-07-20 DIAGNOSIS — Z20828 Contact with and (suspected) exposure to other viral communicable diseases: Secondary | ICD-10-CM | POA: Insufficient documentation

## 2018-07-20 DIAGNOSIS — Z79899 Other long term (current) drug therapy: Secondary | ICD-10-CM | POA: Insufficient documentation

## 2018-07-20 DIAGNOSIS — R070 Pain in throat: Secondary | ICD-10-CM | POA: Insufficient documentation

## 2018-07-20 DIAGNOSIS — J029 Acute pharyngitis, unspecified: Secondary | ICD-10-CM

## 2018-07-20 DIAGNOSIS — Z87891 Personal history of nicotine dependence: Secondary | ICD-10-CM | POA: Insufficient documentation

## 2018-07-20 DIAGNOSIS — I10 Essential (primary) hypertension: Secondary | ICD-10-CM | POA: Insufficient documentation

## 2018-07-20 DIAGNOSIS — Z7189 Other specified counseling: Secondary | ICD-10-CM

## 2018-07-20 MED ORDER — LIDOCAINE VISCOUS HCL 2 % MT SOLN: 15.0000 mL | OROMUCOSAL | 0 refills | Status: DC | PRN

## 2018-07-20 NOTE — Discharge Instructions (Addendum)
Person Under Monitoring Name: Yvette Wilson  Location: [address] [place] [address]   Infection Prevention Recommendations for Individuals Confirmed to have, or Being Evaluated for, 2019 Novel Coronavirus (COVID-19) Infection Who Receive Care at Home  Individuals who are confirmed to have, or are being evaluated for, COVID-19 should follow the prevention steps below until a healthcare provider or local or state health department says they can return to normal activities.  Stay home except to get medical care You should restrict activities outside your home, except for getting medical care. Do not go to work, school, or public areas, and do not use public transportation or taxis.  Call ahead before visiting your doctor Before your medical appointment, call the healthcare provider and tell them that you have, or are being evaluated for, COVID-19 infection. This will help the healthcare providers office take steps to keep other people from getting infected. Ask your healthcare provider to call the local or state health department.  Monitor your symptoms Seek prompt medical attention if your illness is worsening (e.g., difficulty breathing). Before going to your medical appointment, call the healthcare provider and tell them that you have, or are being evaluated for, COVID-19 infection. Ask your healthcare provider to call the local or state health department.  Wear a facemask You should wear a facemask that covers your nose and mouth when you are in the same room with other people and when you visit a healthcare provider. People who live with or visit you should also wear a facemask while they are in the same room with you.  Separate yourself from other people in your home As much as possible, you should stay in a different room from other people in your home. Also, you should use a separate bathroom, if available.  Avoid sharing household items You should not  share dishes, drinking glasses, cups, eating utensils, towels, bedding, or other items with other people in your home. After using these items, you should wash them thoroughly with soap and water.  Cover your coughs and sneezes Cover your mouth and nose with a tissue when you cough or sneeze, or you can cough or sneeze into your sleeve. Throw used tissues in a lined trash can, and immediately wash your hands with soap and water for at least 20 seconds or use an alcohol-based hand rub.  Wash your [employer] your hands often and thoroughly with soap and water for at least 20 seconds. You can use an alcohol-based hand sanitizer if soap and water are not available and if your hands are not visibly dirty. Avoid touching your eyes, nose, and mouth with unwashed hands.   Prevention Steps for Caregivers and Household Members of Individuals Confirmed to have, or Being Evaluated for, COVID-19 Infection Being Cared for in the Home  If you live with, or provide care at home for, a person confirmed to have, or being evaluated for, COVID-19 infection please follow these guidelines to prevent infection:  Follow healthcare providers instructions Make sure that you understand and can help the patient follow any healthcare provider instructions for all care.  Provide for the patients basic needs You should help the patient with basic needs in the home and provide support for getting groceries, prescriptions, and other personal needs.  Monitor the patients symptoms If they are getting sicker, call his or her medical provider and tell them that the patient has, or is being evaluated for, COVID-19 infection. This will help the healthcare  providers office take steps to keep other people from getting infected. Ask the healthcare provider to call the local or state health department.  Limit the number of people who have contact with the patient If possible, have only one caregiver for the  patient. Other household members should stay in another home or place of residence. If this is not possible, they should stay in another room, or be separated from the patient as much as possible. Use a separate bathroom, if available. Restrict visitors who do not have an essential need to be in the home.  Keep older adults, very young children, and other sick people away from the patient Keep older adults, very young children, and those who have compromised immune systems or chronic health conditions away from the patient. This includes people with chronic heart, lung, or kidney conditions, diabetes, and cancer.  Ensure good ventilation Make sure that shared spaces in the home have good air flow, such as from an air conditioner or an opened window, weather permitting.  Wash your hands often Wash your hands often and thoroughly with soap and water for at least 20 seconds. You can use an alcohol based hand sanitizer if soap and water are not available and if your hands are not visibly dirty. Avoid touching your eyes, nose, and mouth with unwashed hands. Use disposable paper towels to dry your hands. If not available, use dedicated cloth towels and replace them when they become wet.  Wear a facemask and gloves Wear a disposable facemask at all times in the room and gloves when you touch or have contact with the patients blood, body fluids, and/or secretions or excretions, such as sweat, saliva, sputum, nasal mucus, vomit, urine, or feces.  Ensure the mask fits over your nose and mouth tightly, and do not touch it during use. Throw out disposable facemasks and gloves after using them. Do not reuse. Wash your hands immediately after removing your facemask and gloves. If your personal clothing becomes contaminated, carefully remove clothing and launder. Wash your hands after handling contaminated clothing. Place all used disposable facemasks, gloves, and other waste in a lined container before  disposing them with other household waste. Remove gloves and wash your hands immediately after handling these items.  Do not share dishes, glasses, or other household items with the patient Avoid sharing household items. You should not share dishes, drinking glasses, cups, eating utensils, towels, bedding, or other items with a patient who is confirmed to have, or being evaluated for, COVID-19 infection. After the person uses these items, you should wash them thoroughly with soap and water.  Wash laundry thoroughly Immediately remove and wash clothes or bedding that have blood, body fluids, and/or secretions or excretions, such as sweat, saliva, sputum, nasal mucus, vomit, urine, or feces, on them. Wear gloves when handling laundry from the patient. Read and follow directions on labels of laundry or clothing items and detergent. In general, wash and dry with the warmest temperatures recommended on the label.  Clean all areas the individual has used often Clean all touchable surfaces, such as counters, tabletops, doorknobs, bathroom fixtures, toilets, phones, keyboards, tablets, and bedside tables, every day. Also, clean any surfaces that may have blood, body fluids, and/or secretions or excretions on them. Wear gloves when cleaning surfaces the patient has come in contact with. Use a diluted bleach solution (e.g., dilute bleach with 1 part bleach and 10 parts water) or a household disinfectant with a label that says EPA-registered for coronaviruses. To make  a bleach solution at home, add 1 tablespoon of bleach to 1 quart (4 cups) of water. For a larger supply, add  cup of bleach to 1 gallon (16 cups) of water. Read labels of cleaning products and follow recommendations provided on product labels. Labels contain instructions for safe and effective use of the cleaning product including precautions you should take when applying the product, such as wearing gloves or eye protection and making sure you  have good ventilation during use of the product. Remove gloves and wash hands immediately after cleaning.  Monitor yourself for signs and symptoms of illness Caregivers and household members are considered close contacts, should monitor their health, and will be asked to limit movement outside of the home to the extent possible. Follow the monitoring steps for close contacts listed on the symptom monitoring form.   ? If you have additional questions, contact your local health department or call the epidemiologist on call at [phone] (available 24/7). ? This guidance is subject to change. For the most up-to-date guidance from [facility], please refer to their website: [email or web address]

## 2018-07-20 NOTE — ED Triage Notes (Signed)
Pt c/o sore throat for the past 4 days. Denies cough body aches or any other sx.

## 2018-07-20 NOTE — ED Provider Notes (Signed)
[facility] Emergency Department Provider Note  ____________________________________________  Time seen: Approximately 11:06 AM  I have reviewed the triage vital signs and the nursing notes.   HISTORY  Chief Complaint Sore Throat    HPI Yvette Wilson is a 38 y.o. female with a history of hypertension who complains of sore throat for the past 4 days.  Painful to swallow, associated with decreased energy level.  Denies fevers chills cough shortness of breath chest pain or body aches.  Eating and drinking normally.  Symptoms have been constant, gradually worsening and severe over the last 4 days.  No recent sick contacts.      Past Medical History:  Diagnosis Date  . Gestational diabetes    Gestational, diet treated  . History of pre-eclampsia 1999   birth of first child  . Hypertension    with all pregnancies, and then continued after last pregnancy  . Sickle cell trait [facility](HCC)      Patient Active Problem List   Diagnosis Date Noted  . Postpartum care following cesarean delivery 12/20/2015  . S/P cesarean section 12/17/2015  . Nausea and vomiting of pregnancy, antepartum 11/09/2015  . Gestational hypertension 11/09/2015  . Indication for care in labor and delivery, antepartum 11/09/2015  . Round ligament pain 10/21/2015  . Anxiety attack 07/07/2014     Past Surgical History:  Procedure Laterality Date  . APPENDECTOMY     Dec 2007  . CESAREAN SECTION     May 2008, breech  . CESAREAN SECTION WITH BILATERAL TUBAL LIGATION N/A 12/17/2015   Procedure: CESAREAN SECTION WITH BILATERAL TUBAL LIGATION;  Surgeon: Vena AustriaAndreas Staebler, MD;  Location: [facility] ORS;  Service: Obstetrics;  Laterality: N/A;     Prior to Admission medications   Medication Sig Start Date End Date Taking? Authorizing Provider  atenolol (TENORMIN) 25 MG tablet Take 25 mg by mouth 2 (two) times daily.     [provider]  cyclobenzaprine (FLEXERIL) 5 MG tablet Take 1 tablet  (5 mg total) by mouth 3 (three) times daily as needed for muscle spasms. 10/27/17   Jeanmarie PlantMcShane, James A, MD  ibuprofen (MOTRIN IB) 200 MG tablet Take 3 tablets (600 mg total) by mouth every 6 (six) hours as needed. 10/27/17   Jeanmarie PlantMcShane, James A, MD  lidocaine (XYLOCAINE) 2 % solution Use as directed 15 mLs in the mouth or throat every 2 (two) hours as needed for mouth pain. Gargle and spit out 07/20/18   Sharman CheekStafford, Tyniah Kastens, MD  oxyCODONE-acetaminophen (PERCOCET/ROXICET) 5-325 MG tablet Take 1-2 tablets by mouth every 4 (four) hours as needed (pain scale 4-7). 12/20/15   Tresea MallGledhill, Jane, CNM  Prenatal Multivit-Min-Fe-FA (PRENATAL VITAMINS) 0.8 MG tablet Take 1 tablet by mouth daily. 06/06/15   Sharman CheekStafford, Alexza Norbeck, MD     Allergies Penicillins   Family History  Problem Relation Age of Onset  . Cancer Maternal Aunt     Social History Social History   Tobacco Use  . Smoking status: Former Smoker    Packs/day: 0.50    Years: 3.00    Pack years: 1.50    Types: Cigarettes    Last attempt to quit: 02/26/2005    Years since quitting: 13.4  . Smokeless tobacco: Never Used  Substance Use Topics  . Alcohol use: No  . Drug use: No    Review of Systems  Constitutional:   No fever or chills.  ENT:   Positive sore throat. No rhinorrhea. Cardiovascular:   No chest pain or syncope. Respiratory:  No dyspnea or cough. Gastrointestinal:   Negative for abdominal pain, vomiting and diarrhea.  Musculoskeletal:   Negative for focal pain or swelling All other systems reviewed and are negative except as documented above in ROS and HPI.  ____________________________________________   PHYSICAL EXAM:  VITAL SIGNS: ED Triage Vitals  Enc Vitals Group     BP 07/20/18 0803 (!) 196/135     Pulse Rate 07/20/18 0803 73     Resp 07/20/18 0803 17     Temp 07/20/18 0803 98.1 F (36.7 C)     Temp Source 07/20/18 0803 Oral     SpO2 07/20/18 0803 100 %     Weight 07/20/18 0800 300 lb (136.1 kg)     Height 07/20/18  0800  (1.727 m)     Head Circumference --      Peak Flow --      Pain Score 07/20/18 0800 0     Pain Loc --      Pain Edu? --      Excl. in GC? --     Vital signs reviewed, nursing assessments reviewed.   Constitutional:   Alert and oriented. Non-toxic appearance. Eyes:   Conjunctivae are normal. EOMI. PERRL. ENT      Head:   Normocephalic and atraumatic.      Nose:   No congestion/rhinnorhea.       Mouth/Throat:   MMM, no pharyngeal erythema. No peritonsillar mass.  No tonsillar exudates      Neck:   No meningismus. Full ROM. Hematological/Lymphatic/Immunilogical:   Mild right cervical lymphadenopathy. Cardiovascular:   RRR. Symmetric bilateral radial and DP pulses.  No murmurs. Cap refill less than 2 seconds. Respiratory:   Normal respiratory effort without tachypnea/retractions. Breath sounds are clear and equal bilaterally. No wheezes/rales/rhonchi. Musculoskeletal:   Normal range of motion in all extremities. No joint effusions.  No lower extremity tenderness.  No edema. Neurologic:   Normal speech and language.  Motor grossly intact. No acute focal neurologic deficits are appreciated.   ____________________________________________    LABS (pertinent positives/negatives) (all labs ordered are listed, but only abnormal results are displayed) Labs Reviewed  NOVEL CORONAVIRUS, NAA (HOSPITAL ORDER, SEND-OUT TO REF LAB)   ____________________________________________   EKG    ____________________________________________    RADIOLOGY  No results found.  ____________________________________________   PROCEDURES Procedures  ____________________________________________    CLINICAL IMPRESSION / ASSESSMENT AND PLAN / ED COURSE  Medications ordered in the ED: Medications - No data to display  Pertinent labs & imaging results that were available during my care of the patient were reviewed by me and considered in my medical decision making (see chart for  details).  Yvette Wilson was evaluated in Emergency Department on 07/20/2018 for the symptoms described in the history of present illness. She was evaluated in the context of the global COVID-19 pandemic, which necessitated consideration that the patient might be at risk for infection with the SARS-CoV-2 virus that causes COVID-19. Institutional protocols and algorithms that pertain to the evaluation of patients at risk for COVID-19 are in a state of rapid change based on information released by regulatory bodies including the CDC and federal and state organizations. These policies and algorithms were followed during the patient's care in the ED.   Patient presents with sore throat, possibly related to GERD but more likely viral.  Counseled patient on possible COVID-19 infection.  Will send a COVID test to [facility].  Patient will isolate at home pending results.  She  is not having any high risk exposure or high risk symptoms for transmission, so I think a negative swab allows her to return to work.      ____________________________________________   FINAL CLINICAL IMPRESSION(S) / ED DIAGNOSES    Final diagnoses:  Sore throat  Advice Given About Covid-19 Virus Infection     ED Discharge Orders         Ordered    lidocaine (XYLOCAINE) 2 % solution  Every 2 hours PRN     07/20/18 1105          Portions of this note were generated with dragon dictation software. Dictation errors may occur despite best attempts at proofreading.   Sharman Cheek, MD 07/20/18 1108

## 2018-07-21 LAB — NOVEL CORONAVIRUS, NAA (HOSP ORDER, SEND-OUT TO REF LAB; TAT 18-24 HRS): SARS-CoV-2, NAA: NOT DETECTED

## 2018-07-24 ENCOUNTER — Telehealth: Payer: Self-pay | Admitting: Emergency Medicine

## 2018-07-24 NOTE — Telephone Encounter (Addendum)
Called patient to give result of covid 19 .  Left message.   07/25/18--patient called me back and I gave her result.

## 2018-08-13 ENCOUNTER — Emergency Department
Admission: EM | Admit: 2018-08-13 | Discharge: 2018-08-13 | Disposition: A | Discharge status: Home / Self Care | Payer: Self-pay | Attending: Emergency Medicine | Admitting: Emergency Medicine

## 2018-08-13 ENCOUNTER — Emergency Department: Payer: Self-pay

## 2018-08-13 ENCOUNTER — Other Ambulatory Visit: Payer: Self-pay

## 2018-08-13 DIAGNOSIS — Y929 Unspecified place or not applicable: Secondary | ICD-10-CM | POA: Insufficient documentation

## 2018-08-13 DIAGNOSIS — Y9389 Activity, other specified: Secondary | ICD-10-CM | POA: Insufficient documentation

## 2018-08-13 DIAGNOSIS — Z8632 Personal history of gestational diabetes: Secondary | ICD-10-CM | POA: Insufficient documentation

## 2018-08-13 DIAGNOSIS — I10 Essential (primary) hypertension: Secondary | ICD-10-CM | POA: Insufficient documentation

## 2018-08-13 DIAGNOSIS — Z79899 Other long term (current) drug therapy: Secondary | ICD-10-CM | POA: Insufficient documentation

## 2018-08-13 DIAGNOSIS — S4992XA Unspecified injury of left shoulder and upper arm, initial encounter: Secondary | ICD-10-CM | POA: Insufficient documentation

## 2018-08-13 DIAGNOSIS — X509XXA Other and unspecified overexertion or strenuous movements or postures, initial encounter: Secondary | ICD-10-CM | POA: Insufficient documentation

## 2018-08-13 DIAGNOSIS — Y998 Other external cause status: Secondary | ICD-10-CM | POA: Insufficient documentation

## 2018-08-13 DIAGNOSIS — Z87891 Personal history of nicotine dependence: Secondary | ICD-10-CM | POA: Insufficient documentation

## 2018-08-13 MED ORDER — DICLOFENAC SODIUM 50 MG PO TBEC: 50.0000 mg | DELAYED_RELEASE_TABLET | Freq: Two times a day (BID) | ORAL | 0 refills | Status: DC

## 2018-08-13 MED ORDER — KETOROLAC TROMETHAMINE 30 MG/ML IJ SOLN
30.0000 mg | Freq: Once | INTRAMUSCULAR | Status: AC
  Administered 2018-08-13: 30 mg via INTRAMUSCULAR
  Filled 2018-08-13: qty 1

## 2018-08-13 NOTE — ED Notes (Signed)
See triage note  Presents with pain to left arm/elbow  States she had a fall last week  Good pulses

## 2018-08-13 NOTE — Discharge Instructions (Signed)
Your x-ray was negative for fracture.  Please take diclofenac for pain and inflammation.  You can use shoulder sling as needed at work.  Please call Dr. Posey Pronto for an appointment for follow-up.  Please take your blood pressure medication as prescribed.  Your blood pressure was elevated in the emergency department, which puts you at a high risk for a stroke or a heart attack.

## 2018-08-13 NOTE — ED Triage Notes (Signed)
Pt arrives with complaints of left elbow pain that radiates to her left hand. Pt reports mechanical injury to it last Tuesday. No obvious deformity.

## 2018-08-13 NOTE — ED Provider Notes (Signed)
[facility] Emergency Department Provider Note  ____________________________________________  Time seen: Approximately 10:32 AM  I have reviewed the triage vital signs and the nursing notes.   HISTORY  Chief Complaint Arm Injury    HPI Yvette Wilson is a 38 y.o. female that presents to the emergency department for evaluation of left elbow and forearm pain after injury last Tuesday.  Patient states that she was trying to prevent her child from falling and pulled her elbow.  She initially felt a sharp pain to elbow after incident.  She continues to have pain that shoots into her forearm.  She has minimal pain without movement.  She has pain with supination and pronation.  She has not felt a knot.  She has not been able to lift up her child since injury due to pain.  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  She took ibuprofen for the first 2 days.  [facility] in the past for a rotator cuff injury.   Past Medical History:  Diagnosis Date  . Gestational diabetes    Gestational, diet treated  . History of pre-eclampsia 1999   birth of first child  . Hypertension    with all pregnancies, and then continued after last pregnancy  . Sickle cell trait [facility])     Patient Active Problem List   Diagnosis Date Noted  . Postpartum care following cesarean delivery 12/20/2015  . S/P cesarean section 12/17/2015  . Nausea and vomiting of pregnancy, antepartum 11/09/2015  . Gestational hypertension 11/09/2015  . Indication for care in labor and delivery, antepartum 11/09/2015  . Round ligament pain 10/21/2015  . Anxiety attack 07/07/2014    Past Surgical History:  Procedure Laterality Date  . APPENDECTOMY     Dec 2007  . CESAREAN SECTION     May 2008, breech  . CESAREAN SECTION WITH BILATERAL TUBAL LIGATION N/A 12/17/2015   Procedure: CESAREAN SECTION WITH BILATERAL TUBAL LIGATION;  Surgeon: Malachy Mood, MD;  Location: [facility] ORS;   Service: Obstetrics;  Laterality: N/A;    Prior to Admission medications   Medication Sig Start Date End Date Taking? Authorizing Provider  atenolol (TENORMIN) 25 MG tablet Take 25 mg by mouth 2 (two) times daily.     [provider]  cyclobenzaprine (FLEXERIL) 5 MG tablet Take 1 tablet (5 mg total) by mouth 3 (three) times daily as needed for muscle spasms. 10/27/17   Schuyler Amor, MD  diclofenac (VOLTAREN) 50 MG EC tablet Take 1 tablet (50 mg total) by mouth 2 (two) times daily. 08/13/18   Laban Emperor, PA-C  ibuprofen (MOTRIN IB) 200 MG tablet Take 3 tablets (600 mg total) by mouth every 6 (six) hours as needed. 10/27/17   Schuyler Amor, MD    Allergies Penicillins  Family History  Problem Relation Age of Onset  . Cancer Maternal Aunt     Social History Social History   Tobacco Use  . Smoking status: Former Smoker    Packs/day: 0.50    Years: 3.00    Pack years: 1.50    Types: Cigarettes    Quit date: 02/26/2005    Years since quitting: 13.4  . Smokeless tobacco: Never Used  Substance Use Topics  . Alcohol use: No  . Drug use: No     Review of Systems  Constitutional: No fever/chills Cardiovascular: No chest pain. Respiratory: No SOB. Gastrointestinal: No nausea, no vomiting.  Musculoskeletal: Positive for arm pain.  Skin: Negative for rash, abrasions, lacerations, ecchymosis.   ____________________________________________   PHYSICAL EXAM:  VITAL SIGNS: ED Triage Vitals  Enc Vitals Group     BP 08/13/18 0818 (!) 193/116     Pulse Rate 08/13/18 0817 78     Resp 08/13/18 0817 16     Temp 08/13/18 0817 98.7 F (37.1 C)     Temp Source 08/13/18 0817 Oral     SpO2 08/13/18 0817 98 %     Weight 08/13/18 0817 290 lb (131.5 kg)     Height 08/13/18 0817 5\' 8"  (1.727 m)     Head Circumference --      Peak Flow --      Pain Score 08/13/18 0817 8     Pain Loc --      Pain Edu? --      Excl. in GC? --      Constitutional: Alert and oriented.  Well appearing and in no acute distress. Eyes: Conjunctivae are normal. PERRL. EOMI. Head: Atraumatic. ENT:      Ears:      Nose: No congestion/rhinnorhea.      Mouth/Throat: Mucous membranes are moist.  Neck: No stridor.   Cardiovascular: Normal rate, regular rhythm.  Good peripheral circulation. Respiratory: Normal respiratory effort without tachypnea or retractions. Lungs CTAB. Good air entry to the bases with no decreased or absent breath sounds. Musculoskeletal: Full range of motion to all extremities. No gross deformities appreciated.  Pain with supination and pronation of left elbow.  No tenderness to palpation with flexion of elbow.  Tenderness to palpation with extension of elbow and resisted extension of elbow.  Tenderness to palpation to proximal forearm.  No mass or bulge.  No erythema. Neurologic:  Normal speech and language. No gross focal neurologic deficits are appreciated.  Skin:  Skin is warm, dry and intact. No rash noted. Psychiatric: Mood and affect are normal. Speech and behavior are normal. Patient exhibits appropriate insight and judgement.   ____________________________________________   LABS (all labs ordered are listed, but only abnormal results are displayed)  Labs Reviewed - No data to display ____________________________________________  EKG   ____________________________________________  RADIOLOGY Lexine BatonI, Tiegan Jambor, personally viewed and evaluated these images (plain radiographs) as part of my medical decision making, as well as reviewing the written report by the radiologist.  Dg Elbow Complete Left  Result Date: 08/13/2018 CLINICAL DATA:  Pt arrives with complaints of left elbow pain that radiates to her left hand. Pt reports mechanical injury to it last Tuesday while catching 38 year old son who was falling from bed. No obvious deformity. Pt shieldedpain EXAM: LEFT ELBOW - COMPLETE 3+ VIEW COMPARISON:  None. FINDINGS: No evidence of fracture of the  ulna or humerus. The radial head is normal. No joint effusion. IMPRESSION: No fracture or dislocation. Electronically Signed   By: Genevive BiStewart  Edmunds M.D.   On: 08/13/2018 09:08    ____________________________________________    PROCEDURES  Procedure(s) performed:    Procedures    Medications  ketorolac (TORADOL) 30 MG/ML injection 30 mg (30 mg Intramuscular Given 08/13/18 1034)     ____________________________________________   INITIAL IMPRESSION / ASSESSMENT AND PLAN / ED COURSE  Pertinent labs & imaging results that were available during my care of the patient were reviewed by me and considered in my medical decision making (see chart for details).  Review of the [place] CSRS was performed in accordance of the [facility] prior to dispensing any controlled drugs.     Patient  presents emergency department for evaluation after arm injury.  Vital signs and exam are reassuring.  X-ray negative for acute bony abnormalities.  Gums are consistent with a muscle injury.  I do not suspect a rupture.  Patient was given IM Toradol for plain.  Patient blood pressure was elevated in the emergency department and she has not taking her blood pressure medications today.  Patient was reminded to take her blood pressure medication as prescribed and educated about the risks of high blood pressure.  Patient will be discharged home with prescriptions for diclofenac. Patient is to follow up with primary care and Ortho as directed.  Referral was given to Dr. Allena KatzPatel.  Patient is given ED precautions to return to the ED for any worsening or new symptoms.   Yvette Wilson was evaluated in Emergency Department on 08/13/2018 for the symptoms described in the history of present illness. She was evaluated in the context of the global COVID-19 pandemic, which necessitated consideration that the patient might be at risk for infection with the SARS-CoV-2 virus that causes COVID-19. Institutional protocols and algorithms that  pertain to the evaluation of patients at risk for COVID-19 are in a state of rapid change based on information released by regulatory bodies including the CDC and federal and state organizations. These policies and algorithms were followed during the patient's care in the ED.  ____________________________________________  FINAL CLINICAL IMPRESSION(S) / ED DIAGNOSES  Final diagnoses:  Injury of left upper extremity, initial encounter      NEW MEDICATIONS STARTED DURING THIS VISIT:  ED Discharge Orders         Ordered    diclofenac (VOLTAREN) 50 MG EC tablet  2 times daily     08/13/18 1038              This chart was dictated using voice recognition software/Dragon. Despite best efforts to proofread, errors can occur which can change the meaning. Any change was purely unintentional.    Enid DerryWagner, Jaggar Benko, PA-C 08/13/18 1105    Jene EveryKinner, Robert, MD 08/13/18 [phone]

## 2018-12-24 ENCOUNTER — Encounter: Payer: Self-pay | Admitting: Emergency Medicine

## 2018-12-24 ENCOUNTER — Other Ambulatory Visit: Payer: Self-pay

## 2018-12-24 ENCOUNTER — Emergency Department
Admission: EM | Admit: 2018-12-24 | Discharge: 2018-12-24 | Disposition: A | Discharge status: Home / Self Care | Payer: Self-pay | Attending: Emergency Medicine | Admitting: Emergency Medicine

## 2018-12-24 DIAGNOSIS — Z79899 Other long term (current) drug therapy: Secondary | ICD-10-CM | POA: Insufficient documentation

## 2018-12-24 DIAGNOSIS — Z87891 Personal history of nicotine dependence: Secondary | ICD-10-CM | POA: Insufficient documentation

## 2018-12-24 DIAGNOSIS — I10 Essential (primary) hypertension: Secondary | ICD-10-CM | POA: Insufficient documentation

## 2018-12-24 DIAGNOSIS — L02411 Cutaneous abscess of right axilla: Secondary | ICD-10-CM | POA: Insufficient documentation

## 2018-12-24 MED ORDER — CEPHALEXIN 500 MG PO CAPS: 500.0000 mg | ORAL_CAPSULE | Freq: Three times a day (TID) | ORAL | 0 refills | Status: AC

## 2018-12-24 MED ORDER — SULFAMETHOXAZOLE-TRIMETHOPRIM 800-160 MG PO TABS: 1.0000 | ORAL_TABLET | Freq: Two times a day (BID) | ORAL | 0 refills | Status: DC

## 2018-12-24 MED ORDER — HYDROCODONE-ACETAMINOPHEN 5-325 MG PO TABS: 1.0000 | ORAL_TABLET | Freq: Four times a day (QID) | ORAL | 0 refills | Status: DC | PRN

## 2018-12-24 NOTE — ED Triage Notes (Signed)
Pt reports abscess under her right arm since yesterday. Pt states painful to wear clothes and put her arm down.

## 2018-12-24 NOTE — ED Notes (Signed)
See triage note   Presents with possible abscess area under right arm

## 2018-12-24 NOTE — ED Provider Notes (Signed)
[facility] Emergency Department Provider Note  ____________________________________________   First MD Initiated Contact with Patient 12/24/18 [phone]     (approximate)  I have reviewed the triage vital signs and the nursing notes.   HISTORY  Chief Complaint Abscess    HPI Yvette Wilson is a 38 y.o. female presents emergency department complaining of a possible abscess under the right arm.  States area is very painful.  She tried to put on her scrub top this morning and was rubbing too much and caused pain.  No change in deodorants.   She denies fever or chills.   Past Medical History:  Diagnosis Date   Gestational diabetes    Gestational, diet treated   History of pre-eclampsia 1999   birth of first child   Hypertension    with all pregnancies, and then continued after last pregnancy   Sickle cell trait [facility](HCC)     Patient Active Problem List   Diagnosis Date Noted   Postpartum care following cesarean delivery 12/20/2015   S/P cesarean section 12/17/2015   Nausea and vomiting of pregnancy, antepartum 11/09/2015   Gestational hypertension 11/09/2015   Indication for care in labor and delivery, antepartum 11/09/2015   Round ligament pain 10/21/2015   Anxiety attack 07/07/2014    Past Surgical History:  Procedure Laterality Date   APPENDECTOMY     Dec 2007   CESAREAN SECTION     May 2008, breech   CESAREAN SECTION WITH BILATERAL TUBAL LIGATION N/A 12/17/2015   Procedure: CESAREAN SECTION WITH BILATERAL TUBAL LIGATION;  Surgeon: Vena AustriaAndreas Staebler, MD;  Location: [facility] ORS;  Service: Obstetrics;  Laterality: N/A;    Prior to Admission medications   Medication Sig Start Date End Date Taking? Authorizing Provider  atenolol (TENORMIN) 25 MG tablet Take 25 mg by mouth 2 (two) times daily.     [provider]  cephALEXin (KEFLEX) 500 MG capsule Take 1 capsule (500 mg total) by mouth 3 (three) times daily for 10 days. 12/24/18  01/03/19  Sylvia Helms, Roselyn BeringSusan W, PA-C  cyclobenzaprine (FLEXERIL) 5 MG tablet Take 1 tablet (5 mg total) by mouth 3 (three) times daily as needed for muscle spasms. 10/27/17   Jeanmarie PlantMcShane, James A, MD  diclofenac (VOLTAREN) 50 MG EC tablet Take 1 tablet (50 mg total) by mouth 2 (two) times daily. 08/13/18   Enid DerryWagner, Ashley, PA-C  HYDROcodone-acetaminophen (NORCO/VICODIN) 5-325 MG tablet Take 1 tablet by mouth every 6 (six) hours as needed for moderate pain. 12/24/18   Esperansa Sarabia, Roselyn BeringSusan W, PA-C  sulfamethoxazole-trimethoprim (BACTRIM DS) 800-160 MG tablet Take 1 tablet by mouth 2 (two) times daily. 12/24/18   Faythe GheeFisher, Atiyah Bauer W, PA-C    Allergies Penicillins  Family History  Problem Relation Age of Onset   Cancer Maternal Aunt     Social History Social History   Tobacco Use   Smoking status: Former Smoker    Packs/day: 0.50    Years: 3.00    Pack years: 1.50    Types: Cigarettes    Quit date: 02/26/2005    Years since quitting: 13.8   Smokeless tobacco: Never Used  Substance Use Topics   Alcohol use: No   Drug use: No    Review of Systems  Constitutional: No fever/chills Eyes: No visual changes. ENT: No sore throat. Respiratory: Denies cough Genitourinary: Negative for dysuria. Musculoskeletal: Negative for back pain. Skin: Negative for rash.  Questionable abscess to the right axilla    ____________________________________________   PHYSICAL EXAM:  VITAL  SIGNS: ED Triage Vitals  Enc Vitals Group     BP 12/24/18 0851 (!) 164/88     Pulse Rate 12/24/18 0851 (!) 58     Resp 12/24/18 0851 20     Temp 12/24/18 0851 98.7 F (37.1 C)     Temp Source 12/24/18 0851 Oral     SpO2 12/24/18 0851 99 %     Weight 12/24/18 0850 (!) 306 lb (138.8 kg)     Height 12/24/18 0850 5\' 8"  (1.727 m)     Head Circumference --      Peak Flow --      Pain Score 12/24/18 0850 9     Pain Loc --      Pain Edu? --      Excl. in GC? --     Constitutional: Alert and oriented. Well appearing and in no  acute distress. Eyes: Conjunctivae are normal.  Head: Atraumatic. Nose: No congestion/rhinnorhea. Mouth/Throat: Mucous membranes are moist.   Neck:  supple no lymphadenopathy noted Cardiovascular: Normal rate, regular rhythm.  Respiratory: Normal respiratory effort.  No retractions,  GU: deferred Musculoskeletal: FROM all extremities, warm and well perfused Neurologic:  Normal speech and language.  Skin:  Skin is warm, dry and intact. No rash noted.  Positive for hard tender area in the right axilla.  Area is not fluctuant.  No redness is noted.  Is very tender to palpation. Psychiatric: Mood and affect are normal. Speech and behavior are normal.  ____________________________________________   LABS (all labs ordered are listed, but only abnormal results are displayed)  Labs Reviewed - No data to display ____________________________________________   ____________________________________________  RADIOLOGY    ____________________________________________   PROCEDURES  Procedure(s) performed: No  Procedures    ____________________________________________   INITIAL IMPRESSION / ASSESSMENT AND PLAN / ED COURSE  Pertinent labs & imaging results that were available during my care of the patient were reviewed by me and considered in my medical decision making (see chart for details).   Patient is a 38 year old female presents emergency department complaining of possible abscess to the right axilla.  See HPI.  Physical exam shows a hard indurated area in the right axilla.  No fluctuance.  No redness.  Explained findings to the patient.  I do not feel that the area is ready to be drained at this time.  She is placed on Septra and Keflex.  Vicodin for pain.  She was instructed to return tomorrow if worsening.  Return in 2 days for recheck.  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  Is discharged stable condition.    Yvette Wilson was evaluated in Emergency  Department on 12/24/2018 for the symptoms described in the history of present illness. She was evaluated in the context of the global COVID-19 pandemic, which necessitated consideration that the patient might be at risk for infection with the SARS-CoV-2 virus that causes COVID-19. Institutional protocols and algorithms that pertain to the evaluation of patients at risk for COVID-19 are in a state of rapid change based on information released by regulatory bodies including the CDC and federal and state organizations. These policies and algorithms were followed during the patient's care in the ED.   As part of my medical decision making, I reviewed the following data within the electronic MEDICAL RECORD NUMBER Nursing notes reviewed and incorporated, Old chart reviewed, Notes from prior ED visits and [place] Controlled Substance Database  ____________________________________________   FINAL CLINICAL IMPRESSION(S) /  ED DIAGNOSES  Final diagnoses:  Abscess of axilla, right      NEW MEDICATIONS STARTED DURING THIS VISIT:  New Prescriptions   CEPHALEXIN (KEFLEX) 500 MG CAPSULE    Take 1 capsule (500 mg total) by mouth 3 (three) times daily for 10 days.   HYDROCODONE-ACETAMINOPHEN (NORCO/VICODIN) 5-325 MG TABLET    Take 1 tablet by mouth every 6 (six) hours as needed for moderate pain.   SULFAMETHOXAZOLE-TRIMETHOPRIM (BACTRIM DS) 800-160 MG TABLET    Take 1 tablet by mouth 2 (two) times daily.     Note:  This document was prepared using Dragon voice recognition software and may include unintentional dictation errors.    Versie Starks, PA-C 12/24/18 1012    Earleen Newport, MD 12/24/18 1025

## 2018-12-24 NOTE — Discharge Instructions (Signed)
Take your medication as prescribed.  Return in 2 days for recheck.  If worsening overnight and the area become soft and enlarged we can lance it.  Apply a warm compress to the area as much as possible.  This will help soften the abscess.

## 2019-03-08 ENCOUNTER — Ambulatory Visit: Payer: Self-pay | Admitting: Obstetrics and Gynecology

## 2019-05-06 ENCOUNTER — Encounter (HOSPITAL_COMMUNITY): Payer: Self-pay | Admitting: Emergency Medicine

## 2019-05-06 ENCOUNTER — Emergency Department (HOSPITAL_COMMUNITY): Payer: Self-pay

## 2019-05-06 ENCOUNTER — Emergency Department (HOSPITAL_COMMUNITY)
Admission: EM | Admit: 2019-05-06 | Discharge: 2019-05-06 | Discharge status: Against Medical Advice | Payer: Self-pay | Attending: Emergency Medicine | Admitting: Emergency Medicine

## 2019-05-06 DIAGNOSIS — I10 Essential (primary) hypertension: Secondary | ICD-10-CM | POA: Insufficient documentation

## 2019-05-06 DIAGNOSIS — N939 Abnormal uterine and vaginal bleeding, unspecified: Secondary | ICD-10-CM

## 2019-05-06 DIAGNOSIS — Z79899 Other long term (current) drug therapy: Secondary | ICD-10-CM | POA: Insufficient documentation

## 2019-05-06 DIAGNOSIS — R1030 Lower abdominal pain, unspecified: Secondary | ICD-10-CM | POA: Insufficient documentation

## 2019-05-06 DIAGNOSIS — Z87891 Personal history of nicotine dependence: Secondary | ICD-10-CM | POA: Insufficient documentation

## 2019-05-06 LAB — CBC
HCT: 37.3 % (ref 36.0–46.0)
Hemoglobin: 11.3 g/dL — ABNORMAL LOW (ref 12.0–15.0)
MCH: 21.4 pg — ABNORMAL LOW (ref 26.0–34.0)
MCHC: 30.3 g/dL (ref 30.0–36.0)
MCV: 70.5 fL — ABNORMAL LOW (ref 80.0–100.0)
Platelets: 358 10*3/uL (ref 150–400)
RBC: 5.29 MIL/uL — ABNORMAL HIGH (ref 3.87–5.11)
RDW: 18.6 % — ABNORMAL HIGH (ref 11.5–15.5)
WBC: 8.4 10*3/uL (ref 4.0–10.5)
nRBC: 0 % (ref 0.0–0.2)

## 2019-05-06 LAB — COMPREHENSIVE METABOLIC PANEL
ALT: 17 U/L (ref 0–44)
AST: 15 U/L (ref 15–41)
Albumin: 3.8 g/dL (ref 3.5–5.0)
Alkaline Phosphatase: 55 U/L (ref 38–126)
Anion gap: 9 (ref 5–15)
BUN: 13 mg/dL (ref 6–20)
CO2: 25 mmol/L (ref 22–32)
Calcium: 9.1 mg/dL (ref 8.9–10.3)
Chloride: 106 mmol/L (ref 98–111)
Creatinine, Ser: 0.96 mg/dL (ref 0.44–1.00)
GFR calc Af Amer: >60 mL/min (ref >60)
GFR calc non Af Amer: >60 mL/min (ref >60)
Glucose, Bld: 94 mg/dL (ref 70–99)
Potassium: 4.2 mmol/L (ref 3.5–5.1)
Sodium: 140 mmol/L (ref 135–145)
Total Bilirubin: 0.4 mg/dL (ref 0.3–1.2)
Total Protein: 7.7 g/dL (ref 6.5–8.1)

## 2019-05-06 LAB — URINALYSIS, ROUTINE W REFLEX MICROSCOPIC
Bilirubin Urine: NEGATIVE
Glucose, UA: NEGATIVE mg/dL
Hgb urine dipstick: NEGATIVE
Ketones, ur: NEGATIVE mg/dL
Leukocytes,Ua: NEGATIVE
Nitrite: NEGATIVE
Protein, ur: NEGATIVE mg/dL
Specific Gravity, Urine: 1.015 (ref 1.005–1.030)
pH: 5 (ref 5.0–8.0)

## 2019-05-06 LAB — I-STAT BETA HCG BLOOD, ED (MC, WL, AP ONLY): I-stat hCG, quantitative: <5 m[IU]/mL (ref <5)

## 2019-05-06 LAB — LIPASE, BLOOD: Lipase: 20 U/L (ref 11–51)

## 2019-05-06 MED ORDER — KETOROLAC TROMETHAMINE 60 MG/2ML IM SOLN: 60.0000 mg | Freq: Once | INTRAMUSCULAR | Status: DC

## 2019-05-06 MED ORDER — SODIUM CHLORIDE 0.9% FLUSH: 3.0000 mL | Freq: Once | INTRAVENOUS | Status: DC

## 2019-05-06 NOTE — ED Provider Notes (Signed)
[facility] EMERGENCY DEPARTMENT Provider Note   CSN: [id number] Arrival date & time: 05/06/19  1343     History Chief Complaint  Patient presents with  . Abdominal Pain    Yvette Wilson is a 39 y.o. female.  HPI  39 year old [id number] presents to the ED for severe abdominal pain that started yesterday morning.  Patient refers that she was on the toilet and had a sudden onset of severe 7/10 abdominal and back pain that prevented her from standing up.  Her husband had to help her stand up, after which the patient went to lay down and after about 3 hours the pain subsided.  Describes her pain as "labor pains".  Patient had a tubal ligation 3 years ago and C-sections..  About a year ago she has had increasing pain with her menstruals which is accompanied by severe back pain and nausea.  She has a history of ovarian cysts but no other uterine pathology. She attempted to take 800 mg of ibuprofen yesterday which dulled her to pain but did not make it go away.  She states her pain has never gotten this bad.  Her menstrual cycle normally starts at the very end of the month, normally about the 25th, 26, 27th and she believes she should be starting her menstrual cycle soon.  She denies bleeding at this time.  Sexually active with one partner, they do not use protection.  She has had her appendix removed.  Last bowel movement was this morning which was normal.   She endorses nausea, but no vomiting, fevers, diarrhea, pain with urination, dysuria, abnormal vaginal discharge or smells, flank pain, chest pain, palpitations, syncope    Past Medical History:  Diagnosis Date  . Gestational diabetes    Gestational, diet treated  . History of pre-eclampsia 1999   birth of first child  . Hypertension    with all pregnancies, and then continued after last pregnancy  . Sickle cell trait [facility])     Patient Active Problem List   Diagnosis Date Noted  . Postpartum care following cesarean  delivery 12/20/2015  . S/P cesarean section 12/17/2015  . Nausea and vomiting of pregnancy, antepartum 11/09/2015  . Gestational hypertension 11/09/2015  . Indication for care in labor and delivery, antepartum 11/09/2015  . Round ligament pain 10/21/2015  . Anxiety attack 07/07/2014    Past Surgical History:  Procedure Laterality Date  . APPENDECTOMY     Dec 2007  . CESAREAN SECTION     May 2008, breech  . CESAREAN SECTION WITH BILATERAL TUBAL LIGATION N/A 12/17/2015   Procedure: CESAREAN SECTION WITH BILATERAL TUBAL LIGATION;  Surgeon: Vena Austria, MD;  Location: [facility] ORS;  Service: Obstetrics;  Laterality: N/A;     OB History    Gravida  4   Para  4   Term  4   Preterm      AB      Living  4     SAB      TAB      Ectopic      Multiple  0   Live Births  1           Family History  Problem Relation Age of Onset  . Cancer Maternal Aunt     Social History   Tobacco Use  . Smoking status: Former Smoker    Packs/day: 0.50    Years: 3.00    Pack years: 1.50    Types: Cigarettes  Quit date: 02/26/2005    Years since quitting: 14.1  . Smokeless tobacco: Never Used  Substance Use Topics  . Alcohol use: No  . Drug use: No    Home Medications Prior to Admission medications   Medication Sig Start Date End Date Taking? Authorizing Provider  atenolol (TENORMIN) 25 MG tablet Take 25 mg by mouth 2 (two) times daily.     [provider]  cyclobenzaprine (FLEXERIL) 5 MG tablet Take 1 tablet (5 mg total) by mouth 3 (three) times daily as needed for muscle spasms. 10/27/17   Schuyler Amor, MD  diclofenac (VOLTAREN) 50 MG EC tablet Take 1 tablet (50 mg total) by mouth 2 (two) times daily. 08/13/18   Laban Emperor, PA-C  HYDROcodone-acetaminophen (NORCO/VICODIN) 5-325 MG tablet Take 1 tablet by mouth every 6 (six) hours as needed for moderate pain. 12/24/18   Fisher, Linden Dolin, PA-C  sulfamethoxazole-trimethoprim (BACTRIM DS) 800-160 MG tablet Take  1 tablet by mouth 2 (two) times daily. 12/24/18   Versie Starks, PA-C    Allergies    Penicillins  Review of Systems   Review of Systems  Constitutional: Negative for activity change, appetite change, chills, fatigue and fever.  Gastrointestinal: Positive for abdominal pain and nausea. Negative for abdominal distention, blood in stool, constipation, diarrhea and vomiting.  Genitourinary: Positive for menstrual problem. Negative for difficulty urinating, dysuria, flank pain, frequency, pelvic pain, vaginal bleeding, vaginal discharge and vaginal pain.  Musculoskeletal: Positive for back pain.  Skin: Negative for rash.  Neurological: Negative for dizziness.  Psychiatric/Behavioral: Negative.   All other systems reviewed and are negative.   Physical Exam Updated Vital Signs BP (!) 156/83 (BP Location: Right Arm)   Pulse 73   Temp 98 F (36.7 C) (Oral)   Resp 17   Ht 5\' 8"  (1.727 m)   Wt 136.1 kg   SpO2 100%   BMI 45.61 kg/m   Physical Exam Constitutional:      Appearance: She is obese.  HENT:     Head: Normocephalic and atraumatic.  Cardiovascular:     Rate and Rhythm: Normal rate and regular rhythm.  Pulmonary:     Effort: Pulmonary effort is normal.     Breath sounds: Normal breath sounds.  Abdominal:     General: Abdomen is flat. Bowel sounds are normal.     Palpations: Abdomen is soft.     Tenderness: There is generalized abdominal tenderness. There is no right CVA tenderness, left CVA tenderness or guarding. Negative signs include Murphy's sign, Rovsing's sign and McBurney's sign.  Skin:    General: Skin is warm and dry.  Neurological:     General: No focal deficit present.     Mental Status: She is alert.     ED Results / Procedures / Treatments   Labs (all labs ordered are listed, but only abnormal results are displayed) Labs Reviewed  CBC - Abnormal; Notable for the following components:      Result Value   RBC 5.29 (*)    Hemoglobin 11.3 (*)    MCV  70.5 (*)    MCH 21.4 (*)    RDW 18.6 (*)    All other components within normal limits  WET PREP, GENITAL  LIPASE, BLOOD  COMPREHENSIVE METABOLIC PANEL  URINALYSIS, ROUTINE W REFLEX MICROSCOPIC  I-STAT BETA HCG BLOOD, ED ([facility], [facility], [facility] ONLY)  GC/CHLAMYDIA PROBE AMP ([place]) NOT AT [facility]    EKG None  Radiology No results found.  Procedures Procedures (  including critical care time)  Medications Ordered in ED Medications  sodium chloride flush (NS) 0.9 % injection 3 mL (has no administration in time range)  ketorolac (TORADOL) injection 60 mg (has no administration in time range)    ED Course  I have reviewed the triage vital signs and the nursing notes.  Pertinent labs & imaging results that were available during my care of the patient were reviewed by me and considered in my medical decision making (see chart for details).    MDM Rules/Calculators/A&P                       39 year old female [id number] with a history of ovarian cyst presents to the ED for lower abdominal pain. -Vital signs on arriva arrival show mild hypertension but otherwise unremarkable. -Negative pregnancy test, labs are not concerning for infection, show  mild anemia.  CMP normal and shows no electrolyte imbalances.  No evidence of UTI. -She received a Toradol injection for pain in the ED. -I discussed with the patient about ordering a pelvic exam/wet prep and a pelvic ultrasound.  She was agreeable to this plan.  Around 5:49 PM the patient left AMA stating that her son got injured at home.  States that she plans on returning for further testing.  I was not able to perform the ultrasound and pelvic exam.  The nurse discussed risks of leaving AMA and was given return precautions.    Final Clinical Impression(s) / ED Diagnoses Final diagnoses:  Lower abdominal pain    Rx / DC Orders ED Discharge Orders    None       Leone Brand 05/06/19 Serita Grammes, MD 05/06/19  [phone]

## 2019-05-06 NOTE — ED Triage Notes (Signed)
Rt sided and back pain abd pain since yesterday  Nausea , no vomiting  Denies dysuria  States has heavy bleeding from periods x last 3 months no bleeding now

## 2019-05-06 NOTE — ED Notes (Signed)
Pt stated she had to leave, since her young son got injured at home. Says she plans to come back tomorrow for further testing. At this time, pelvic and [place] not yet done. Advised of risks leaving AMA, and told to return for worsening symptoms

## 2019-05-07 ENCOUNTER — Emergency Department (HOSPITAL_COMMUNITY)
Admission: EM | Admit: 2019-05-07 | Discharge: 2019-05-07 | Disposition: A | Discharge status: Home / Self Care | Payer: Self-pay | Attending: Emergency Medicine | Admitting: Emergency Medicine

## 2019-05-07 ENCOUNTER — Other Ambulatory Visit: Payer: Self-pay

## 2019-05-07 DIAGNOSIS — Z87891 Personal history of nicotine dependence: Secondary | ICD-10-CM | POA: Insufficient documentation

## 2019-05-07 DIAGNOSIS — N946 Dysmenorrhea, unspecified: Secondary | ICD-10-CM | POA: Insufficient documentation

## 2019-05-07 DIAGNOSIS — I10 Essential (primary) hypertension: Secondary | ICD-10-CM | POA: Insufficient documentation

## 2019-05-07 DIAGNOSIS — R1030 Lower abdominal pain, unspecified: Secondary | ICD-10-CM | POA: Insufficient documentation

## 2019-05-07 DIAGNOSIS — Z79899 Other long term (current) drug therapy: Secondary | ICD-10-CM | POA: Insufficient documentation

## 2019-05-07 LAB — COMPREHENSIVE METABOLIC PANEL
ALT: 20 U/L (ref 0–44)
AST: 43 U/L — ABNORMAL HIGH (ref 15–41)
Albumin: 3.5 g/dL (ref 3.5–5.0)
Alkaline Phosphatase: 56 U/L (ref 38–126)
Anion gap: 9 (ref 5–15)
BUN: 17 mg/dL (ref 6–20)
CO2: 26 mmol/L (ref 22–32)
Calcium: 8.8 mg/dL — ABNORMAL LOW (ref 8.9–10.3)
Chloride: 104 mmol/L (ref 98–111)
Creatinine, Ser: 1.05 mg/dL — ABNORMAL HIGH (ref 0.44–1.00)
GFR calc Af Amer: >60 mL/min (ref >60)
GFR calc non Af Amer: >60 mL/min (ref >60)
Glucose, Bld: 116 mg/dL — ABNORMAL HIGH (ref 70–99)
Potassium: 5.1 mmol/L (ref 3.5–5.1)
Sodium: 139 mmol/L (ref 135–145)
Total Bilirubin: 1.6 mg/dL — ABNORMAL HIGH (ref 0.3–1.2)
Total Protein: 7.5 g/dL (ref 6.5–8.1)

## 2019-05-07 LAB — I-STAT BETA HCG BLOOD, ED (MC, WL, AP ONLY): I-stat hCG, quantitative: <5 m[IU]/mL (ref <5)

## 2019-05-07 LAB — WET PREP, GENITAL
Clue Cells Wet Prep HPF POC: NONE SEEN
Sperm: NONE SEEN
Trich, Wet Prep: NONE SEEN
Yeast Wet Prep HPF POC: NONE SEEN

## 2019-05-07 LAB — CBC
HCT: 36.8 % (ref 36.0–46.0)
Hemoglobin: 11.4 g/dL — ABNORMAL LOW (ref 12.0–15.0)
MCH: 21.7 pg — ABNORMAL LOW (ref 26.0–34.0)
MCHC: 31 g/dL (ref 30.0–36.0)
MCV: 70.1 fL — ABNORMAL LOW (ref 80.0–100.0)
Platelets: 369 10*3/uL (ref 150–400)
RBC: 5.25 MIL/uL — ABNORMAL HIGH (ref 3.87–5.11)
RDW: 18.5 % — ABNORMAL HIGH (ref 11.5–15.5)
WBC: 10.7 10*3/uL — ABNORMAL HIGH (ref 4.0–10.5)
nRBC: 0 % (ref 0.0–0.2)

## 2019-05-07 LAB — LIPASE, BLOOD: Lipase: 22 U/L (ref 11–51)

## 2019-05-07 MED ORDER — SODIUM CHLORIDE 0.9% FLUSH: 3.0000 mL | Freq: Once | INTRAVENOUS | Status: DC

## 2019-05-07 MED ORDER — IBUPROFEN 800 MG PO TABS: 800.0000 mg | ORAL_TABLET | Freq: Three times a day (TID) | ORAL | 0 refills | Status: DC | PRN

## 2019-05-07 NOTE — ED Triage Notes (Signed)
Pt here for evaluation of continued central and right abdominal pain since Sunday. Seen for same but LWBS. Endorses nausea without vomiting, denies diarrhea. Last BM this morning, normal.

## 2019-05-07 NOTE — ED Provider Notes (Signed)
[facility] EMERGENCY DEPARTMENT Provider Note   CSN: [id number] Arrival date & time: 05/07/19  0844     History Chief Complaint  Patient presents with  . Abdominal Pain    Yvette Wilson is a 39 y.o. female.  39 year old female with past medical history below including hypertension who presents with abdominal pain. Patient presented here 2 days ago for lower abdominal pain and low back pain that she has been dealing with intermittently for the past year. She notes that over the past year, she has been having problems with severe cramping and associated dull lower abdominal pain that starts the week before menstruation and continues through the first 2 to 3 days of menstruation, during which time she has very heavy bleeding and passage of clots. Her pain then seems to improve and for 2 or 3 weeks she is okay, then the cycle starts up again. Pain became more severe at home 2 days ago which is what prompted her to come to the ED. She states that it felt like mild labor pains. She thought that she would have started her period by now but is not bleeding and she denies any vaginal discharge. She has associated nausea with her symptoms but no vomiting, diarrhea, urinary symptoms, fevers, or recent illness. She has not seen an OB/GYN since she had her tubes tied. She occasionally takes ibuprofen for symptoms but doesn't like taking medications.  The history is provided by the patient.  Abdominal Pain      Past Medical History:  Diagnosis Date  . Gestational diabetes    Gestational, diet treated  . History of pre-eclampsia 1999   birth of first child  . Hypertension    with all pregnancies, and then continued after last pregnancy  . Sickle cell trait [facility])     Patient Active Problem List   Diagnosis Date Noted  . Postpartum care following cesarean delivery 12/20/2015  . S/P cesarean section 12/17/2015  . Nausea and vomiting of pregnancy, antepartum 11/09/2015  .  Gestational hypertension 11/09/2015  . Indication for care in labor and delivery, antepartum 11/09/2015  . Round ligament pain 10/21/2015  . Anxiety attack 07/07/2014    Past Surgical History:  Procedure Laterality Date  . APPENDECTOMY     Dec 2007  . CESAREAN SECTION     May 2008, breech  . CESAREAN SECTION WITH BILATERAL TUBAL LIGATION N/A 12/17/2015   Procedure: CESAREAN SECTION WITH BILATERAL TUBAL LIGATION;  Surgeon: Vena Austria, MD;  Location: [facility] ORS;  Service: Obstetrics;  Laterality: N/A;     OB History    Gravida  4   Para  4   Term  4   Preterm      AB      Living  4     SAB      TAB      Ectopic      Multiple  0   Live Births  1           Family History  Problem Relation Age of Onset  . Cancer Maternal Aunt     Social History   Tobacco Use  . Smoking status: Former Smoker    Packs/day: 0.50    Years: 3.00    Pack years: 1.50    Types: Cigarettes    Quit date: 02/26/2005    Years since quitting: 14.2  . Smokeless tobacco: Never Used  Substance Use Topics  . Alcohol use: No  . Drug use:  No    Home Medications Prior to Admission medications   Medication Sig Start Date End Date Taking? Authorizing Provider  atenolol (TENORMIN) 25 MG tablet Take 25 mg by mouth 2 (two) times daily.    Yes [provider]  methocarbamol (ROBAXIN) 500 MG tablet Take 500 mg by mouth daily as needed for muscle spasms (shoulder).   Yes [provider]  ibuprofen (ADVIL) 800 MG tablet Take 1 tablet (800 mg total) by mouth every 8 (eight) hours as needed for moderate pain or cramping. 05/07/19   Edu On, Ambrose Finland, MD    Allergies    Penicillins  Review of Systems   Review of Systems  Gastrointestinal: Positive for abdominal pain.   All other systems reviewed and are negative except that which was mentioned in HPI  Physical Exam Updated Vital Signs BP (!) 153/83   Pulse (!) 59   Temp 98.2 F (36.8 C) (Oral)   Resp 16    LMP 05/07/2019 (Approximate)   SpO2 98%   Physical Exam Vitals and nursing note reviewed. Exam conducted with a chaperone present.  Constitutional:      General: She is not in acute distress.    Appearance: She is well-developed. She is obese.  HENT:     Head: Normocephalic and atraumatic.  Eyes:     Conjunctiva/sclera: Conjunctivae normal.  Cardiovascular:     Rate and Rhythm: Normal rate and regular rhythm.     Heart sounds: Normal heart sounds. No murmur.  Pulmonary:     Effort: Pulmonary effort is normal.     Breath sounds: Normal breath sounds.  Abdominal:     General: Bowel sounds are normal. There is no distension.     Palpations: Abdomen is soft.     Tenderness: There is no abdominal tenderness.  Genitourinary:    Cervix: No lesion or cervical bleeding.     Adnexa: Right adnexa normal and left adnexa normal.     Comments: Scant white discharge in vaginal vault, no cervical lesions; mild tenderness over uterus on bimanual exam Musculoskeletal:     Cervical back: Neck supple.  Skin:    General: Skin is warm and dry.  Neurological:     Mental Status: She is alert and oriented to person, place, and time.     Comments: Fluent speech  Psychiatric:        Judgment: Judgment normal.     ED Results / Procedures / Treatments   Labs (all labs ordered are listed, but only abnormal results are displayed) Labs Reviewed  COMPREHENSIVE METABOLIC PANEL - Abnormal; Notable for the following components:      Result Value   Glucose, Bld 116 (*)    Creatinine, Ser 1.05 (*)    Calcium 8.8 (*)    AST 43 (*)    Total Bilirubin 1.6 (*)    All other components within normal limits  CBC - Abnormal; Notable for the following components:   WBC 10.7 (*)    RBC 5.25 (*)    Hemoglobin 11.4 (*)    MCV 70.1 (*)    MCH 21.7 (*)    RDW 18.5 (*)    All other components within normal limits  WET PREP, GENITAL  LIPASE, BLOOD  URINALYSIS, ROUTINE W REFLEX MICROSCOPIC  I-STAT BETA HCG  BLOOD, ED ([facility], [facility], [facility] ONLY)  GC/CHLAMYDIA PROBE AMP ([place]) NOT AT [facility]    EKG None  Radiology No results found.  Procedures Procedures (including critical care time)  Medications Ordered in ED Medications  sodium chloride flush (NS) 0.9 % injection 3 mL (has no administration in time range)    ED Course  I have reviewed the triage vital signs and the nursing notes.  Pertinent labs that were available during my care of the patient were reviewed by me and considered in my medical decision making (see chart for details).    MDM Rules/Calculators/A&P                      Well-appearing on exam. Pelvic exam without significant abnormality, no evidence of PID and no significant unilateral pain to suggest ovarian torsion or other ovarian pathology. Pregnancy test negative, screening lab work including CMP, CBC, and lipase is overall reassuring. She has chronic mild anemia which is likely related to heavy menstrual periods. UA from 2 days ago completely normal. Wet prep reassuring today. GC/chlamydia sent.  Her abd pain seems to directly correlate w/ menstrual cycles, ddx includes uterine fibroids or endometriosis. Strongly doubt acute process such as ovarian torsion, appendicitis, or diverticulitis. I have recommended that she contact OB/GYN for an appointment so that she can be further worked up and offered treatment options. Recommended ibuprofen as needed. She voiced understanding of plan. Final Clinical Impression(s) / ED Diagnoses Final diagnoses:  Lower abdominal pain  Dysmenorrhea    Rx / DC Orders ED Discharge Orders         Ordered    ibuprofen (ADVIL) 800 MG tablet  Every 8 hours PRN     05/07/19 1230           Moraima Burd, Wenda Overland, MD 05/07/19 1230

## 2019-05-08 LAB — GC/CHLAMYDIA PROBE AMP (~~LOC~~) NOT AT ARMC
Chlamydia: NEGATIVE
Neisseria Gonorrhea: NEGATIVE

## 2019-05-09 ENCOUNTER — Encounter: Payer: Self-pay | Admitting: Obstetrics and Gynecology

## 2019-06-20 ENCOUNTER — Other Ambulatory Visit (HOSPITAL_COMMUNITY)
Admission: RE | Admit: 2019-06-20 | Discharge: 2019-06-20 | Disposition: A | Discharge status: Home / Self Care | Payer: Self-pay | Source: Ambulatory Visit | Attending: Obstetrics and Gynecology | Admitting: Obstetrics and Gynecology

## 2019-06-20 ENCOUNTER — Other Ambulatory Visit: Payer: Self-pay

## 2019-06-20 ENCOUNTER — Ambulatory Visit (INDEPENDENT_AMBULATORY_CARE_PROVIDER_SITE_OTHER): Payer: Self-pay | Admitting: Obstetrics and Gynecology

## 2019-06-20 ENCOUNTER — Encounter: Payer: Self-pay | Admitting: Obstetrics and Gynecology

## 2019-06-20 VITALS — BP 146/96 | HR 79 | Ht 68.0 in | Wt 302.8 lb

## 2019-06-20 DIAGNOSIS — Z6841 Body Mass Index (BMI) 40.0 and over, adult: Secondary | ICD-10-CM

## 2019-06-20 DIAGNOSIS — R7989 Other specified abnormal findings of blood chemistry: Secondary | ICD-10-CM

## 2019-06-20 DIAGNOSIS — R102 Pelvic and perineal pain: Secondary | ICD-10-CM

## 2019-06-20 DIAGNOSIS — R7401 Elevation of levels of liver transaminase levels: Secondary | ICD-10-CM

## 2019-06-20 DIAGNOSIS — Z8742 Personal history of other diseases of the female genital tract: Secondary | ICD-10-CM

## 2019-06-20 NOTE — Progress Notes (Addendum)
Obstetrics and Gynecology New Patient Evaluation  Appointment Date: 06/20/2019  OBGYN Clinic: [facility] for [facility]  Primary Care Provider: None since moving to [facility] from [place]  Referring Provider: [facility]  Chief Complaint: pelvic pain, heavy and painful periods  History of Present Illness: Yvette Wilson is a 39 y.o. African-American [phone] (Patient's last menstrual period was 05/16/2019.), seen for the above chief complaint. Her past medical history is significant for BMI 40s, HTN, h/o c-section x 2 and BTL.  She was seen in the ED in late March for this and had negative STD swabs (negative) but no u/s.   Pelvic pain: for the past 3-4 months. She has pain that is intense around the time of her periods and sometimes off and on outside of her period. Sometimes her pains are so intense she does have to miss work.   AUB: sometimes she'll rarely have pain like a period but no bleeding for that month, but her periods are usually qmonth, 1 week and regular. This started after her last c/s with BTL   Review of Systems: Pertinent items noted in HPI and remainder of comprehensive ROS otherwise negative.   Past Medical History:  Past Medical History:  Diagnosis Date  . Gestational diabetes    Gestational, diet treated  . History of pre-eclampsia 1999   birth of first child  . Hypertension    with all pregnancies, and then continued after last pregnancy  . Sickle cell trait [facility])     Past Surgical History:  Past Surgical History:  Procedure Laterality Date  . APPENDECTOMY     Dec 2007  . CESAREAN SECTION     May 2008, breech  . CESAREAN SECTION WITH BILATERAL TUBAL LIGATION N/A 12/17/2015   Procedure: CESAREAN SECTION WITH BILATERAL TUBAL LIGATION;  Surgeon: Malachy Mood, MD;  Location: [facility] ORS;  Service: Obstetrics;  Laterality: N/A;    Past Obstetrical History:  OB History  Gravida Para Term Preterm AB Living  4 4 4     4   SAB TAB  Ectopic Multiple Live Births        0 1    # Outcome Date GA Lbr Len/2nd Weight Sex Delivery Anes PTL Lv  4 Term 12/17/15 [redacted]w[redacted]d  6 lb 15.1 oz (3.15 kg) M CS-LTranv Spinal  LIV  3 Term           2 Term           1 Term             Obstetric Comments  SVD x 2 and then c/s x 2 with BTL with last c/s    Past Gynecological History: As per HPI. Menarche age  Periods: as per HPI History of Pap Smear(s): unknown  Social History:  Social History   Socioeconomic History  . Marital status: Single    Spouse name: Not on file  . Number of children: Not on file  . Years of education: Not on file  . Highest education level: Not on file  Occupational History  . Not on file  Tobacco Use  . Smoking status: Former Smoker    Packs/day: 0.50    Years: 3.00    Pack years: 1.50    Types: Cigarettes    Quit date: 02/26/2005    Years since quitting: 14.3  . Smokeless tobacco: Never Used  Substance and Sexual Activity  . Alcohol use: No  . Drug use: No  . Sexual activity: Yes  Birth control/protection: None  Other Topics Concern  . Not on file  Social History Narrative  . Not on file   Social Determinants of Health   Financial Resource Strain:   . Difficulty of Paying Living Expenses:   Food Insecurity:   . Worried About Programme researcher, broadcasting/film/video in the Last Year:   . Barista in the Last Year:   Transportation Needs:   . Freight forwarder (Medical):   Marland Kitchen Lack of Transportation (Non-Medical):   Physical Activity:   . Days of Exercise per Week:   . Minutes of Exercise per Session:   Stress:   . Feeling of Stress :   Social Connections:   . Frequency of Communication with Friends and Family:   . Frequency of Social Gatherings with Friends and Family:   . Attends Religious Services:   . Active Member of Clubs or Organizations:   . Attends Banker Meetings:   Marland Kitchen Marital Status:   Intimate Partner Violence:   . Fear of Current or Ex-Partner:   . Emotionally  Abused:   Marland Kitchen Physically Abused:   . Sexually Abused:     Family History:  Family History  Problem Relation Age of Onset  . Cancer Maternal Aunt     Medications [name]. [name] had no medications administered during this visit. Current Outpatient Medications  Medication Sig Dispense Refill  . atenolol (TENORMIN) 25 MG tablet Take 25 mg by mouth 2 (two) times daily.     Marland Kitchen ibuprofen (ADVIL) 800 MG tablet Take 1 tablet (800 mg total) by mouth every 8 (eight) hours as needed for moderate pain or cramping. 15 tablet 0   No current facility-administered medications for this visit.    Allergies Penicillins   Physical Exam:  BP (!) 146/96   Pulse 79   Ht 5\' 8"  (1.727 m)   Wt (!) 302 lb 12.8 oz (137.3 kg)   LMP 05/16/2019   BMI 46.04 kg/m  Body mass index is 46.04 kg/m. General appearance: Well nourished, well developed female in no acute distress.  Cardiovascular: normal s1 and s2.  No murmurs, rubs or gallops. Respiratory:  Clear to auscultation bilateral. Normal respiratory effort Abdomen: positive bowel sounds and no masses, hernias; diffusely non tender to palpation, non distended Neuro/Psych:  Normal mood and affect.  Skin:  Warm and dry.  Lymphatic:  No inguinal lymphadenopathy.   Pelvic exam: is limited by body habitus EGBUS: within normal limits Vagina: within normal limits and with no blood or discharge in the vault Cervix: normal appearing cervix without tenderness, discharge or lesions.  Uterus:  nonenlarged and non tender Adnexa:  normal adnexa and no mass, fullness, tenderness Rectovaginal: deferred  Laboratory: reviewed. UPT today negative  Radiology: none  Assessment: pt stabld  Plan:  1. Elevated serum creatinine - Comprehensive metabolic panel  2. Elevated AST (SGOT) - Comprehensive metabolic panel  3. Pelvic pain Needs basic work up and d/w pt after results are back - Cytology - PAP( [place]) - 07/16/2019 PELVIC COMPLETE WITH TRANSVAGINAL;  Future - Urine Culture  4. History of abnormal uterine bleeding - Hemoglobin A1c - TSH  5. BMI 40.0-44.9, adult (HCC)  RTC after u/s  11-27-1981 MD Attending Center for [facility])

## 2019-06-21 LAB — COMPREHENSIVE METABOLIC PANEL
ALT: 11 IU/L (ref 0–32)
AST: 11 IU/L (ref 0–40)
Albumin/Globulin Ratio: 1.5 (ref 1.2–2.2)
Albumin: 4.6 g/dL (ref 3.8–4.8)
Alkaline Phosphatase: 70 IU/L (ref 39–117)
BUN/Creatinine Ratio: 12 (ref 9–23)
BUN: 11 mg/dL (ref 6–20)
Bilirubin Total: 0.2 mg/dL (ref 0.0–1.2)
CO2: 21 mmol/L (ref 20–29)
Calcium: 9.4 mg/dL (ref 8.7–10.2)
Chloride: 104 mmol/L (ref 96–106)
Creatinine, Ser: 0.91 mg/dL (ref 0.57–1.00)
GFR calc Af Amer: 93 mL/min/{1.73_m2} (ref >59)
GFR calc non Af Amer: 80 mL/min/{1.73_m2} (ref >59)
Globulin, Total: 3.1 g/dL (ref 1.5–4.5)
Glucose: 96 mg/dL (ref 65–99)
Potassium: 4.3 mmol/L (ref 3.5–5.2)
Sodium: 145 mmol/L — ABNORMAL HIGH (ref 134–144)
Total Protein: 7.7 g/dL (ref 6.0–8.5)

## 2019-06-21 LAB — HEMOGLOBIN A1C
Est. average glucose Bld gHb Est-mCnc: 126 mg/dL
Hgb A1c MFr Bld: 6 % — ABNORMAL HIGH (ref 4.8–5.6)

## 2019-06-21 LAB — URINE CULTURE

## 2019-06-21 LAB — TSH: TSH: 0.603 u[IU]/mL (ref 0.450–4.500)

## 2019-06-24 LAB — CYTOLOGY - PAP
Comment: NEGATIVE
Diagnosis: NEGATIVE
High risk HPV: NEGATIVE

## 2019-06-25 ENCOUNTER — Encounter: Payer: Self-pay | Admitting: Obstetrics and Gynecology

## 2019-06-25 DIAGNOSIS — R7303 Prediabetes: Secondary | ICD-10-CM | POA: Insufficient documentation

## 2019-06-27 ENCOUNTER — Ambulatory Visit
Admission: RE | Admit: 2019-06-27 | Discharge: 2019-06-27 | Disposition: A | Discharge status: Home / Self Care | Payer: Self-pay | Source: Ambulatory Visit | Attending: Obstetrics and Gynecology | Admitting: Obstetrics and Gynecology

## 2019-06-27 ENCOUNTER — Other Ambulatory Visit: Payer: Self-pay

## 2019-06-27 DIAGNOSIS — R102 Pelvic and perineal pain: Secondary | ICD-10-CM | POA: Insufficient documentation

## 2019-07-05 ENCOUNTER — Other Ambulatory Visit: Payer: Self-pay | Admitting: Obstetrics and Gynecology

## 2019-07-05 MED ORDER — MEDROXYPROGESTERONE ACETATE 10 MG PO TABS: 10.0000 mg | ORAL_TABLET | Freq: Every day | ORAL | 1 refills | Status: DC

## 2019-07-26 ENCOUNTER — Encounter: Payer: Self-pay | Admitting: Obstetrics and Gynecology

## 2019-07-26 ENCOUNTER — Ambulatory Visit (INDEPENDENT_AMBULATORY_CARE_PROVIDER_SITE_OTHER): Payer: Self-pay | Admitting: Obstetrics and Gynecology

## 2019-07-26 ENCOUNTER — Other Ambulatory Visit: Payer: Self-pay

## 2019-07-26 VITALS — BP 139/108 | HR 73 | Temp 98.7°F | Ht 67.0 in | Wt 303.7 lb

## 2019-07-26 DIAGNOSIS — N946 Dysmenorrhea, unspecified: Secondary | ICD-10-CM

## 2019-07-26 DIAGNOSIS — G8929 Other chronic pain: Secondary | ICD-10-CM

## 2019-07-26 DIAGNOSIS — R102 Pelvic and perineal pain: Secondary | ICD-10-CM

## 2019-07-26 DIAGNOSIS — I10 Essential (primary) hypertension: Secondary | ICD-10-CM

## 2019-07-26 MED ORDER — NORETHINDRONE 0.35 MG PO TABS: 1.0000 | ORAL_TABLET | Freq: Every day | ORAL | 3 refills | Status: DC

## 2019-07-26 NOTE — Progress Notes (Signed)
Pt stated she has continued to have abdominal pain and had to quit her new job. She did not pick up the progesterone which had been prescribed.

## 2019-07-26 NOTE — Progress Notes (Signed)
Obstetrics and Gynecology Visit Return Patient Evaluation  Appointment Date: 07/26/2019  Primary Care Provider: None  OBGYN Clinic: [facility]  Chief Complaint: follow up pelvic pain, periods  History of Present Illness:  Yvette Wilson is a 39 y.o. with above CC. Patient seen by me for new patient eval on 5/6 as a referral from the [facility] ED. She had a negative pap, tsh, cmp, cbc, ucx. She had an a1c that showed pre-diabetes and she had a negative gc/ct and wet prep. An u/s was ordered after her last visit and that was negative.  Pelvic pain continues and she's had to miss work since then, no AUB. She never picked up the prn provera from last visit.   Patient is self pay.   Review of Systems: as noted in the History of Present Illness.  Medications:  [name]. [place] had no medications administered during this visit. Current Outpatient Medications  Medication Sig Dispense Refill  . atenolol (TENORMIN) 25 MG tablet Take 25 mg by mouth 2 (two) times daily.     Marland Kitchen ibuprofen (ADVIL) 800 MG tablet Take 1 tablet (800 mg total) by mouth every 8 (eight) hours as needed for moderate pain or cramping. (Patient not taking: Reported on 07/26/2019) 15 tablet 0   No current facility-administered medications for this visit.    Allergies: is allergic to penicillins.  Physical Exam:  BP (!) 139/108   Pulse 73   Temp 98.7 F (37.1 C)   Ht 5\' 7"  (1.702 m)   Wt (!) 303 lb 11.2 oz (137.8 kg)   LMP 06/29/2019 (Exact Date)   BMI 47.57 kg/m  Body mass index is 47.57 kg/m. General appearance: Well nourished, well developed female in no acute distress.  Abdomen: obese, nttp, nd Neuro/Psych:  Normal mood and affect.    Labs:  As per HPI  Radiology CLINICAL DATA:  Pelvic pain, history of tubal ligation, Caesarean section, G4P4; LMP 05/16/2019 with negative urine pregnancy test on 06/22/2019  EXAM: TRANSABDOMINAL AND TRANSVAGINAL ULTRASOUND OF  PELVIS  TECHNIQUE: Both transabdominal and transvaginal ultrasound examinations of the pelvis were performed. Transabdominal technique was performed for global imaging of the pelvis including uterus, ovaries, adnexal regions, and pelvic cul-de-sac. It was necessary to proceed with endovaginal exam following the transabdominal exam to visualize the lower uterine segment and ovaries.  COMPARISON:  06/15/2004  FINDINGS: Uterus  Measurements: 10.1 x 5.1 x 6.5 cm = volume: 175 mL. Anteverted. Heterogeneous myometrium. No focal mass.  Endometrium  Thickness: 15 mm.  No endometrial fluid or focal abnormality  Right ovary  Measurements: 3.3 x 1.8 x 3.0 cm = volume: 9.1 mL. Normal morphology without mass  Left ovary  Measurements: 4.1 x 2.5 x 1.7 cm = volume: 10.4 mL. Normal morphology without mass  Other findings  Trace free pelvic fluid.  No adnexal masses.  IMPRESSION: Heterogeneous myometrium without definite focal mass lesion.  Otherwise negative exam.   Electronically Signed   By: 08/15/2004 M.D.   On: 06/27/2019 12:57  Assessment: pt stable  Plan:  1. Hypertension, unspecified type Will give information to establish PCP with [facility]. She states she got my in basket message re: her pre-diabetes based on a1c. Importance of establishing a PCP d/w her, especially in the context of if we are moving towards surgery in the future, she will need to have an established PCP given her co-morbidities.  2. Chronic pelvic pain in female I d/w her no obvious  etiology for her chronic pelvic pain, pain with periods. I told her medical options would be depo provera, OCPs or Mirena IUD and also d/w her re: surgical options, and I recommended to start with medical option. Although a Mirena would be preferred, we can start with POPs. Pt is self pay so I told her that we can start with progestin only pills and RTC in 2-47m. Pt has financial  assistance application with [facility] and will start working on that  3. Dysmenorrhea   RTC: 3 months  Durene Romans MD Attending Center for [facility])

## 2019-10-29 ENCOUNTER — Encounter (HOSPITAL_COMMUNITY): Payer: Self-pay | Admitting: Emergency Medicine

## 2019-10-29 ENCOUNTER — Other Ambulatory Visit: Payer: Self-pay

## 2019-10-29 ENCOUNTER — Emergency Department (HOSPITAL_COMMUNITY)
Admission: EM | Admit: 2019-10-29 | Discharge: 2019-10-29 | Disposition: A | Discharge status: Home / Self Care | Payer: Self-pay | Attending: Emergency Medicine | Admitting: Emergency Medicine

## 2019-10-29 ENCOUNTER — Emergency Department (HOSPITAL_COMMUNITY): Payer: Self-pay

## 2019-10-29 DIAGNOSIS — Z79899 Other long term (current) drug therapy: Secondary | ICD-10-CM | POA: Insufficient documentation

## 2019-10-29 DIAGNOSIS — Z87891 Personal history of nicotine dependence: Secondary | ICD-10-CM | POA: Insufficient documentation

## 2019-10-29 DIAGNOSIS — I10 Essential (primary) hypertension: Secondary | ICD-10-CM | POA: Insufficient documentation

## 2019-10-29 DIAGNOSIS — M25562 Pain in left knee: Secondary | ICD-10-CM | POA: Insufficient documentation

## 2019-10-29 DIAGNOSIS — R200 Anesthesia of skin: Secondary | ICD-10-CM | POA: Insufficient documentation

## 2019-10-29 MED ORDER — OXYCODONE-ACETAMINOPHEN 5-325 MG PO TABS
1.0000 | ORAL_TABLET | ORAL | Status: DC | PRN
  Administered 2019-10-29: 1 via ORAL
  Filled 2019-10-29: qty 1

## 2019-10-29 MED ORDER — ONDANSETRON 4 MG PO TBDP
4.0000 mg | ORAL_TABLET | Freq: Once | ORAL | Status: AC
  Administered 2019-10-29: 4 mg via ORAL
  Filled 2019-10-29: qty 1

## 2019-10-29 NOTE — ED Triage Notes (Signed)
Onset one day ago tripped on a lego piece and fell "Did the splits" Pain behind left thigh radiating to left buttocks and left knee.

## 2019-10-29 NOTE — ED Notes (Signed)
Patient verbalizes understanding of discharge instructions. Opportunity for questioning and answers were provided. Armband removed by staff, pt discharged from ED. Pt. ambulatory and discharged home.  

## 2019-10-29 NOTE — Progress Notes (Signed)
Orthopedic Tech Progress Note Patient Details:  Yvette Wilson April 08, 1980 [id number]  Ortho Devices Type of Ortho Device: Crutches, Knee Immobilizer Ortho Device/Splint Location: LLE Ortho Device/Splint Interventions: Ordered, Application, Adjustment   Post Interventions Patient Tolerated: Well Instructions Provided: Care of device, Adjustment of device, Poper ambulation with device   Yvette Wilson 10/29/2019, 3:00 PM

## 2019-10-29 NOTE — ED Notes (Signed)
Ortho paged. 

## 2019-10-29 NOTE — Discharge Instructions (Signed)
1. Medications: Alternate 600 mg of ibuprofen and [phone] mg of Tylenol every 3 hours as needed for pain. Do not exceed 4000 mg of Tylenol daily.  Take ibuprofen with food to avoid upset stomach issues.  You can take your muscle relaxant at night to help you get to sleep.  Be aware this medicine can cause drowsiness so do not drive, drink alcohol or operate heavy machinery while taking this medication. 2. Treatment: rest, ice, elevate and use knee immobilizer and crutches, drink plenty of fluids. 3. Follow Up: Please followup with orthopedics as directed or your for discussion of your diagnoses and further evaluation after today's visit; [facility] also has an Ortho urgent care; call  [phone] for more information.  Please return to the ER for worsening symptoms or other concerns such as worsening swelling, redness of the skin, fevers, loss of pulses, or loss of feeling

## 2019-10-29 NOTE — ED Provider Notes (Signed)
[facility] EMERGENCY DEPARTMENT Provider Note   CSN: [id number] Arrival date & time: 10/29/19  2878     History Chief Complaint  Patient presents with  . Leg Pain    Yvette Wilson is a 39 y.o. female with history of gestational diabetes, hypertension, sickle cell trait presents for evaluation of acute onset, persistent posterior left knee pain secondary to injury yesterday around 7 PM.  She reports that she tripped on one of her son's toys and an attempt did not fall and hit her head she hyperextended her left lower extremity at the knee landing "in a split".  She states that she heard a "pop like bubble wrap" and since then has had pain mostly along the posterior aspect of the left knee radiating up the posterior aspect of the left thigh.  At times feels numbness and tingling of her left foot.  Pain worsens with flexion and extension but she will get some relief when she keeps the knee in a flexed position.  Has taken Robaxin which helped her sleep and also applying ice and massaging with little relief.  Denies fevers.  She is able to ambulate but has pain with bearing weight.  The history is provided by the patient.       Past Medical History:  Diagnosis Date  . Gestational diabetes    Gestational, diet treated  . Gestational hypertension 11/09/2015  . History of pre-eclampsia 1999   birth of first child  . Hypertension    with all pregnancies, and then continued after last pregnancy  . Sickle cell trait [facility])     Patient Active Problem List   Diagnosis Date Noted  . Prediabetes 06/25/2019  . Anxiety attack 07/07/2014    Past Surgical History:  Procedure Laterality Date  . APPENDECTOMY     Dec 2007  . CESAREAN SECTION     May 2008, breech  . CESAREAN SECTION WITH BILATERAL TUBAL LIGATION N/A 12/17/2015   Procedure: CESAREAN SECTION WITH BILATERAL TUBAL LIGATION;  Surgeon: Vena Austria, MD;  Location: [facility] ORS;  Service: Obstetrics;  Laterality:  N/A;     OB History    Gravida  4   Para  4   Term  4   Preterm      AB      Living  4     SAB      TAB      Ectopic      Multiple  0   Live Births  1        Obstetric Comments  SVD x 2 and then c/s x 2 with BTL with last c/s        Family History  Problem Relation Age of Onset  . Cancer Maternal Aunt     Social History   Tobacco Use  . Smoking status: Former Smoker    Packs/day: 0.50    Years: 3.00    Pack years: 1.50    Types: Cigarettes    Quit date: 02/26/2005    Years since quitting: 14.6  . Smokeless tobacco: Never Used  Substance Use Topics  . Alcohol use: No  . Drug use: No    Home Medications Prior to Admission medications   Medication Sig Start Date End Date Taking? Authorizing Provider  atenolol (TENORMIN) 25 MG tablet Take 25 mg by mouth 2 (two) times daily.     [provider]  ibuprofen (ADVIL) 800 MG tablet Take 1 tablet (800 mg total) by  mouth every 8 (eight) hours as needed for moderate pain or cramping. Patient not taking: Reported on 07/26/2019 05/07/19   Little, Ambrose Finland, MD  medroxyPROGESTERone (PROVERA) 10 MG tablet Take 1 tablet (10 mg total) by mouth daily. 1 tab po 3x/day x 3 days and then 1 tab po qday Patient not taking: Reported on 07/26/2019 07/05/19   [place] Bing, MD  norethindrone (MICRONOR) 0.35 MG tablet Take 1 tablet (0.35 mg total) by mouth daily. 07/26/19    Bing, MD    Allergies    Penicillins  Review of Systems   Review of Systems  Constitutional: Negative for chills and fever.  Musculoskeletal: Positive for arthralgias.  Neurological: Positive for numbness. Negative for weakness.    Physical Exam Updated Vital Signs BP (!) 159/97   Pulse 65   Temp 98.2 F (36.8 C) (Oral)   Resp 15   Ht 5\' 8"  (1.727 m)   Wt 136.1 kg   SpO2 100%   BMI 45.61 kg/m   Physical Exam Vitals and nursing note reviewed.  Constitutional:      General: She is not in acute distress.     Appearance: She is well-developed. She is obese.  HENT:     Head: Normocephalic and atraumatic.  Eyes:     General:        Right eye: No discharge.        Left eye: No discharge.     Conjunctiva/sclera: Conjunctivae normal.  Neck:     Vascular: No JVD.     Trachea: No tracheal deviation.  Cardiovascular:     Rate and Rhythm: Normal rate.     Pulses: Normal pulses.     Comments: 2+ DP/PT pulses bilaterally, Homans' sign absent bilaterally.  No lower extremity edema. Pulmonary:     Effort: Pulmonary effort is normal.  Abdominal:     General: There is no distension.  Musculoskeletal:        General: Tenderness present.     Comments: There is tenderness to palpation along the distal posterior left thigh in the hamstring distribution.  No swelling.  No tenderness to palpation of the anterior structures of the knee.  No ligamentous laxity or varus or valgus instability.  She is able to actively flex and extend the knee with some limitation due to pain.  5/5 strength of BLE major muscle groups.  Skin:    General: Skin is warm and dry.     Findings: No erythema.  Neurological:     Mental Status: She is alert.     Comments: Sensation intact to light touch of bilateral lower extremities.  Ambulatory with antalgic gait but exhibits good balance.  Psychiatric:        Behavior: Behavior normal.     ED Results / Procedures / Treatments   Labs (all labs ordered are listed, but only abnormal results are displayed) Labs Reviewed - No data to display  EKG None  Radiology DG Knee Complete 4 Views Left  Result Date: 10/29/2019 CLINICAL DATA:  10/31/2019. Persistent knee pain. EXAM: LEFT KNEE - COMPLETE 4+ VIEW COMPARISON:  None. FINDINGS: The joint spaces are maintained. No acute fracture is identified. No degenerative changes, chondrocalcinosis or osteochondral lesion. No joint effusion. IMPRESSION: No acute bony findings or joint effusion. Electronically Signed   By: Larey Seat M.D.   On:  10/29/2019 10:15    Procedures Procedures (including critical care time)  Medications Ordered in ED Medications  oxyCODONE-acetaminophen (PERCOCET/ROXICET) 5-325 MG per tablet 1  tablet (1 tablet Oral Given 10/29/19 0958)  ondansetron (ZOFRAN-ODT) disintegrating tablet 4 mg (4 mg Oral Given 10/29/19 0086)    ED Course  I have reviewed the triage vital signs and the nursing notes.  Pertinent labs & imaging results that were available during my care of the patient were reviewed by me and considered in my medical decision making (see chart for details).    MDM Rules/Calculators/A&P                          Patient with left knee pain secondary to injury yesterday evening.  She is afebrile, vital signs stable.  She is nontoxic in appearance but she is neurovascularly intact.  Compartments are soft.  Radiographs show no evidence of acute osseous abnormality.  No evidence of effusion.No concern for DVT, septic arthritis, or secondary skin infection.  Physical examination concerning for possible hamstring pathology though doubt complete tear as she is able to flex and extend the knee actively.  Will place in knee immobilizer and give crutches.  Discussed conservative therapy and management with rest, ice, compression, elevation, NSAIDs, Tylenol.  Discussed that her muscle relaxant can cause drowsiness.  Recommend close follow-up with orthopedics on an outpatient basis.  Discussed ED return precautions.  Patient verbalized understanding of and agreement with plan and patient is stable for discharge at this time. Final Clinical Impression(s) / ED Diagnoses Final diagnoses:  Acute pain of left knee    Rx / DC Orders ED Discharge Orders    None       Jeanie Sewer, PA-C 10/29/19 1349    Melene Plan, DO 10/29/19 1450

## 2020-05-12 ENCOUNTER — Encounter (HOSPITAL_COMMUNITY): Payer: Self-pay

## 2020-05-12 ENCOUNTER — Other Ambulatory Visit: Payer: Self-pay

## 2020-05-12 ENCOUNTER — Emergency Department (HOSPITAL_COMMUNITY)
Admission: EM | Admit: 2020-05-12 | Discharge: 2020-05-12 | Disposition: A | Discharge status: Home / Self Care | Payer: Medicaid Other | Attending: Emergency Medicine | Admitting: Emergency Medicine

## 2020-05-12 ENCOUNTER — Emergency Department (HOSPITAL_COMMUNITY): Payer: Medicaid Other

## 2020-05-12 DIAGNOSIS — Z87891 Personal history of nicotine dependence: Secondary | ICD-10-CM | POA: Insufficient documentation

## 2020-05-12 DIAGNOSIS — I10 Essential (primary) hypertension: Secondary | ICD-10-CM | POA: Insufficient documentation

## 2020-05-12 DIAGNOSIS — I160 Hypertensive urgency: Secondary | ICD-10-CM

## 2020-05-12 DIAGNOSIS — M25561 Pain in right knee: Secondary | ICD-10-CM | POA: Insufficient documentation

## 2020-05-12 DIAGNOSIS — Z79899 Other long term (current) drug therapy: Secondary | ICD-10-CM | POA: Insufficient documentation

## 2020-05-12 DIAGNOSIS — W500XXA Accidental hit or strike by another person, initial encounter: Secondary | ICD-10-CM | POA: Insufficient documentation

## 2020-05-12 MED ORDER — ATENOLOL 25 MG PO TABS: 25.0000 mg | ORAL_TABLET | Freq: Two times a day (BID) | ORAL | 1 refills | Status: DC

## 2020-05-12 MED ORDER — ATENOLOL 25 MG PO TABS
25.0000 mg | ORAL_TABLET | Freq: Once | ORAL | Status: AC
  Administered 2020-05-12: 25 mg via ORAL
  Filled 2020-05-12: qty 1

## 2020-05-12 MED ORDER — ACETAMINOPHEN 500 MG PO TABS
1000.0000 mg | ORAL_TABLET | Freq: Once | ORAL | Status: AC
  Administered 2020-05-12: 1000 mg via ORAL
  Filled 2020-05-12: qty 2

## 2020-05-12 MED ORDER — KETOROLAC TROMETHAMINE 30 MG/ML IJ SOLN
30.0000 mg | Freq: Once | INTRAMUSCULAR | Status: AC
  Administered 2020-05-12: 30 mg via INTRAMUSCULAR
  Filled 2020-05-12: qty 1

## 2020-05-12 NOTE — ED Triage Notes (Signed)
Patient complains of right knee pain for 3 days. Denies injury, pain with ROM. NAD

## 2020-05-12 NOTE — Progress Notes (Signed)
Orthopedic Tech Progress Note Patient Details:  Yvette Wilson 1980/04/18 [id number]  Ortho Devices Type of Ortho Device: Knee Sleeve Ortho Device/Splint Location: RLE Ortho Device/Splint Interventions: Ordered,Application,Adjustment   Post Interventions Patient Tolerated: Well Instructions Provided: Care of device,Adjustment of device,Poper ambulation with device   [name] 05/12/2020, 9:30 AM

## 2020-05-12 NOTE — Discharge Instructions (Signed)
If you develop fever, vomiting, new or worsening pain, swelling to your knee, or any other new/concerning symptoms, then return to the ER for evaluation

## 2020-05-12 NOTE — ED Provider Notes (Signed)
[facility] EMERGENCY DEPARTMENT Provider Note   CSN: [id number] Arrival date & time: 05/12/20  2633     History No chief complaint on file.   Yvette Wilson is a 40 y.o. female.  HPI 40 year old female presents with right knee pain.  It has been hurting her anteriorly for about 3 weeks.  It is worse over the last 3 days.  Last night her son fell on her knee which made it hurt even worse.  Sometimes it will give out on her.  No swelling.  No systemic symptoms.  No weakness or numbness.  Has been trying ibuprofen without relief.  She is also noted to have elevated blood pressure here and states she did not take her atenolol this morning but otherwise has been taking it.  She denies headache, blurry vision, chest pain, or urinary symptoms. No popliteal pain/calf swelling.  Past Medical History:  Diagnosis Date  . Gestational diabetes    Gestational, diet treated  . Gestational hypertension 11/09/2015  . History of pre-eclampsia 1999   birth of first child  . Hypertension    with all pregnancies, and then continued after last pregnancy  . Sickle cell trait [facility])     Patient Active Problem List   Diagnosis Date Noted  . Prediabetes 06/25/2019  . Anxiety attack 07/07/2014    Past Surgical History:  Procedure Laterality Date  . APPENDECTOMY     Dec 2007  . CESAREAN SECTION     May 2008, breech  . CESAREAN SECTION WITH BILATERAL TUBAL LIGATION N/A 12/17/2015   Procedure: CESAREAN SECTION WITH BILATERAL TUBAL LIGATION;  Surgeon: Vena Austria, MD;  Location: [facility] ORS;  Service: Obstetrics;  Laterality: N/A;     OB History    Gravida  4   Para  4   Term  4   Preterm      AB      Living  4     SAB      IAB      Ectopic      Multiple  0   Live Births  1        Obstetric Comments  SVD x 2 and then c/s x 2 with BTL with last c/s        Family History  Problem Relation Age of Onset  . Cancer Maternal Aunt     Social History    Tobacco Use  . Smoking status: Former Smoker    Packs/day: 0.50    Years: 3.00    Pack years: 1.50    Types: Cigarettes    Quit date: 02/26/2005    Years since quitting: 15.2  . Smokeless tobacco: Never Used  Substance Use Topics  . Alcohol use: No  . Drug use: No    Home Medications Prior to Admission medications   Medication Sig Start Date End Date Taking? Authorizing Provider  atenolol (TENORMIN) 25 MG tablet Take 1 tablet (25 mg total) by mouth 2 (two) times daily. 05/12/20   Pricilla Loveless, MD  ibuprofen (ADVIL) 800 MG tablet Take 1 tablet (800 mg total) by mouth every 8 (eight) hours as needed for moderate pain or cramping. Patient not taking: Reported on 07/26/2019 05/07/19   Little, Ambrose [place], MD  medroxyPROGESTERone (PROVERA) 10 MG tablet Take 1 tablet (10 mg total) by mouth daily. 1 tab po 3x/day x 3 days and then 1 tab po qday Patient not taking: Reported on 07/26/2019 07/05/19   Chapman Bing, MD  norethindrone (MICRONOR) 0.35 MG tablet Take 1 tablet (0.35 mg total) by mouth daily. 07/26/19   Braddock Bing, MD    Allergies    Penicillins  Review of Systems   Review of Systems  Constitutional: Negative for fever.  Eyes: Negative for visual disturbance.  Cardiovascular: Negative for chest pain.  Musculoskeletal: Positive for arthralgias. Negative for joint swelling.  Neurological: Negative for weakness, numbness and headaches.  All other systems reviewed and are negative.   Physical Exam Updated Vital Signs BP (!) 186/109 (BP Location: Left Wrist)   Pulse (!) 50   Temp 97.9 F (36.6 C) (Oral)   Resp 15   LMP 04/29/2020   SpO2 100%   Physical Exam Vitals and nursing note reviewed.  Constitutional:      General: She is not in acute distress.    Appearance: She is well-developed. She is obese. She is not ill-appearing or diaphoretic.  HENT:     Head: Normocephalic and atraumatic.     Right Ear: External ear normal.     Left Ear: External ear  normal.     Nose: Nose normal.  Eyes:     General:        Right eye: No discharge.        Left eye: No discharge.  Cardiovascular:     Rate and Rhythm: Normal rate and regular rhythm.     Pulses:          Dorsalis pedis pulses are 2+ on the right side.  Pulmonary:     Effort: Pulmonary effort is normal.  Abdominal:     General: There is no distension.  Musculoskeletal:     Right knee: Decreased range of motion (is painful to flex, but is able to fully flex/extend). Tenderness present. Normal alignment.     Right lower leg: No swelling or tenderness.     Comments: No significant ligament laxity on right. No erythema/increased warmth  Skin:    General: Skin is warm and dry.  Neurological:     Mental Status: She is alert.  Psychiatric:        Mood and Affect: Mood is not anxious.     ED Results / Procedures / Treatments   Labs (all labs ordered are listed, but only abnormal results are displayed) Labs Reviewed - No data to display  EKG None  Radiology DG Knee Complete 4 Views Right  Result Date: 05/12/2020 CLINICAL DATA:  Pain for 3 weeks EXAM: RIGHT KNEE - COMPLETE 4+ VIEW COMPARISON:  None. FINDINGS: Frontal, lateral, and bilateral oblique views were obtained. No appreciable fracture or dislocation. No evident joint effusion. Joint spaces appear normal. No erosive change. IMPRESSION: No fracture, dislocation, or joint effusion. No appreciable arthropathic change. Electronically Signed   By: Bretta Bang III M.D.   On: 05/12/2020 07:55    Procedures Procedures   Medications Ordered in ED Medications  atenolol (TENORMIN) tablet 25 mg (25 mg Oral Given 05/12/20 0849)  ketorolac (TORADOL) 30 MG/ML injection 30 mg (30 mg Intramuscular Given 05/12/20 0851)  acetaminophen (TYLENOL) tablet 1,000 mg (1,000 mg Oral Given 05/12/20 8099)    ED Course  I have reviewed the triage vital signs and the nursing notes.  Pertinent labs & imaging results that were available during  my care of the patient were reviewed by me and considered in my medical decision making (see chart for details).    MDM Rules/Calculators/A&P  Patient's right knee is overall unremarkable besides some anterior and inferior tenderness.  No obvious joint effusion or erythema/warmth to be concerned about infection.  It is painful but she can ambulate and range her knee.  She is also noted to be quite hypertensive.  Part of this may be pain in part this may be not taking her meds this morning.  Blood pressure is already coming down and I have given her morning dose.  We discussed possibly changing out meds but for now we decided together to refill her atenolol and have her follow-up with a PCP.  She needs a new one as she had previously lost insurance.  She has no signs/symptoms of hypertensive emergency.  Appears stable for discharge home. Given a knee sleeve Final Clinical Impression(s) / ED Diagnoses Final diagnoses:  Acute pain of right knee  Hypertensive urgency    Rx / DC Orders ED Discharge Orders         Ordered    atenolol (TENORMIN) 25 MG tablet  2 times daily        05/12/20 0949           Pricilla Loveless, MD 05/12/20 1002

## 2020-06-26 ENCOUNTER — Other Ambulatory Visit: Payer: Self-pay

## 2020-06-26 ENCOUNTER — Emergency Department (HOSPITAL_COMMUNITY)
Admission: EM | Admit: 2020-06-26 | Discharge: 2020-06-26 | Disposition: A | Discharge status: Home / Self Care | Payer: Medicaid Other | Attending: Emergency Medicine | Admitting: Emergency Medicine

## 2020-06-26 ENCOUNTER — Encounter (HOSPITAL_COMMUNITY): Payer: Self-pay | Admitting: Emergency Medicine

## 2020-06-26 DIAGNOSIS — Z760 Encounter for issue of repeat prescription: Secondary | ICD-10-CM | POA: Insufficient documentation

## 2020-06-26 DIAGNOSIS — M25561 Pain in right knee: Secondary | ICD-10-CM | POA: Insufficient documentation

## 2020-06-26 DIAGNOSIS — Z87891 Personal history of nicotine dependence: Secondary | ICD-10-CM | POA: Insufficient documentation

## 2020-06-26 DIAGNOSIS — Z79899 Other long term (current) drug therapy: Secondary | ICD-10-CM | POA: Insufficient documentation

## 2020-06-26 DIAGNOSIS — G8929 Other chronic pain: Secondary | ICD-10-CM | POA: Insufficient documentation

## 2020-06-26 DIAGNOSIS — I10 Essential (primary) hypertension: Secondary | ICD-10-CM | POA: Insufficient documentation

## 2020-06-26 MED ORDER — MELOXICAM 15 MG PO TABS: 15.0000 mg | ORAL_TABLET | Freq: Every day | ORAL | 0 refills | Status: AC

## 2020-06-26 MED ORDER — ATENOLOL 25 MG PO TABS
25.0000 mg | ORAL_TABLET | Freq: Two times a day (BID) | ORAL | 1 refills | Status: DC
Start: 2020-06-26 — End: 2020-08-06

## 2020-06-26 MED ORDER — ATENOLOL 25 MG PO TABS
25.0000 mg | ORAL_TABLET | Freq: Once | ORAL | Status: AC
  Administered 2020-06-26: 25 mg via ORAL
  Filled 2020-06-26: qty 1

## 2020-06-26 MED ORDER — OMEPRAZOLE 20 MG PO CPDR: 20.0000 mg | DELAYED_RELEASE_CAPSULE | Freq: Every day | ORAL | 0 refills | Status: DC

## 2020-06-26 NOTE — ED Provider Notes (Addendum)
Emergency Medicine Provider Triage Evaluation Note  Yvette Wilson , a 40 y.o. female  was evaluated in triage.  Pt complains of right knee pain for a few months, no injury and thought to be due to working and day to day activities. Taking 600mg  IBU daily for this. Denies redness, warmth, swelling, feels tight. Pain located across front of knee, worse with flexion to walk up stairs.   Review of Systems  Positive: Knee pain Negative: Redness, swelling  Physical Exam  There were no vitals taken for this visit. Gen:   Awake, no distress   Resp:  Normal effort  MSK:   Moves extremities without difficulty  Other:  TTP medial right knee  Medical Decision Making  Medically screening exam initiated at 7:27 PM.  Appropriate orders placed.  BREEANN REPOSA was informed that the remainder of the evaluation will be completed by another provider, this initial triage assessment does not replace that evaluation, and the importance of remaining in the ED until their evaluation is complete.     [name], PA-C 06/26/20 1941    06/28/20, PA-C 06/26/20 1949    06/28/20, MD 06/26/20 [phone]

## 2020-06-26 NOTE — ED Triage Notes (Signed)
Patient is complaining of right knee burning and pain. Patient did not injury as she know of. She states it has been hurting for a few months.

## 2020-06-26 NOTE — ED Provider Notes (Signed)
Yvette Wilson-EMERGENCY DEPT Provider Note   CSN: [id number] Arrival date & time: 06/26/20  1921     History Chief Complaint  Patient presents with  . Knee Injury    Yvette Wilson is a 40 y.o. female.  Yvette Wilson , a 40 y.o. female  was evaluated in triage.  Pt complains of right knee pain for a few months, no injury and thought to be due to working and day to day activities. Taking 600mg  IBU daily for this. Denies redness, warmth, swelling, feels tight. Pain located across front of knee, worse with flexion to walk up stairs. Also requests refill of BP meds, has been out for several months, no complaints of CP, SHOB, weakness, numbness.         Past Medical History:  Diagnosis Date  . Gestational diabetes    Gestational, diet treated  . Gestational hypertension 11/09/2015  . History of pre-eclampsia 1999   birth of first child  . Hypertension    with all pregnancies, and then continued after last pregnancy  . Sickle cell trait [facility])     Patient Active Problem List   Diagnosis Date Noted  . Prediabetes 06/25/2019  . Anxiety attack 07/07/2014    Past Surgical History:  Procedure Laterality Date  . APPENDECTOMY     Dec 2007  . CESAREAN SECTION     May 2008, breech  . CESAREAN SECTION WITH BILATERAL TUBAL LIGATION N/A 12/17/2015   Procedure: CESAREAN SECTION WITH BILATERAL TUBAL LIGATION;  Surgeon: 13/03/2015, MD;  Location: [facility] ORS;  Service: Obstetrics;  Laterality: N/A;     OB History    Gravida  4   Para  4   Term  4   Preterm      AB      Living  4     SAB      IAB      Ectopic      Multiple  0   Live Births  1        Obstetric Comments  SVD x 2 and then c/s x 2 with BTL with last c/s        Family History  Problem Relation Age of Onset  . Cancer Maternal Aunt     Social History   Tobacco Use  . Smoking status: Former Smoker    Packs/day: 0.50    Years: 3.00    Pack years: 1.50    Types:  Cigarettes    Quit date: 02/26/2005    Years since quitting: 15.3  . Smokeless tobacco: Never Used  Substance Use Topics  . Alcohol use: No  . Drug use: No    Home Medications Prior to Admission medications   Medication Sig Start Date End Date Taking? Authorizing Provider  meloxicam (MOBIC) 15 MG tablet Take 1 tablet (15 mg total) by mouth daily for 14 days. 06/26/20 07/10/20 Yes 07/12/20, PA-C  omeprazole (PRILOSEC) 20 MG capsule Take 1 capsule (20 mg total) by mouth daily. 06/26/20 07/26/20 Yes 09/25/20, PA-C  atenolol (TENORMIN) 25 MG tablet Take 1 tablet (25 mg total) by mouth 2 (two) times daily. 06/26/20   06/28/20, PA-C  ibuprofen (ADVIL) 800 MG tablet Take 1 tablet (800 mg total) by mouth every 8 (eight) hours as needed for moderate pain or cramping. Patient not taking: Reported on 07/26/2019 05/07/19   [name], 05/09/19, MD  medroxyPROGESTERone (PROVERA) 10 MG tablet Take 1 tablet (10 mg  total) by mouth daily. 1 tab po 3x/day x 3 days and then 1 tab po qday Patient not taking: Reported on 07/26/2019 07/05/19   [name], MD  norethindrone (MICRONOR) 0.35 MG tablet Take 1 tablet (0.35 mg total) by mouth daily. 07/26/19   [place] Bing, MD    Allergies    Penicillins  Review of Systems   Review of Systems  Constitutional: Negative for fever.  Eyes: Negative for visual disturbance.  Respiratory: Negative for shortness of breath.   Cardiovascular: Negative for chest pain and leg swelling.  Musculoskeletal: Positive for arthralgias. Negative for joint swelling.  Skin: Negative for color change, rash and wound.  Allergic/Immunologic: Negative for immunocompromised state.  Neurological: Negative for weakness and numbness.  All other systems reviewed and are negative.   Physical Exam Updated Vital Signs BP (!) 227/127 (BP Location: Right Arm)   Pulse 80   Temp 98.4 F (36.9 C) (Oral)   Resp 18   Ht 5\' 8"  (1.727 m)   Wt 135.6 kg   SpO2 100%    BMI 45.46 kg/m   Physical Exam Vitals and nursing note reviewed.  Constitutional:      General: She is not in acute distress.    Appearance: She is well-developed. She is not diaphoretic.  HENT:     Head: Normocephalic and atraumatic.  Cardiovascular:     Pulses: Normal pulses.  Pulmonary:     Effort: Pulmonary effort is normal.  Musculoskeletal:        General: Tenderness present. No swelling or deformity.     Right knee: No swelling, deformity, effusion, erythema, ecchymosis or bony tenderness. Decreased range of motion. No LCL laxity or MCL laxity.     Comments: Medial joint line tenderness.   Skin:    General: Skin is warm and dry.     Findings: No erythema or rash.  Neurological:     Mental Status: She is alert and oriented to person, place, and time.     Sensory: No sensory deficit.     Motor: No weakness.     Gait: Gait normal.  Psychiatric:        Behavior: Behavior normal.     ED Results / Procedures / Treatments   Labs (all labs ordered are listed, but only abnormal results are displayed) Labs Reviewed - No data to display  EKG None  Radiology No results found.  Procedures Procedures   Medications Ordered in ED Medications  atenolol (TENORMIN) tablet 25 mg (has no administration in time range)    ED Course  I have reviewed the triage vital signs and the nursing notes.  Pertinent labs & imaging results that were available during my care of the patient were reviewed by me and considered in my medical decision making (see chart for details).  Clinical Course as of 06/26/20 06/28/20  [id number] Jun 26, 2020  3518 40 year old female with complaint of ongoing right knee pain, seen in the ED at the end of March for same with x-ray that was unremarkable, referred to orthopedics for follow-up however does not have insurance and has not been able to follow-up.  On exam, found to have medial joint line tenderness, no crepitus, no effusion, no erythema.  Patient is worse  with flexion of the knee, is able to straight leg raise without difficulty.  Plan is for orthopedic referral with prescription for meloxicam.  Patient states that she has had abdominal pain from taking her ibuprofen daily, also given prescription for Prilosec.  Patient is found to be hypertensive in the ED today without complaints related to same.  Plan is to give her a dose of her atenolol while in the emergency room with a prescription for her atenolol sent to her pharmacy, also given referral to local primary care for follow-up with strict return to ED precautions. [LM]    Clinical Course User Index [LM] Alden Hipp   MDM Rules/Calculators/A&P                          Final Clinical Impression(s) / ED Diagnoses Final diagnoses:  Chronic pain of right knee  Medication refill    Rx / DC Orders ED Discharge Orders         Ordered    meloxicam (MOBIC) 15 MG tablet  Daily        06/26/20 1944    omeprazole (PRILOSEC) 20 MG capsule  Daily        06/26/20 1944    atenolol (TENORMIN) 25 MG tablet  2 times daily        06/26/20 1944           Alden Hipp 06/26/20 Forrest Moron, MD 06/26/20 [phone]

## 2020-06-26 NOTE — Discharge Instructions (Addendum)
Take Meloxicam daily as prescribed for pain, Follow up with orthopedics, call to schedule an appointment.  Follow up with primary care for blood pressure management. Return for chest pain, shortness of breath, weakness/numbness or other concerning symptoms.

## 2020-08-04 ENCOUNTER — Emergency Department (HOSPITAL_COMMUNITY): Payer: Medicaid Other

## 2020-08-04 ENCOUNTER — Other Ambulatory Visit: Payer: Self-pay

## 2020-08-04 ENCOUNTER — Encounter (HOSPITAL_COMMUNITY): Payer: Self-pay

## 2020-08-04 ENCOUNTER — Observation Stay (HOSPITAL_COMMUNITY)
Admission: EM | Admit: 2020-08-04 | Discharge: 2020-08-06 | Disposition: A | Discharge status: Home / Self Care | Payer: Medicaid Other | Attending: Emergency Medicine | Admitting: Emergency Medicine

## 2020-08-04 DIAGNOSIS — Z20822 Contact with and (suspected) exposure to covid-19: Secondary | ICD-10-CM | POA: Diagnosis not present

## 2020-08-04 DIAGNOSIS — I160 Hypertensive urgency: Principal | ICD-10-CM | POA: Diagnosis present

## 2020-08-04 DIAGNOSIS — R2243 Localized swelling, mass and lump, lower limb, bilateral: Secondary | ICD-10-CM | POA: Insufficient documentation

## 2020-08-04 DIAGNOSIS — D696 Thrombocytopenia, unspecified: Secondary | ICD-10-CM | POA: Diagnosis not present

## 2020-08-04 DIAGNOSIS — I10 Essential (primary) hypertension: Secondary | ICD-10-CM | POA: Diagnosis not present

## 2020-08-04 DIAGNOSIS — R7303 Prediabetes: Secondary | ICD-10-CM | POA: Insufficient documentation

## 2020-08-04 DIAGNOSIS — Z79899 Other long term (current) drug therapy: Secondary | ICD-10-CM | POA: Diagnosis not present

## 2020-08-04 DIAGNOSIS — R001 Bradycardia, unspecified: Secondary | ICD-10-CM | POA: Diagnosis not present

## 2020-08-04 DIAGNOSIS — D72829 Elevated white blood cell count, unspecified: Secondary | ICD-10-CM

## 2020-08-04 DIAGNOSIS — Z87891 Personal history of nicotine dependence: Secondary | ICD-10-CM | POA: Insufficient documentation

## 2020-08-04 DIAGNOSIS — R519 Headache, unspecified: Secondary | ICD-10-CM | POA: Diagnosis present

## 2020-08-04 LAB — CBC WITH DIFFERENTIAL/PLATELET
Abs Immature Granulocytes: 0.05 10*3/uL (ref 0.00–0.07)
Basophils Absolute: 0.1 10*3/uL (ref 0.0–0.1)
Basophils Relative: 1 %
Eosinophils Absolute: 0.2 10*3/uL (ref 0.0–0.5)
Eosinophils Relative: 2 %
HCT: 34.8 % — ABNORMAL LOW (ref 36.0–46.0)
Hemoglobin: 10.4 g/dL — ABNORMAL LOW (ref 12.0–15.0)
Immature Granulocytes: 0 %
Lymphocytes Relative: 36 %
Lymphs Abs: 4.1 10*3/uL — ABNORMAL HIGH (ref 0.7–4.0)
MCH: 21.5 pg — ABNORMAL LOW (ref 26.0–34.0)
MCHC: 29.9 g/dL — ABNORMAL LOW (ref 30.0–36.0)
MCV: 72 fL — ABNORMAL LOW (ref 80.0–100.0)
Monocytes Absolute: 1.1 10*3/uL — ABNORMAL HIGH (ref 0.1–1.0)
Monocytes Relative: 9 %
Neutro Abs: 5.8 10*3/uL (ref 1.7–7.7)
Neutrophils Relative %: 52 %
Platelets: 59 10*3/uL — ABNORMAL LOW (ref 150–400)
RBC: 4.83 MIL/uL (ref 3.87–5.11)
RDW: 18.5 % — ABNORMAL HIGH (ref 11.5–15.5)
WBC: 11.4 10*3/uL — ABNORMAL HIGH (ref 4.0–10.5)
nRBC: 0 % (ref 0.0–0.2)

## 2020-08-04 LAB — BASIC METABOLIC PANEL
Anion gap: 8 (ref 5–15)
BUN: 15 mg/dL (ref 6–20)
CO2: 27 mmol/L (ref 22–32)
Calcium: 9.3 mg/dL (ref 8.9–10.3)
Chloride: 107 mmol/L (ref 98–111)
Creatinine, Ser: 1.13 mg/dL — ABNORMAL HIGH (ref 0.44–1.00)
GFR, Estimated: >60 mL/min (ref >60)
Glucose, Bld: 90 mg/dL (ref 70–99)
Potassium: 4.3 mmol/L (ref 3.5–5.1)
Sodium: 142 mmol/L (ref 135–145)

## 2020-08-04 LAB — RESP PANEL BY RT-PCR (FLU A&B, COVID) ARPGX2
Influenza A by PCR: NEGATIVE
Influenza B by PCR: NEGATIVE
SARS Coronavirus 2 by RT PCR: NEGATIVE

## 2020-08-04 LAB — TROPONIN I (HIGH SENSITIVITY): Troponin I (High Sensitivity): 4 ng/L (ref <18)

## 2020-08-04 LAB — TSH: TSH: 0.608 u[IU]/mL (ref 0.350–4.500)

## 2020-08-04 MED ORDER — HYDRALAZINE HCL 20 MG/ML IJ SOLN
10.0000 mg | Freq: Once | INTRAMUSCULAR | Status: AC
  Administered 2020-08-05: 10 mg via INTRAVENOUS
  Filled 2020-08-04: qty 1

## 2020-08-04 NOTE — ED Provider Notes (Signed)
Emergency Medicine Provider Triage Evaluation Note  Yvette Wilson , a 40 y.o. female  was evaluated in triage.  Pt complains of headache and weakness x 4 days.  Lower extremity swelling, worse after long shifts. Works in [employer], product/process development.  Patient concerned for blood pressure. Taking medications.   Review of Systems  Positive: Headache, weakness, bilateral extremity swelling. Negative: Redness, unilateral. Hx clots.   Physical Exam  BP (!) 156/108 (BP Location: Left Arm)   Pulse (!) 56   Temp 98.5 F (36.9 C) (Oral)   Resp 16   Ht 5\' 8"  (1.727 m)   Wt 135.6 kg   LMP 07/26/2020 (Exact Date)   SpO2 100%   BMI 45.46 kg/m  Gen:   Awake, no distress   Resp:  Normal effort  MSK:   Moves extremities without difficulty  Other:  Lower extremity swelling. Non pitting. No redness. Neurovascularly intact.  Medical Decision Making  Medically screening exam initiated at 2:45 PM.  Appropriate orders placed.  Yvette Wilson was informed that the remainder of the evaluation will be completed by another provider, this initial triage assessment does not replace that evaluation, and the importance of remaining in the ED until their evaluation is complete.  Will obtain basic labs, COVID/influenza testing.  Lower extremity swelling is good story for dependent edema. Improves with leg elevation.  Will need compression stockings.     [name], PA-C 08/04/20 1445    08/06/20, MD 08/04/20 1613    08/06/20, MD 08/04/20 [phone]

## 2020-08-04 NOTE — ED Triage Notes (Signed)
Patient c/o bilateral ankle swelling x 3 days R>L. Patient also c/o headache since last night and fatigue x 3 days.

## 2020-08-04 NOTE — ED Provider Notes (Signed)
[facility]-EMERGENCY DEPT Provider Note   CSN: [id number] Arrival date & time: 08/04/20  1247     History Chief Complaint  Patient presents with   Joint Swelling   Fatigue   Headache    Yvette Wilson is a 40 y.o. female.   Headache Associated symptoms: no abdominal pain, no back pain, no congestion and no cough   Patient with headache and swelling in her legs.  States she is worried her blood pressure is up.  History of hypertension with her pregnancies that then went up and state after the last pregnancy.  States that that was in 2017.  Is been consistent with her medications over the last few days has had more swelling her legs.  This is new or not something she is really dealt with before.  States it is worse after standing for a long shift at work.  Does improve some with elevation but does not go away.  Also has a dull throbbing headache.  Diffuse.  No vision changes.  States she tends to get headaches when her blood pressure goes up but usually does not last this long.  States she also bruised when they drew her blood which was unusual for her.  No chest pain.  No trouble breathing.    Past Medical History:  Diagnosis Date   Gestational diabetes    Gestational, diet treated   Gestational hypertension 11/09/2015   History of pre-eclampsia 1999   birth of first child   Hypertension    with all pregnancies, and then continued after last pregnancy   Sickle cell trait [facility])     Patient Active Problem List   Diagnosis Date Noted   Prediabetes 06/25/2019   Anxiety attack 07/07/2014    Past Surgical History:  Procedure Laterality Date   APPENDECTOMY     Dec 2007   CESAREAN SECTION     May 2008, breech   CESAREAN SECTION WITH BILATERAL TUBAL LIGATION N/A 12/17/2015   Procedure: CESAREAN SECTION WITH BILATERAL TUBAL LIGATION;  Surgeon: Vena Austria, MD;  Location: [facility] ORS;  Service: Obstetrics;  Laterality: N/A;     OB History     Gravida  4    Para  4   Term  4   Preterm      AB      Living  4      SAB      IAB      Ectopic      Multiple  0   Live Births  1        Obstetric Comments  SVD x 2 and then c/s x 2 with BTL with last c/s         Family History  Problem Relation Age of Onset   Cancer Maternal Aunt     Social History   Tobacco Use   Smoking status: Former    Packs/day: 0.50    Years: 3.00    Pack years: 1.50    Types: Cigarettes    Quit date: 02/26/2005    Years since quitting: 15.4   Smokeless tobacco: Never  Vaping Use   Vaping Use: Never used  Substance Use Topics   Alcohol use: No   Drug use: No    Home Medications Prior to Admission medications   Medication Sig Start Date End Date Taking? Authorizing Provider  atenolol (TENORMIN) 25 MG tablet Take 1 tablet (25 mg total) by mouth 2 (two) times daily. 06/26/20  Yes Eulah Pont,  Gerome Apley, PA-C  ibuprofen (ADVIL) 800 MG tablet Take 1 tablet (800 mg total) by mouth every 8 (eight) hours as needed for moderate pain or cramping. Patient not taking: No sig reported 05/07/19   Little, Ambrose Finland, MD  medroxyPROGESTERone (PROVERA) 10 MG tablet Take 1 tablet (10 mg total) by mouth daily. 1 tab po 3x/day x 3 days and then 1 tab po qday Patient not taking: No sig reported 07/05/19   [place] Bing, MD  norethindrone (MICRONOR) 0.35 MG tablet Take 1 tablet (0.35 mg total) by mouth daily. Patient not taking: No sig reported 07/26/19   [place] Bing, MD  omeprazole (PRILOSEC) 20 MG capsule Take 1 capsule (20 mg total) by mouth daily. Patient not taking: Reported on 08/04/2020 06/26/20 07/26/20  Jeannie Fend, PA-C    Allergies    Penicillins  Review of Systems   Review of Systems  Constitutional:  Negative for appetite change.  HENT:  Negative for congestion.   Eyes:  Negative for visual disturbance.  Respiratory:  Negative for cough and shortness of breath.   Cardiovascular:  Positive for leg swelling.  Gastrointestinal:  Negative  for abdominal pain.  Genitourinary:  Negative for flank pain.  Musculoskeletal:  Negative for back pain.  Neurological:  Positive for headaches.  Hematological:  Negative for adenopathy. Bruises/bleeds easily.  Psychiatric/Behavioral:  Negative for confusion.    Physical Exam Updated Vital Signs BP (!) 186/90   Pulse (!) 58   Temp 98.5 F (36.9 C) (Oral)   Resp 16   Ht 5\' 8"  (1.727 m)   Wt 135.6 kg   LMP 07/26/2020 (Exact Date)   SpO2 100%   BMI 45.46 kg/m   Physical Exam Vitals and nursing note reviewed.  HENT:     Head: Normocephalic and atraumatic.  Eyes:     Pupils: Pupils are equal, round, and reactive to light.  Cardiovascular:     Rate and Rhythm: Regular rhythm.  Pulmonary:     Breath sounds: No wheezing or rhonchi.  Chest:     Chest wall: No tenderness.  Abdominal:     General: There is no distension.  Musculoskeletal:     Cervical back: Normal range of motion.     Comments: Mild pitting edema bilateral lower extremities.  Skin:    Comments: Mild bruising at site of IV on right antecubital area.  Neurological:     Mental Status: She is alert and oriented to person, place, and time.  Psychiatric:        Mood and Affect: Mood normal.    ED Results / Procedures / Treatments   Labs (all labs ordered are listed, but only abnormal results are displayed) Labs Reviewed  CBC WITH DIFFERENTIAL/PLATELET - Abnormal; Notable for the following components:      Result Value   WBC 11.4 (*)    Hemoglobin 10.4 (*)    HCT 34.8 (*)    MCV 72.0 (*)    MCH 21.5 (*)    MCHC 29.9 (*)    RDW 18.5 (*)    Platelets 59 (*)    Lymphs Abs 4.1 (*)    Monocytes Absolute 1.1 (*)    All other components within normal limits  BASIC METABOLIC PANEL - Abnormal; Notable for the following components:   Creatinine, Ser 1.13 (*)    All other components within normal limits  RESP PANEL BY RT-PCR (FLU A&B, COVID) ARPGX2  TSH  TROPONIN I (HIGH SENSITIVITY)  TROPONIN I (HIGH  SENSITIVITY)  EKG EKG Interpretation  Date/Time:  Tuesday August 04 2020 21:07:51 EDT Ventricular Rate:  54 PR Interval:  179 QRS Duration: 88 QT Interval:  440 QTC Calculation: 417 R Axis:   63 Text Interpretation: Sinus rhythm Left ventricular hypertrophy lvh is new Confirmed by Benjiman Core [phone]) on 08/04/2020 9:47:19 PM  Radiology CT Head Wo Contrast  Result Date: 08/04/2020 CLINICAL DATA:  40 year old female with headache. EXAM: CT HEAD WITHOUT CONTRAST TECHNIQUE: Contiguous axial images were obtained from the base of the skull through the vertex without intravenous contrast. COMPARISON:  Head CT dated 02/09/2010. FINDINGS: Brain: No evidence of acute infarction, hemorrhage, hydrocephalus, extra-axial collection or mass lesion/mass effect. Vascular: No hyperdense vessel or unexpected calcification. Skull: Normal. Negative for fracture or focal lesion. Sinuses/Orbits: No acute finding. Other: None IMPRESSION: Normal noncontrast CT of the brain. Electronically Signed   By: Elgie Collard M.D.   On: 08/04/2020 20:56   DG Chest Portable 1 View  Result Date: 08/04/2020 CLINICAL DATA:  Weakness.  Fatigue.  Patient reports ankle swelling. EXAM: PORTABLE CHEST 1 VIEW COMPARISON:  Radiograph 11/21/2013 FINDINGS: Enlarged cardiac silhouette may be in part accentuated by portable technique. Stable normal mediastinal contours. No pulmonary edema, focal airspace disease, large pleural effusion or pneumothorax. No acute osseous abnormalities are seen. IMPRESSION: Mild cardiomegaly, may be in part accentuated by portable technique. No acute pulmonary process. Electronically Signed   By: Narda Rutherford M.D.   On: 08/04/2020 20:48    Procedures Procedures   Medications Ordered in ED Medications - No data to display  ED Course  I have reviewed the triage vital signs and the nursing notes.  Pertinent labs & imaging results that were available during my care of the patient were reviewed  by me and considered in my medical decision making (see chart for details).    MDM Rules/Calculators/A&P                          Patient presents with headache and elevated blood pressure.  States increased swelling in her legs.  States both these unusual for her.  Blood pressure elevated upon arrival.  However pressures measured up to 260/140 I think this was an error.  I think it is more likely close to the 190/90.  However creatinine has increased.  Creatinine up to 1.1 baseline appears to be under 1.  Also has a bradycardia.  Is on atenolol as her primary medication.  With increasing swelling in her legs headaches and increasing creatinine I feels the patient benefit from more urgent adjustment of the medication.  Will discuss with hospitalist for admission.  Also had a headache and head CT done due to thrombocytopenia.  Platelets in the 50s.  No history of thrombocytopenia.  No recent infection.  Does have some mild bruising on her right arm from the IV. Final Clinical Impression(s) / ED Diagnoses Final diagnoses:  Hypertensive urgency  Thrombocytopenia [facility])    Rx / DC Orders ED Discharge Orders     None        Benjiman Core, MD 08/04/20 2303

## 2020-08-05 ENCOUNTER — Encounter (HOSPITAL_COMMUNITY): Payer: Self-pay | Admitting: Family Medicine

## 2020-08-05 ENCOUNTER — Observation Stay (HOSPITAL_BASED_OUTPATIENT_CLINIC_OR_DEPARTMENT_OTHER): Payer: Medicaid Other

## 2020-08-05 DIAGNOSIS — R0609 Other forms of dyspnea: Secondary | ICD-10-CM | POA: Diagnosis not present

## 2020-08-05 DIAGNOSIS — I160 Hypertensive urgency: Secondary | ICD-10-CM | POA: Diagnosis not present

## 2020-08-05 DIAGNOSIS — R001 Bradycardia, unspecified: Secondary | ICD-10-CM

## 2020-08-05 DIAGNOSIS — D696 Thrombocytopenia, unspecified: Secondary | ICD-10-CM

## 2020-08-05 DIAGNOSIS — D72829 Elevated white blood cell count, unspecified: Secondary | ICD-10-CM

## 2020-08-05 LAB — ECHOCARDIOGRAM COMPLETE
Area-P 1/2: 4.21 cm2
Height: 68 in
S' Lateral: 3.3 cm
Weight: 4784 oz

## 2020-08-05 LAB — CBC
HCT: 34.1 % — ABNORMAL LOW (ref 36.0–46.0)
Hemoglobin: 10.6 g/dL — ABNORMAL LOW (ref 12.0–15.0)
MCH: 22.1 pg — ABNORMAL LOW (ref 26.0–34.0)
MCHC: 31.1 g/dL (ref 30.0–36.0)
MCV: 71 fL — ABNORMAL LOW (ref 80.0–100.0)
Platelets: 331 10*3/uL (ref 150–400)
RBC: 4.8 MIL/uL (ref 3.87–5.11)
RDW: 18.5 % — ABNORMAL HIGH (ref 11.5–15.5)
WBC: 10.9 10*3/uL — ABNORMAL HIGH (ref 4.0–10.5)
nRBC: 0 % (ref 0.0–0.2)

## 2020-08-05 LAB — BASIC METABOLIC PANEL
Anion gap: 9 (ref 5–15)
BUN: 11 mg/dL (ref 6–20)
CO2: 27 mmol/L (ref 22–32)
Calcium: 9 mg/dL (ref 8.9–10.3)
Chloride: 104 mmol/L (ref 98–111)
Creatinine, Ser: 0.84 mg/dL (ref 0.44–1.00)
GFR, Estimated: >60 mL/min (ref >60)
Glucose, Bld: 96 mg/dL (ref 70–99)
Potassium: 3 mmol/L — ABNORMAL LOW (ref 3.5–5.1)
Sodium: 140 mmol/L (ref 135–145)

## 2020-08-05 LAB — HIV ANTIBODY (ROUTINE TESTING W REFLEX): HIV Screen 4th Generation wRfx: NONREACTIVE

## 2020-08-05 LAB — BRAIN NATRIURETIC PEPTIDE: B Natriuretic Peptide: 53.7 pg/mL (ref 0.0–100.0)

## 2020-08-05 LAB — TROPONIN I (HIGH SENSITIVITY): Troponin I (High Sensitivity): 5 ng/L (ref <18)

## 2020-08-05 LAB — TSH: TSH: 0.805 u[IU]/mL (ref 0.350–4.500)

## 2020-08-05 MED ORDER — ONDANSETRON HCL 4 MG/2ML IJ SOLN: 4.0000 mg | Freq: Four times a day (QID) | INTRAMUSCULAR | Status: DC | PRN

## 2020-08-05 MED ORDER — TRAZODONE HCL 50 MG PO TABS: 25.0000 mg | ORAL_TABLET | Freq: Every evening | ORAL | Status: DC | PRN

## 2020-08-05 MED ORDER — POTASSIUM CHLORIDE 20 MEQ PO PACK: 40.0000 meq | PACK | Freq: Once | ORAL | Status: DC

## 2020-08-05 MED ORDER — AMLODIPINE BESYLATE 10 MG PO TABS
10.0000 mg | ORAL_TABLET | Freq: Every day | ORAL | Status: DC
  Administered 2020-08-05 – 2020-08-06 (×3): 10 mg via ORAL
  Filled 2020-08-05 (×3): qty 1

## 2020-08-05 MED ORDER — LACTATED RINGERS IV SOLN: INTRAVENOUS | Status: DC

## 2020-08-05 MED ORDER — KETOROLAC TROMETHAMINE 30 MG/ML IJ SOLN
30.0000 mg | Freq: Once | INTRAMUSCULAR | Status: AC
  Administered 2020-08-05: 30 mg via INTRAVENOUS
  Filled 2020-08-05: qty 1

## 2020-08-05 MED ORDER — LOSARTAN POTASSIUM 50 MG PO TABS
50.0000 mg | ORAL_TABLET | Freq: Every day | ORAL | Status: DC
  Administered 2020-08-05 – 2020-08-06 (×3): 50 mg via ORAL
  Filled 2020-08-05 (×3): qty 1

## 2020-08-05 MED ORDER — POTASSIUM CHLORIDE 20 MEQ PO PACK
40.0000 meq | PACK | Freq: Once | ORAL | Status: AC
  Administered 2020-08-05: 40 meq via ORAL
  Filled 2020-08-05: qty 2

## 2020-08-05 MED ORDER — ACETAMINOPHEN 650 MG RE SUPP: 650.0000 mg | Freq: Four times a day (QID) | RECTAL | Status: DC | PRN

## 2020-08-05 MED ORDER — ACETAMINOPHEN 325 MG PO TABS
650.0000 mg | ORAL_TABLET | Freq: Four times a day (QID) | ORAL | Status: DC | PRN
  Administered 2020-08-05 – 2020-08-06 (×2): 650 mg via ORAL
  Filled 2020-08-05 (×2): qty 2

## 2020-08-05 MED ORDER — ONDANSETRON HCL 4 MG PO TABS
4.0000 mg | ORAL_TABLET | Freq: Four times a day (QID) | ORAL | Status: DC | PRN
  Administered 2020-08-05: 4 mg via ORAL
  Filled 2020-08-05: qty 1

## 2020-08-05 NOTE — Progress Notes (Signed)
  Echocardiogram 2D Echocardiogram with 3D has been performed.  Leta Jungling M 08/05/2020, 1:42 PM

## 2020-08-05 NOTE — Care Plan (Signed)
This 40 years old female with PMH significant for hypertension, gestational diabetes presents in the ED for the evaluation of headache and swelling of her legs.  She reports she does stand on her feet all day at work and denies any injury or trauma to her legs.  She denies any recent travel or prolonged immobilization.  She presented to the ED for worsening headache.She is found to have BP 266 /136, HR 50 associated with headache.  Patient was given hydralazine and patient was admitted for hypertensive urgency.  Patient takes metoprolol at home.  Metoprolol was discontinued and started on Norvasc and Cozaar for blood pressure control.  Echocardiogram is completed report is pending. Patient was seen and examined at bedside.  She reports still having a headache,  blood pressure is improving.

## 2020-08-05 NOTE — H&P (Signed)
Further recommendation History and Physical    Yvette Wilson QPR:[id number] DOB: 1980/09/10 DOA: 08/04/2020  [facility]: [facility]   Patient coming from: Home  Chief Complaint: headache, swelling of legs, worried BP is elevated.   HPI: Yvette Wilson is a 40 y.o. female with medical history significant for HTN, gestational diabetes who presents for evaluation of headache and swelling of her legs.  She reports that for the last few days she has had swelling of her legs the right more than the left.  She does stand on her feet all day at work and has not had any injury or trauma to her legs.  She denies any recent travel or prolonged immobilization.  She reports having a headache in the bilateral temporal region that feels like a pressure but is not throbbing and does not radiate.  She has not had any nausea or vomiting associate with a headache.  She denies any stiff neck, fever, visual change or auras associated with the headache.  She reports headache is been present for the last few days and she has taken Tylenol and Motrin has not helped it.  Today she felt worse after standing on her feet for a long shift at work.  She denies any shortness of breath or cough.  She denies any abdominal pain, nausea, vomiting, diarrhea, urinary symptoms.  She does state that she developed a bruise when they drew her blood earlier when she arrived to the ER which is not usual for her.  She reports she took her morning dose of atenolol but has not had her nighttime dose. She has history of smoking but quit over 10 years ago.  She drinks alcohol very rarely.  She denies illicit drug use  ED Course: Yvette Wilson has had a BP of 156-266/90-136 in the ER. She has not had chest pain.  She continues to have a headache but is mentating normally and answers all questions appropriately.  Lab work reveals a troponin of 4, sodium 142, potassium 4.3, chloride 107, bicarb 27, creatinine 1.13, baseline creatinine  is 0.9, BUN 15, glucose 90.  WBC 11,400, hemoglobin 10.4, hematocrit 34.8, platelets 59,000, TSH 0.608.  Patient was given dose of hydralazine in the emergency room.  Hospitalist service been asked to admit for further management and adjustment of antihypertensive therapy  Review of Systems:  General: Denies fever, chills, weight loss, night sweats.  Denies dizziness.  Denies change in appetite HENT: Denies head trauma, denies change in hearing, tinnitus.  Denies nasal congestion or bleeding.  Denies sore throat, sores in mouth.  Denies difficulty swallowing Eyes: Denies blurry vision, pain in eye, drainage. Denies discoloration of eyes. Neck: Denies pain.  Denies swelling.  Denies pain with movement. Cardiovascular: Reports edema of lower legs bilaterally, right more than left. Denies chest pain, palpitations. Denies orthopnea Respiratory: Denies shortness of breath, cough. Denies wheezing. Denies sputum production Gastrointestinal: Denies abdominal pain, swelling.  Denies nausea, vomiting, diarrhea.  Denies melena.  Denies hematemesis. Musculoskeletal: Denies limitation of movement.  Denies deformity or swelling. Denies arthralgias or myalgias. Genitourinary: Denies pelvic pain.  Denies urinary frequency or hesitancy. Denies dysuria.  Skin: Denies rash.  Denies petechiae, purpura, ecchymosis. Neurological: Reports headache. Denies syncope. Denies seizure activity. Denies paresthesia. Denies slurred speech, drooping face.  Denies visual change. Psychiatric: Denies depression, anxiety.  Denies hallucinations.  Past Medical History:  Diagnosis Date   Gestational diabetes    Gestational, diet treated   Gestational hypertension 11/09/2015  History of pre-eclampsia 1999   birth of first child   Hypertension    with all pregnancies, and then continued after last pregnancy   Sickle cell trait [facility])     Past Surgical History:  Procedure Laterality Date   APPENDECTOMY     Dec 2007   CESAREAN  SECTION     May 2008, breech   CESAREAN SECTION WITH BILATERAL TUBAL LIGATION N/A 12/17/2015   Procedure: CESAREAN SECTION WITH BILATERAL TUBAL LIGATION;  Surgeon: Vena Austria, MD;  Location: [facility] ORS;  Service: Obstetrics;  Laterality: N/A;    Social History  reports that she quit smoking about 15 years ago. Her smoking use included cigarettes. She has a 1.50 pack-year smoking history. She has never used smokeless tobacco. She reports that she does not drink alcohol and does not use drugs.  Allergies  Allergen Reactions   Penicillins Rash    Has patient had a PCN reaction causing immediate rash, facial/tongue/throat swelling, SOB or lightheadedness with hypotension: YES Has patient had a PCN reaction causing severe rash involving mucus membranes or skin necrosis: NO Has patient had a PCN reaction that required hospitalization  NO Has patient had a PCN reaction occurring within the last 10 years:  NO     Family History  Problem Relation Age of Onset   Cancer Maternal Aunt      Prior to Admission medications   Medication Sig Start Date End Date Taking? Authorizing Provider  atenolol (TENORMIN) 25 MG tablet Take 1 tablet (25 mg total) by mouth 2 (two) times daily. 06/26/20  Yes Jeannie Fend, PA-C  ibuprofen (ADVIL) 800 MG tablet Take 1 tablet (800 mg total) by mouth every 8 (eight) hours as needed for moderate pain or cramping. Patient not taking: No sig reported 05/07/19   Little, Ambrose Finland, MD  medroxyPROGESTERone (PROVERA) 10 MG tablet Take 1 tablet (10 mg total) by mouth daily. 1 tab po 3x/day x 3 days and then 1 tab po qday Patient not taking: No sig reported 07/05/19   [place]vana Bing, MD  norethindrone (MICRONOR) 0.35 MG tablet Take 1 tablet (0.35 mg total) by mouth daily. Patient not taking: No sig reported 07/26/19   [place] Bing, MD  omeprazole (PRILOSEC) 20 MG capsule Take 1 capsule (20 mg total) by mouth daily. Patient not taking: Reported on 08/04/2020  06/26/20 07/26/20  Jeannie Fend, PA-C    Physical Exam: Vitals:   08/04/20 1638 08/04/20 1945 08/04/20 2134 08/04/20 2326  BP: (!) 216/120 (!) 266/136 (!) 186/90 (!) 174/98  Pulse: 60 66 (!) 58 (!) 56  Resp: 18 18 16 16   Temp:      TempSrc:      SpO2: 100% 100% 100% 100%  Weight:      Height:        Constitutional: NAD, calm, comfortable Vitals:   08/04/20 1638 08/04/20 1945 08/04/20 2134 08/04/20 2326  BP: (!) 216/120 (!) 266/136 (!) 186/90 (!) 174/98  Pulse: 60 66 (!) 58 (!) 56  Resp: 18 18 16 16   Temp:      TempSrc:      SpO2: 100% 100% 100% 100%  Weight:      Height:       General: WDWN, Alert and oriented x3.  Eyes: EOMI, PERRL, conjunctivae normal.  Sclera nonicteric HENT:  [place]/AT, external ears normal. Nares patent without epistasis. Mucous membranes are moist. Posterior pharynx clear of any exudate or lesions.  Neck: Soft, normal range of motion, supple, no masses,  no thyromegaly. Trachea midline Respiratory: clear to auscultation bilaterally, no wheezing, no crackles. Normal respiratory effort. No accessory muscle use.  Cardiovascular: Regular rhythm with bradycardia, no murmurs / rubs / gallops. Mild lower extremity edema. 1+ pedal pulses.   Abdomen: Soft, no tenderness, nondistended, no rebound or guarding.  No masses palpated. Obese. Bowel sounds normoactive Musculoskeletal: FROM. no cyanosis. No joint deformity upper and lower extremities. Normal muscle tone. Negative Homan's sign bilaterally Skin: Warm, dry, intact no rashes, lesions, ulcers. No induration Neurologic: CN 2-12 grossly intact.  Normal speech.  Sensation intact. Strength 5/5 in all extremities.   Psychiatric: Normal judgment and insight.  Normal mood.    Labs on Admission: I have personally reviewed following labs and imaging studies  CBC: Recent Labs  Lab 08/04/20 1537  WBC 11.4*  NEUTROABS 5.8  HGB 10.4*  HCT 34.8*  MCV 72.0*  PLT 59*    Basic Metabolic Panel: Recent Labs  Lab  08/04/20 1537  NA 142  K 4.3  CL 107  CO2 27  GLUCOSE 90  BUN 15  CREATININE 1.13*  CALCIUM 9.3    GFR: Estimated Creatinine Clearance: 97.7 mL/min (A) (by C-G formula based on SCr of 1.13 mg/dL (H)).  Liver Function Tests: No results for input(s): AST, ALT, ALKPHOS, BILITOT, PROT, ALBUMIN in the last 168 hours.  Urine analysis:    Component Value Date/Time   COLORURINE YELLOW 05/06/2019 1415   APPEARANCEUR CLEAR 05/06/2019 1415   APPEARANCEUR Hazy 11/21/2013 1053   LABSPEC 1.015 05/06/2019 1415   LABSPEC 1.018 11/21/2013 1053   PHURINE 5.0 05/06/2019 1415   GLUCOSEU NEGATIVE 05/06/2019 1415   GLUCOSEU Negative 11/21/2013 1053   HGBUR NEGATIVE 05/06/2019 1415   BILIRUBINUR NEGATIVE 05/06/2019 1415   BILIRUBINUR Negative 11/21/2013 1053   KETONESUR NEGATIVE 05/06/2019 1415   PROTEINUR NEGATIVE 05/06/2019 1415   UROBILINOGEN 0.2 07/08/2014 1820   NITRITE NEGATIVE 05/06/2019 1415   LEUKOCYTESUR NEGATIVE 05/06/2019 1415   LEUKOCYTESUR Trace 11/21/2013 1053    Radiological Exams on Admission: CT Head Wo Contrast  Result Date: 08/04/2020 CLINICAL DATA:  40 year old female with headache. EXAM: CT HEAD WITHOUT CONTRAST TECHNIQUE: Contiguous axial images were obtained from the base of the skull through the vertex without intravenous contrast. COMPARISON:  Head CT dated 02/09/2010. FINDINGS: Brain: No evidence of acute infarction, hemorrhage, hydrocephalus, extra-axial collection or mass lesion/mass effect. Vascular: No hyperdense vessel or unexpected calcification. Skull: Normal. Negative for fracture or focal lesion. Sinuses/Orbits: No acute finding. Other: None IMPRESSION: Normal noncontrast CT of the brain. Electronically Signed   By: Elgie CollardArash  Radparvar M.D.   On: 08/04/2020 20:56   DG Chest Portable 1 View  Result Date: 08/04/2020 CLINICAL DATA:  Weakness.  Fatigue.  Patient reports ankle swelling. EXAM: PORTABLE CHEST 1 VIEW COMPARISON:  Radiograph 11/21/2013 FINDINGS:  Enlarged cardiac silhouette may be in part accentuated by portable technique. Stable normal mediastinal contours. No pulmonary edema, focal airspace disease, large pleural effusion or pneumothorax. No acute osseous abnormalities are seen. IMPRESSION: Mild cardiomegaly, may be in part accentuated by portable technique. No acute pulmonary process. Electronically Signed   By: Narda RutherfordMelanie  Sanford M.D.   On: 08/04/2020 20:48    EKG: Independently reviewed.  EKG shows normal sinus rhythm with a heart rate of 54.  LVH by voltage criteria.  No acute ST elevation or depression.  QTc 417  Assessment/Plan Principal Problem:   Hypertensive urgency Yvette Wilson is admitted to telemetry floor.  Given hydralazine in ER.  Start Norvasc and Cozaar  for BP control.  Monitor BP.  Check echocardiogram in am to evaluate wall motion, EF and valvular function with hypertensive urgency and edema of legs. Initial troponin was negative in the emergency room.  EKG with no signs of ischemia Reports headache with her elevated blood pressure.  Given dose of Toradol  Active Problems:   Thrombocytopenia Patient with platelet count of 59,000.  No bruising or bleeding she reports. Recheck CBC in morning.  If platelets remain low will need further work-up    Bradycardia Patient with heart rate in the 50s that is asymptomatic.  She is on atenolol which is stopped secondary to having significantly elevated blood pressure despite the therapy    Leukocytosis Mildly elevated WBC.  Recheck CBC in morning.  No signs of infection as etiology.    DVT prophylaxis: TED hose and early ambulation for DVT prophylaxis with thrombocytopenia and low Padua score  Code Status:   Full code  Family Communication:  Diagnosis and plan discussed with patient.  She verbalized understanding agrees with plan. Further recommendations to follow as clinically indicated Disposition Plan:   Patient is from:  Home  Anticipated DC to:  Home  Anticipated DC  date:  Anticipate 2 midnight stay in the hospital  Anticipated DC barriers: No barriers to discharge identified   Admission status:  Inpatient   Claudean Severance Matson Welch MD [facility]  How to contact the [facility] Attending or Consulting provider 7A - 7P or covering provider during after hours 7P -7A, for this patient?   Check the care team in [facility] and look for a) attending/consulting [facility] provider listed and b) the [facility] team listed Log into [email or web address] and use [facility]'s universal password to access. If you do not have the password, please contact the hospital operator. Locate the [facility] provider you are looking for under [facility] and page to a number that you can be directly reached. If you still have difficulty reaching the provider, please page the [facility] (Director on Call) for the Hospitalists listed on amion for assistance.  08/05/2020, 12:05 AM

## 2020-08-06 ENCOUNTER — Other Ambulatory Visit: Payer: Self-pay

## 2020-08-06 DIAGNOSIS — M25561 Pain in right knee: Secondary | ICD-10-CM

## 2020-08-06 DIAGNOSIS — I160 Hypertensive urgency: Secondary | ICD-10-CM | POA: Diagnosis not present

## 2020-08-06 LAB — CBC
HCT: 38.9 % (ref 36.0–46.0)
Hemoglobin: 11.9 g/dL — ABNORMAL LOW (ref 12.0–15.0)
MCH: 21.6 pg — ABNORMAL LOW (ref 26.0–34.0)
MCHC: 30.6 g/dL (ref 30.0–36.0)
MCV: 70.5 fL — ABNORMAL LOW (ref 80.0–100.0)
Platelets: 319 10*3/uL (ref 150–400)
RBC: 5.52 MIL/uL — ABNORMAL HIGH (ref 3.87–5.11)
RDW: 19.2 % — ABNORMAL HIGH (ref 11.5–15.5)
WBC: 7.8 10*3/uL (ref 4.0–10.5)
nRBC: 0 % (ref 0.0–0.2)

## 2020-08-06 LAB — BASIC METABOLIC PANEL
Anion gap: 10 (ref 5–15)
BUN: 9 mg/dL (ref 6–20)
CO2: 24 mmol/L (ref 22–32)
Calcium: 9.4 mg/dL (ref 8.9–10.3)
Chloride: 103 mmol/L (ref 98–111)
Creatinine, Ser: 0.93 mg/dL (ref 0.44–1.00)
GFR, Estimated: >60 mL/min (ref >60)
Glucose, Bld: 89 mg/dL (ref 70–99)
Potassium: 3.5 mmol/L (ref 3.5–5.1)
Sodium: 137 mmol/L (ref 135–145)

## 2020-08-06 LAB — MAGNESIUM: Magnesium: 2 mg/dL (ref 1.7–2.4)

## 2020-08-06 LAB — PHOSPHORUS: Phosphorus: 5.2 mg/dL — ABNORMAL HIGH (ref 2.5–4.6)

## 2020-08-06 MED ORDER — VALSARTAN 160 MG PO TABS: 160.0000 mg | ORAL_TABLET | Freq: Every day | ORAL | 2 refills | Status: DC

## 2020-08-06 MED ORDER — AMLODIPINE BESYLATE 10 MG PO TABS: 10.0000 mg | ORAL_TABLET | Freq: Every day | ORAL | 2 refills | Status: DC

## 2020-08-06 NOTE — Discharge Instructions (Signed)
Advised to follow up PCP in one week. Advised to discontinue metoprolol due to bradycardia Advised to take amlodipine 10 mg daily and Valsartan 160 mg daily.Yvette Wilson PCP to titrate up or down BP medications for better BP Control.

## 2020-08-06 NOTE — Discharge Summary (Addendum)
Physician Discharge Summary  Yvette Wilson:[id number] DOB: 09/10/1980 DOA: 08/04/2020  PCP: [facility]  Admit date: 08/04/2020  Discharge date: 08/06/2020  Admitted From: Home.  Disposition:  Home.  Recommendations for Outpatient Follow-up:  Follow up with PCP in 1-2 weeks. Please obtain BMP/CBC in one week. 3.   Advised to discontinue metoprolol due to bradycardia. 4.   Advised to take amlodipine 10 mg daily and Valsartan 160 mg daily.. 5.   Advised PCP to titrate up or down BP medications for better BP Control.  Home Health: None Equipment/Devices:None.  Discharge Condition: Good CODE STATUS:Full code Diet recommendation: Heart Healthy   Brief Summary / Hospital Course: This 40 years old female with PMH significant for hypertension, gestational diabetes presented in the ED for the evaluation of headache and swelling of her legs. She reports she stands on her feet all day at work and denies any injury or trauma to her legs.  She denies any recent travel or prolonged immobilization.  She presented to the ED for worsening headache. She was found to have BP 266 /136, HR 50 associated with headache. Patient was given hydralazine and patient was admitted for hypertensive urgency.  Patient takes metoprolol at home.  Metoprolol was discontinued and started on Norvasc and Cozaar for blood pressure control.  CT head was unremarkable, EKG shows left ventricular hypertrophy consistent with hypertension, echocardiogram was completed showed normal LVEF, no regional wall motion abnormalities.  Blood pressure has improved with amlodipine and Cozaar.  She reports headache has improved.  She denies any nausea, vomiting, dizziness or palpitations.  She feels better and wants to be discharged.  Patient is being discharged home on amlodipine and valsartan,  advised to follow-up with primary care physician in 1 week.  Discharge Diagnoses:  Principal Problem:   Hypertensive  urgency Active Problems:   Thrombocytopenia (HCC)   Leukocytosis   Bradycardia  Discharge Instructions  Discharge Instructions     Call MD for:  difficulty breathing, headache or visual disturbances   Complete by: As directed    Call MD for:  persistant dizziness or light-headedness   Complete by: As directed    Call MD for:  persistant nausea and vomiting   Complete by: As directed    Diet - low sodium heart healthy   Complete by: As directed    Diet Carb Modified   Complete by: As directed    Discharge instructions   Complete by: As directed    Advised to follow up PCP in one week. Advised to discontinue metoprolol due to bradycardia Advised to take amlodipine 10 mg daily and Valsartan 160 mg daily.Algis Downs PCP to titrate up or down BP medications for better BP Control.   Increase activity slowly   Complete by: As directed       Allergies as of 08/06/2020       Reactions   Penicillins Rash   Has patient had a PCN reaction causing immediate rash, facial/tongue/throat swelling, SOB or lightheadedness with hypotension: YES Has patient had a PCN reaction causing severe rash involving mucus membranes or skin necrosis: NO Has patient had a PCN reaction that required hospitalization  NO Has patient had a PCN reaction occurring within the last 10 years:  NO        Medication List     STOP taking these medications    atenolol 25 MG tablet Commonly known as: TENORMIN   ibuprofen 800 MG tablet Commonly known as: ADVIL  medroxyPROGESTERone 10 MG tablet Commonly known as: PROVERA   norethindrone 0.35 MG tablet Commonly known as: MICRONOR   omeprazole 20 MG capsule Commonly known as: PRILOSEC       TAKE these medications    amLODipine 10 MG tablet Commonly known as: NORVASC Take 1 tablet (10 mg total) by mouth daily. Start taking on: August 07, 2020   valsartan 160 MG tablet Commonly known as: Diovan Take 1 tablet (160 mg total) by mouth daily.         Follow-up Information     [facility] Follow up in 1 week(s).   Specialty: General Practice Contact information: [address] [place] [address] [phone]                Allergies  Allergen Reactions   Penicillins Rash    Has patient had a PCN reaction causing immediate rash, facial/tongue/throat swelling, SOB or lightheadedness with hypotension: YES Has patient had a PCN reaction causing severe rash involving mucus membranes or skin necrosis: NO Has patient had a PCN reaction that required hospitalization  NO Has patient had a PCN reaction occurring within the last 10 years:  NO     Consultations: None   Procedures/Studies: CT Head Wo Contrast  Result Date: 08/04/2020 CLINICAL DATA:  40 year old female with headache. EXAM: CT HEAD WITHOUT CONTRAST TECHNIQUE: Contiguous axial images were obtained from the base of the skull through the vertex without intravenous contrast. COMPARISON:  Head CT dated 02/09/2010. FINDINGS: Brain: No evidence of acute infarction, hemorrhage, hydrocephalus, extra-axial collection or mass lesion/mass effect. Vascular: No hyperdense vessel or unexpected calcification. Skull: Normal. Negative for fracture or focal lesion. Sinuses/Orbits: No acute finding. Other: None IMPRESSION: Normal noncontrast CT of the brain. Electronically Signed   By: Elgie Collard M.D.   On: 08/04/2020 20:56   DG Chest Portable 1 View  Result Date: 08/04/2020 CLINICAL DATA:  Weakness.  Fatigue.  Patient reports ankle swelling. EXAM: PORTABLE CHEST 1 VIEW COMPARISON:  Radiograph 11/21/2013 FINDINGS: Enlarged cardiac silhouette may be in part accentuated by portable technique. Stable normal mediastinal contours. No pulmonary edema, focal airspace disease, large pleural effusion or pneumothorax. No acute osseous abnormalities are seen. IMPRESSION: Mild cardiomegaly, may be in part accentuated by portable technique. No acute  pulmonary process. Electronically Signed   By: Narda Rutherford M.D.   On: 08/04/2020 20:48   ECHOCARDIOGRAM COMPLETE  Result Date: 08/05/2020    ECHOCARDIOGRAM REPORT   Patient Name:   Yvette Wilson Date of Exam: 08/05/2020 Medical Rec #:  [id number]      Height:       68.0 in Accession #:    [id number]     Weight:       299.0 lb Date of Birth:  [birth year]     BSA:          2.425 m Patient Age:    39 years       BP:           153/93 mmHg Patient Gender: F              HR:           49 bpm. Exam Location:  Inpatient Procedure: 2D Echo, Cardiac Doppler, Color Doppler and 3D Echo Indications:    Dyspnea R06.00  History:        Patient has no prior history of Echocardiogram examinations.  Risk Factors:Hypertension.  Sonographer:    Leta Jungling RDCS Referring Phys: [id number] BRADLEY S CHOTINER IMPRESSIONS  1. Left ventricular ejection fraction, by estimation, is 60 to 65%. The left ventricle has normal function. The left ventricle has no regional wall motion abnormalities. Left ventricular diastolic parameters are indeterminate. Elevated left ventricular end-diastolic pressure.  2. Right ventricular systolic function is normal. The right ventricular size is normal.  3. The mitral valve is normal in structure. No evidence of mitral valve regurgitation. No evidence of mitral stenosis.  4. The aortic valve is tricuspid. Aortic valve regurgitation is not visualized. No aortic stenosis is present.  5. The inferior vena cava is normal in size with <50% respiratory variability, suggesting right atrial pressure of 8 mmHg. FINDINGS  Left Ventricle: Left ventricular ejection fraction, by estimation, is 60 to 65%. The left ventricle has normal function. The left ventricle has no regional wall motion abnormalities. The left ventricular internal cavity size was normal in size. There is  no left ventricular hypertrophy. Left ventricular diastolic parameters are indeterminate. Elevated left ventricular  end-diastolic pressure. Right Ventricle: The right ventricular size is normal. No increase in right ventricular wall thickness. Right ventricular systolic function is normal. Left Atrium: Left atrial size was normal in size. Right Atrium: Right atrial size was normal in size. Pericardium: There is no evidence of pericardial effusion. Mitral Valve: The mitral valve is normal in structure. No evidence of mitral valve regurgitation. No evidence of mitral valve stenosis. Tricuspid Valve: The tricuspid valve is normal in structure. Tricuspid valve regurgitation is not demonstrated. No evidence of tricuspid stenosis. Aortic Valve: The aortic valve is tricuspid. Aortic valve regurgitation is not visualized. No aortic stenosis is present. Pulmonic Valve: The pulmonic valve was normal in structure. Pulmonic valve regurgitation is not visualized. No evidence of pulmonic stenosis. Aorta: The aortic root is normal in size and structure. Venous: The inferior vena cava is normal in size with less than 50% respiratory variability, suggesting right atrial pressure of 8 mmHg. IAS/Shunts: No atrial level shunt detected by color flow Doppler.  LEFT VENTRICLE PLAX 2D LVIDd:         5.60 cm  Diastology LVIDs:         3.30 cm  LV e' medial:    6.19 cm/s LV PW:         1.00 cm  LV E/e' medial:  15.9 LV IVS:        1.00 cm  LV e' lateral:   11.40 cm/s LVOT diam:     2.30 cm  LV E/e' lateral: 8.6 LV SV:         77 LV SV Index:   32 LVOT Area:     4.15 cm                          3D Volume EF:                         3D EF:        61 %                         LV EDV:       178 ml                         LV ESV:       69 ml  LV SV:        109 ml RIGHT VENTRICLE RV S prime:     23.10 cm/s TAPSE (M-mode): 2.7 cm LEFT ATRIUM             Index       RIGHT ATRIUM           Index LA diam:        4.80 cm 1.98 cm/m  RA Area:     12.50 cm LA Vol (A2C):   45.7 ml 18.85 ml/m RA Volume:   25.70 ml  10.60 ml/m LA Vol (A4C):    50.8 ml 20.95 ml/m LA Biplane Vol: 48.9 ml 20.17 ml/m  AORTIC VALVE LVOT Vmax:   87.80 cm/s LVOT Vmean:  56.700 cm/s LVOT VTI:    0.185 m  AORTA Ao Root diam: 3.00 cm Ao Asc diam:  3.40 cm MITRAL VALVE MV Area (PHT): 4.21 cm    SHUNTS MV Decel Time: 180 msec    Systemic VTI:  0.18 m MV E velocity: 98.20 cm/s  Systemic Diam: 2.30 cm MV A velocity: 70.30 cm/s MV E/A ratio:  1.40 Chilton Siiffany Vinings MD Electronically signed by Chilton Siiffany Kerr MD Signature Date/Time: 08/05/2020/4:40:08 PM    Final     Echo :   Subjective: Patient was seen and examined at bedside.  Overnight events noted. She feels better. BP has improved, She still has mild headache but feels better and wants to be discharged.  Discharge Exam: Vitals:   08/05/20 2211 08/06/20 0425  BP: (!) 153/88 (!) 165/85  Pulse: 63 (!) 57  Resp: 18 15  Temp: 98.6 F (37 C) 98 F (36.7 C)  SpO2: 100% 94%   Vitals:   08/05/20 0755 08/05/20 1341 08/05/20 2211 08/06/20 0425  BP: (!) 151/82 (!) 153/93 (!) 153/88 (!) 165/85  Pulse: (!) 54 (!) 49 63 (!) 57  Resp: 16 16 18 15   Temp: 98.3 F (36.8 C) 97.8 F (36.6 C) 98.6 F (37 C) 98 F (36.7 C)  TempSrc: Oral Oral Oral Oral  SpO2: 99% 99% 100% 94%  Weight:      Height:        General: Pt is alert, awake, not in acute distress Cardiovascular: RRR, S1/S2 +, no rubs, no gallops Respiratory: CTA bilaterally, no wheezing, no rhonchi Abdominal: Soft, NT, ND, bowel sounds + Extremities: no edema, no cyanosis    The results of significant diagnostics from this hospitalization (including imaging, microbiology, ancillary and laboratory) are listed below for reference.     Microbiology: Recent Results (from the past 240 hour(s))  Resp Panel by RT-PCR (Flu A&B, Covid) Nasopharyngeal Swab     Status: None   Collection Time: 08/04/20  8:30 PM   Specimen: Nasopharyngeal Swab; Nasopharyngeal(NP) swabs in vial transport medium  Result Value Ref Range Status   SARS Coronavirus 2 by RT PCR  NEGATIVE NEGATIVE Final    Comment: (NOTE) SARS-CoV-2 target nucleic acids are NOT DETECTED.  The SARS-CoV-2 RNA is generally detectable in upper respiratory specimens during the acute phase of infection. The lowest concentration of SARS-CoV-2 viral copies this assay can detect is 138 copies/mL. A negative result does not preclude SARS-Cov-2 infection and should not be used as the sole basis for treatment or other patient management decisions. A negative result may occur with  improper specimen collection/handling, submission of specimen other than nasopharyngeal swab, presence of viral mutation(s) within the areas targeted by this assay, and inadequate number of viral copies(<138 copies/mL). A negative  result must be combined with clinical observations, patient history, and epidemiological information. The expected result is Negative.  Fact Sheet for Patients:  BloggerCourse.com  Fact Sheet for Healthcare Providers:  SeriousBroker.it  This test is no t yet approved or cleared by the [place] FDA and  has been authorized for detection and/or diagnosis of SARS-CoV-2 by FDA under an Emergency Use Authorization (EUA). This EUA will remain  in effect (meaning this test can be used) for the duration of the COVID-19 declaration under Section 564(b)(1) of the Act, 21 U.S.C.section 360bbb-3(b)(1), unless the authorization is terminated  or revoked sooner.       Influenza A by PCR NEGATIVE NEGATIVE Final   Influenza B by PCR NEGATIVE NEGATIVE Final    Comment: (NOTE) The Xpert Xpress SARS-CoV-2/FLU/RSV plus assay is intended as an aid in the diagnosis of influenza from Nasopharyngeal swab specimens and should not be used as a sole basis for treatment. Nasal washings and aspirates are unacceptable for Xpert Xpress SARS-CoV-2/FLU/RSV testing.  Fact Sheet for Patients: BloggerCourse.com  Fact Sheet for  Healthcare Providers: SeriousBroker.it  This test is not yet approved or cleared by the [place] FDA and has been authorized for detection and/or diagnosis of SARS-CoV-2 by FDA under an Emergency Use Authorization (EUA). This EUA will remain in effect (meaning this test can be used) for the duration of the COVID-19 declaration under Section 564(b)(1) of the Act, 21 U.S.C. section 360bbb-3(b)(1), unless the authorization is terminated or revoked.  Performed at [facility], [address], [place], [place] [address]      Labs: BNP (last 3 results) Recent Labs    08/05/20 0449  BNP 53.7   Basic Metabolic Panel: Recent Labs  Lab 08/04/20 1537 08/05/20 0449 08/06/20 0544  NA 142 140 137  K 4.3 3.0* 3.5  CL 107 104 103  CO2 GLUCOSE 90 96 89  BUN CREATININE 1.13* 0.84 0.93  CALCIUM 9.3 9.0 9.4  MG  --   --  2.0  PHOS  --   --  5.2*   Liver Function Tests: No results for input(s): AST, ALT, ALKPHOS, BILITOT, PROT, ALBUMIN in the last 168 hours. No results for input(s): LIPASE, AMYLASE in the last 168 hours. No results for input(s): AMMONIA in the last 168 hours. CBC: Recent Labs  Lab 08/04/20 1537 08/05/20 0449 08/06/20 0544  WBC 11.4* 10.9* 7.8  NEUTROABS 5.8  --   --   HGB 10.4* 10.6* 11.9*  HCT 34.8* 34.1* 38.9  MCV 72.0* 71.0* 70.5*  PLT 59* 331 319   Cardiac Enzymes: No results for input(s): CKTOTAL, CKMB, CKMBINDEX, TROPONINI in the last 168 hours. BNP: Invalid input(s): POCBNP CBG: No results for input(s): GLUCAP in the last 168 hours. D-Dimer No results for input(s): DDIMER in the last 72 hours. Hgb A1c No results for input(s): HGBA1C in the last 72 hours. Lipid Profile No results for input(s): CHOL, HDL, LDLCALC, TRIG, CHOLHDL, LDLDIRECT in the last 72 hours. Thyroid function studies Recent Labs    08/05/20 0449  TSH 0.805   Anemia work up No results for input(s):  VITAMINB12, FOLATE, FERRITIN, TIBC, IRON, RETICCTPCT in the last 72 hours. Urinalysis    Component Value Date/Time   COLORURINE YELLOW 05/06/2019 1415   APPEARANCEUR CLEAR 05/06/2019 1415   APPEARANCEUR Hazy 11/21/2013 1053   LABSPEC 1.015 05/06/2019 1415   LABSPEC 1.018 11/21/2013 1053   PHURINE 5.0 05/06/2019 1415   GLUCOSEU  NEGATIVE 05/06/2019 1415   GLUCOSEU Negative 11/21/2013 1053   HGBUR NEGATIVE 05/06/2019 1415   BILIRUBINUR NEGATIVE 05/06/2019 1415   BILIRUBINUR Negative 11/21/2013 1053   KETONESUR NEGATIVE 05/06/2019 1415   PROTEINUR NEGATIVE 05/06/2019 1415   UROBILINOGEN 0.2 07/08/2014 1820   NITRITE NEGATIVE 05/06/2019 1415   LEUKOCYTESUR NEGATIVE 05/06/2019 1415   LEUKOCYTESUR Trace 11/21/2013 1053   Sepsis Labs Invalid input(s): PROCALCITONIN,  WBC,  LACTICIDVEN Microbiology Recent Results (from the past 240 hour(s))  Resp Panel by RT-PCR (Flu A&B, Covid) Nasopharyngeal Swab     Status: None   Collection Time: 08/04/20  8:30 PM   Specimen: Nasopharyngeal Swab; Nasopharyngeal(NP) swabs in vial transport medium  Result Value Ref Range Status   SARS Coronavirus 2 by RT PCR NEGATIVE NEGATIVE Final    Comment: (NOTE) SARS-CoV-2 target nucleic acids are NOT DETECTED.  The SARS-CoV-2 RNA is generally detectable in upper respiratory specimens during the acute phase of infection. The lowest concentration of SARS-CoV-2 viral copies this assay can detect is 138 copies/mL. A negative result does not preclude SARS-Cov-2 infection and should not be used as the sole basis for treatment or other patient management decisions. A negative result may occur with  improper specimen collection/handling, submission of specimen other than nasopharyngeal swab, presence of viral mutation(s) within the areas targeted by this assay, and inadequate number of viral copies(<138 copies/mL). A negative result must be combined with clinical observations, patient history, and  epidemiological information. The expected result is Negative.  Fact Sheet for Patients:  BloggerCourse.com  Fact Sheet for Healthcare Providers:  SeriousBroker.it  This test is no t yet approved or cleared by the [place] FDA and  has been authorized for detection and/or diagnosis of SARS-CoV-2 by FDA under an Emergency Use Authorization (EUA). This EUA will remain  in effect (meaning this test can be used) for the duration of the COVID-19 declaration under Section 564(b)(1) of the Act, 21 U.S.C.section 360bbb-3(b)(1), unless the authorization is terminated  or revoked sooner.       Influenza A by PCR NEGATIVE NEGATIVE Final   Influenza B by PCR NEGATIVE NEGATIVE Final    Comment: (NOTE) The Xpert Xpress SARS-CoV-2/FLU/RSV plus assay is intended as an aid in the diagnosis of influenza from Nasopharyngeal swab specimens and should not be used as a sole basis for treatment. Nasal washings and aspirates are unacceptable for Xpert Xpress SARS-CoV-2/FLU/RSV testing.  Fact Sheet for Patients: BloggerCourse.com  Fact Sheet for Healthcare Providers: SeriousBroker.it  This test is not yet approved or cleared by the [place] FDA and has been authorized for detection and/or diagnosis of SARS-CoV-2 by FDA under an Emergency Use Authorization (EUA). This EUA will remain in effect (meaning this test can be used) for the duration of the COVID-19 declaration under Section 564(b)(1) of the Act, 21 U.S.C. section 360bbb-3(b)(1), unless the authorization is terminated or revoked.  Performed at [facility], [address] [place], [place] [address]      Time coordinating discharge: Over 30 minutes  SIGNED:   Cipriano Bunker, MD  [facility] 08/06/2020, 10:06 AM Pager   If 7PM-7AM, please contact night-coverage [email or web address]

## 2020-08-06 NOTE — Progress Notes (Signed)
Went over discharge papers with patient.  All questions answered.  VSS.   

## 2020-08-10 ENCOUNTER — Ambulatory Visit: Payer: Medicaid Other | Admitting: Rehabilitative and Restorative Service Providers"

## 2020-08-15 ENCOUNTER — Inpatient Hospital Stay: Admission: RE | Admit: 2020-08-15 | Payer: Medicaid Other | Source: Ambulatory Visit

## 2020-08-18 NOTE — Progress Notes (Signed)
Subjective:    Yvette Wilson - 40 y.o. female MRN [id number]  Date of birth: 1981/01/21  HPI  Yvette Wilson is to establish care and hospital discharge follow-up. Patient has a PMH significant for hypertensive urgency, anxiety attack, prediabetes, thrombocytopenia, leukocytosis, and bradycardia.   Current issues and/or concerns: Visit 08/04/2020 - 08/06/2020 at [facility] per MD note: Recommendations for Outpatient Follow-up:  Follow up with PCP in 1-2 weeks. Please obtain BMP/CBC in one week. 3.   Advised to discontinue metoprolol due to bradycardia. 4.   Advised to take amlodipine 10 mg daily and Valsartan 160 mg daily. 5.   Advised PCP to titrate up or down BP medications for better BP Control.   Home Health: None Equipment/Devices:None.   Discharge Condition: Good CODE STATUS:Full code Diet recommendation: Heart Healthy   Brief Summary / Hospital Course: This 40 years old female with PMH significant for hypertension, gestational diabetes presented in the ED for the evaluation of headache and swelling of her legs. She reports she stands on her feet all day at work and denies any injury or trauma to her legs.  She denies any recent travel or prolonged immobilization. She presented to the ED for worsening headache. She was found to have BP 266 /136, HR 50 associated with headache. Patient was given hydralazine and patient was admitted for hypertensive urgency.  Patient takes metoprolol at home.  Metoprolol was discontinued and started on Norvasc and Cozaar for blood pressure control.  CT head was unremarkable, EKG shows left ventricular hypertrophy consistent with hypertension, echocardiogram was completed showed normal LVEF, no regional wall motion abnormalities.  Blood pressure has improved with amlodipine and Cozaar.  She reports headache has improved.  She denies any nausea, vomiting, dizziness or palpitations.  She feels better and wants to be discharged.  Patient  is being discharged home on amlodipine and valsartan,  advised to follow-up with primary care physician in 1 week.   Discharge Diagnoses:  Principal Problem:   Hypertensive urgency Active Problems:   Thrombocytopenia (HCC)   Leukocytosis   Bradycardia  08/19/2020: HYPERTENSION FOLLOW-UP: Currently taking: see medication list Have you taken your blood pressure medication today: [x]  Yes []  No  Med Adherence: [x]  Yes, but sometimes forgets to take, reports she tends to remember to take medication when she takes them together. Since separating medications with morning vs nighttime she may forget. For example she forgot to take Amlodipine last evening. She took both Valsartan and Amlodipine this morning and doing well so far.  Medication side effects: [x]  Yes, feeling sick if taking medications on empty stomach, vomiting if taking medications with food  Adherence with salt restriction (low-salt diet): reports she is monitoring her diet  Home Monitoring?: [x]  Yes    []  No Monitoring Frequency: [x]  Yes    []  No Home BP results range: [x]  Yes, 150's/90's  Smoking []  Yes [x]  No SOB? []  Yes    [x]  No Chest Pain?: []  Yes    [x]  No Comments:   Reports recently she was able to speak with a pharmacist about the side effects of medications. Was told to separate medications taking one in the morning and one in the evening. She began this regimen over the weekend. She took Valsartan in the morning and felt well all day. She took Amlodipine later that evening and felt well, did not have to awaken overnight. Did feel somewhat sick the following day.   Reports history of hypertension during last pregnancy and  taking medication at that time. Reports she didn't realize she was sick prior to hospital admission because she felt well leading up to her hospital admission. Expresses concern that she needs to care for herself so that she is able to care for her 53 year-old son who is sick.   2. BACK PAIN: Left lower  back pain since beginning blood pressure medications. Rather consistent.  3. ACID REFLUX: Having acid reflux even on an empty stomach. Unsure if related to blood pressure medications. Was prescribed acid reflux in the past and was unable to pickup prescription at that time.   ROS per HPI   Health Maintenance:  Health Maintenance Due  Topic Date Due   PNEUMOCOCCAL POLYSACCHARIDE VACCINE AGE 79-64 HIGH RISK  Never done   COVID-19 Vaccine (1) Never done   FOOT EXAM  Never done   OPHTHALMOLOGY EXAM  Never done   Hepatitis C Screening  Never done   TETANUS/TDAP  Never done   HEMOGLOBIN A1C  12/21/2019    Past Medical History: Patient Active Problem List   Diagnosis Date Noted   Thrombocytopenia (HCC) 08/05/2020   Leukocytosis 08/05/2020   Bradycardia 08/05/2020   Hypertensive urgency 08/04/2020   Prediabetes 06/25/2019   Anxiety attack 07/07/2014    Social History   reports that she quit smoking about 15 years ago. Her smoking use included cigarettes. She has a 1.50 pack-year smoking history. She has never used smokeless tobacco. She reports that she does not drink alcohol and does not use drugs.   Family History  family history includes Cancer in her maternal aunt.   Medications: reviewed and updated   Objective:   Physical Exam BP (!) 145/87 (BP Location: Left Arm)   Pulse 90   Resp 16   Ht 5' 6.85" (1.698 m)   Wt 292 lb 9.6 oz (132.7 kg)   LMP 07/26/2020 (Exact Date)   SpO2 98%   BMI 46.03 kg/m  Physical Exam HENT:     Head: Normocephalic and atraumatic.  Eyes:     Extraocular Movements: Extraocular movements intact.     Conjunctiva/sclera: Conjunctivae normal.     Pupils: Pupils are equal, round, and reactive to light.  Cardiovascular:     Rate and Rhythm: Normal rate and regular rhythm.     Pulses: Normal pulses.     Heart sounds: Normal heart sounds.  Pulmonary:     Effort: Pulmonary effort is normal.     Breath sounds: Normal breath sounds.   Musculoskeletal:        General: Normal range of motion.     Cervical back: Normal range of motion and neck supple.     Comments: No evidence of tenderness on palpation of bilateral upper, bilateral lower, and midline back.  Skin:    General: Skin is warm and dry.  Neurological:     General: No focal deficit present.     Mental Status: She is alert and oriented to person, place, and time.  Psychiatric:        Mood and Affect: Mood normal.        Behavior: Behavior normal.      Assessment & Plan:  1. Encounter to establish care: - Patient presents today to establish care.  - Return for annual physical examination, labs, and health maintenance. Arrive fasting meaning having no food for at least 8 hours prior to appointment. You may have only water or black coffee. Please take scheduled medications as normal.  2. Hospital discharge follow-up:  Reviewed hospital course, current medications, ensured proper follow-up in place, and addressed concerns.   3. Hypertensive urgency: - Blood pressure not at goal during today's visit. Patient asymptomatic without chest pressure, chest pain, palpitations, shortness of breath, and worst headache of life. - Since recently separating the medications side effects seem to have lessened.  - Continue Amlodipine and Valsartan as prescribed.  - Begin Hydrochlorothiazide as prescribed.  - Counseled patient to take medications at the same time each day.  - Counseled to take Valsartan and Hydrochlorothiazide in the morning and Amlodipine in the evening. She may consider setting alarms on phone for mindfulness to take medications.  - Counseled on blood pressure goal of less than 130/80, low-sodium, DASH diet, medication compliance, 150 minutes of moderate intensity exercise per week as tolerated. Discussed medication compliance, adverse effects. - BMP to evaluate kidney function and electrolyte balance. - Follow-up with primary provider in 2 weeks or sooner if  needed.  - amLODipine (NORVASC) 10 MG tablet; Take 1 tablet (10 mg total) by mouth daily.  Dispense: 60 tablet; Refill: 0 - valsartan (DIOVAN) 160 MG tablet; Take 1 tablet (160 mg total) by mouth daily.  Dispense: 60 tablet; Refill: 0 - hydrochlorothiazide (HYDRODIURIL) 12.5 MG tablet; Take 1 tablet (12.5 mg total) by mouth daily.  Dispense: 30 tablet; Refill: 0 - Basic Metabolic Panel; Future  4. Bradycardia: - Metoprolol discontinued prior to hospital discharge related to side effect of bradycardia.  - Today in office heart rate normal.   5. Gastroesophageal reflux disease, unspecified whether esophagitis present: - Omeprazole as prescribed. You may feel better if you: ?Lose weight (if you are overweight) ?Avoid foods that make your symptoms worse - For some people these include coffee, chocolate, alcohol, peppermint, and fatty foods. ?Stop smoking, if you smoke ?Avoid late meals - Lying down with a full stomach can make reflux worse. Try to plan meals for at least 2 to 3 hours before bedtime. - Follow-up with primary provider as scheduled.  - omeprazole (PRILOSEC) 20 MG capsule; Take 1 capsule (20 mg total) by mouth daily.  Dispense: 90 capsule; Refill: 0  6. Thrombocytopenia (HCC): - CBC to screen blood count. - CBC; Future  7. Leukocytosis, unspecified type: - CBC to screen blood count.  - CBC; Future  8. Acute left-sided low back pain, unspecified whether sciatica present: - Diagnostic x-ray lumbar spine for further evaluation.  - Ultrasound renal for further evaluation.  - Urinalysis and urine culture to screen for urinary tract infection. - Follow-up with primary provider as scheduled.  - Urinalysis, Routine w reflex microscopic - Urine Culture - US Renal; Future - DG Lumbar Spine Complete; Future   Patient was given clear instructions to go to Emergency Department or return to medical center if symptoms don't improve, worsen, or new problems develop.The patient  verbalized understanding.  I discussed the assessment and treatment plan with the patient. The patient was provided an opportunity to ask questions and all were answered. The patient agreed with the plan and demonstrated an understanding of the instructions.   The patient was advised to call back or seek an in-person evaluation if the symptoms worsen or if the condition fails to improve as anticipated.    Ricky Stabs, NP 08/20/2020, 2:49 PM Primary Care at [facility]

## 2020-08-19 ENCOUNTER — Ambulatory Visit: Payer: Medicaid Other

## 2020-08-19 ENCOUNTER — Encounter: Payer: Self-pay | Admitting: Family

## 2020-08-19 ENCOUNTER — Other Ambulatory Visit: Payer: Self-pay

## 2020-08-19 ENCOUNTER — Ambulatory Visit: Payer: Medicaid Other | Admitting: Family

## 2020-08-19 ENCOUNTER — Other Ambulatory Visit: Payer: Medicaid Other

## 2020-08-19 VITALS — BP 145/87 | HR 90 | Resp 16 | Ht 66.85 in | Wt 292.6 lb

## 2020-08-19 DIAGNOSIS — D72829 Elevated white blood cell count, unspecified: Secondary | ICD-10-CM

## 2020-08-19 DIAGNOSIS — R001 Bradycardia, unspecified: Secondary | ICD-10-CM | POA: Diagnosis not present

## 2020-08-19 DIAGNOSIS — Z130 Encounter for screening for diseases of the blood and blood-forming organs and certain disorders involving the immune mechanism: Secondary | ICD-10-CM

## 2020-08-19 DIAGNOSIS — Z09 Encounter for follow-up examination after completed treatment for conditions other than malignant neoplasm: Secondary | ICD-10-CM

## 2020-08-19 DIAGNOSIS — Z7689 Persons encountering health services in other specified circumstances: Secondary | ICD-10-CM

## 2020-08-19 DIAGNOSIS — D696 Thrombocytopenia, unspecified: Secondary | ICD-10-CM

## 2020-08-19 DIAGNOSIS — I160 Hypertensive urgency: Secondary | ICD-10-CM | POA: Diagnosis not present

## 2020-08-19 DIAGNOSIS — K219 Gastro-esophageal reflux disease without esophagitis: Secondary | ICD-10-CM

## 2020-08-19 DIAGNOSIS — M545 Low back pain, unspecified: Secondary | ICD-10-CM

## 2020-08-19 MED ORDER — HYDROCHLOROTHIAZIDE 12.5 MG PO TABS: 12.5000 mg | ORAL_TABLET | Freq: Every day | ORAL | 0 refills | Status: DC

## 2020-08-19 MED ORDER — OMEPRAZOLE 20 MG PO CPDR: 20.0000 mg | DELAYED_RELEASE_CAPSULE | Freq: Every day | ORAL | 0 refills | Status: DC

## 2020-08-19 MED ORDER — VALSARTAN 160 MG PO TABS
160.0000 mg | ORAL_TABLET | Freq: Every day | ORAL | 0 refills | Status: DC
Start: 2020-08-19 — End: 2020-09-07

## 2020-08-19 MED ORDER — AMLODIPINE BESYLATE 10 MG PO TABS: 10.0000 mg | ORAL_TABLET | Freq: Every day | ORAL | 0 refills | Status: DC

## 2020-08-19 NOTE — Progress Notes (Signed)
Pt presents to establish care and hospital f/u, pt states that her medication is making sick if she takes on empty stomach feels shaky and when she takes with food she vomits not sure which med is causing

## 2020-08-19 NOTE — Patient Instructions (Signed)

## 2020-08-20 ENCOUNTER — Encounter: Payer: Self-pay | Admitting: Family

## 2020-08-20 LAB — CBC
Hematocrit: 35.2 % (ref 34.0–46.6)
Hemoglobin: 10.7 g/dL — ABNORMAL LOW (ref 11.1–15.9)
MCH: 21.5 pg — ABNORMAL LOW (ref 26.6–33.0)
MCHC: 30.4 g/dL — ABNORMAL LOW (ref 31.5–35.7)
MCV: 71 fL — ABNORMAL LOW (ref 79–97)
Platelets: 411 10*3/uL (ref 150–450)
RBC: 4.97 x10E6/uL (ref 3.77–5.28)
RDW: 17.7 % — ABNORMAL HIGH (ref 11.7–15.4)
WBC: 9.2 10*3/uL (ref 3.4–10.8)

## 2020-08-20 LAB — MICROSCOPIC EXAMINATION
Casts: NONE SEEN /lpf
WBC, UA: NONE SEEN /hpf (ref 0–5)

## 2020-08-20 LAB — URINALYSIS, ROUTINE W REFLEX MICROSCOPIC
Bilirubin, UA: NEGATIVE
Glucose, UA: NEGATIVE
Ketones, UA: NEGATIVE
Nitrite, UA: NEGATIVE
Protein,UA: NEGATIVE
RBC, UA: NEGATIVE
Specific Gravity, UA: 1.019 (ref 1.005–1.030)
Urobilinogen, Ur: 0.2 mg/dL (ref 0.2–1.0)
pH, UA: 5.5 (ref 5.0–7.5)

## 2020-08-20 LAB — BASIC METABOLIC PANEL
BUN/Creatinine Ratio: 10 (ref 9–23)
BUN: 10 mg/dL (ref 6–20)
CO2: 23 mmol/L (ref 20–29)
Calcium: 9 mg/dL (ref 8.7–10.2)
Chloride: 100 mmol/L (ref 96–106)
Creatinine, Ser: 0.97 mg/dL (ref 0.57–1.00)
Glucose: 88 mg/dL (ref 65–99)
Potassium: 4.3 mmol/L (ref 3.5–5.2)
Sodium: 138 mmol/L (ref 134–144)
eGFR: 76 mL/min/{1.73_m2} (ref >59)

## 2020-08-20 NOTE — Progress Notes (Signed)
Kidney function normal.   Adding iron panel to further screen for iron deficiency anemia.   Platelets are higher than normal but improved since 2 weeks ago. Higher than normal platelets may be related to possible iron deficiency anemia. Platelets check for blood clotting. Monitor for any abnormal bruising and bleeding.

## 2020-08-20 NOTE — Progress Notes (Signed)
Urinalysis negative for urinary tract infection.

## 2020-08-21 ENCOUNTER — Other Ambulatory Visit: Payer: Medicaid Other

## 2020-08-21 NOTE — Progress Notes (Signed)
We will need to recollect patient's Iron Panel. Please call our office to reschedule lab only visit.

## 2020-08-22 ENCOUNTER — Other Ambulatory Visit: Payer: Self-pay | Admitting: Family

## 2020-08-22 DIAGNOSIS — D509 Iron deficiency anemia, unspecified: Secondary | ICD-10-CM

## 2020-08-22 LAB — IRON,TIBC AND FERRITIN PANEL
Ferritin: 19 ng/mL (ref 15–150)
Iron Saturation: 6 % — CL (ref 15–55)
Iron: 25 ug/dL — ABNORMAL LOW (ref 27–159)
Total Iron Binding Capacity: 397 ug/dL (ref 250–450)
UIBC: 372 ug/dL (ref 131–425)

## 2020-08-22 LAB — SPECIMEN STATUS REPORT

## 2020-08-22 LAB — URINE CULTURE

## 2020-08-22 MED ORDER — IRON (FERROUS SULFATE) 325 (65 FE) MG PO TABS: 325.0000 mg | ORAL_TABLET | ORAL | 0 refills | Status: DC

## 2020-08-22 NOTE — Progress Notes (Signed)
Iron panel shows iron deficiency anemia. This means you have less iron in the body than expected. Increase iron-rich foods such as dark green leafy vegetables and dried fruit such as raisins and apricots.   Also, begin Ferrous Sulfate 1 tablet three times weekly on a Monday, Wednesday, Friday schedule. Encouraged to recheck anemia at lab only visit in 6 weeks, please call our office to schedule.

## 2020-08-22 NOTE — Progress Notes (Signed)
Urine culture normal.

## 2020-09-01 NOTE — Progress Notes (Deleted)
Virtual Visit via Telephone Note  I connected with Yvette Wilson, on 09/01/2020 at 8:15 AM by telephone due to the COVID-19 pandemic and verified that I am speaking with the correct person using two identifiers.  Due to current restrictions/limitations of in-office visits due to the COVID-19 pandemic, this scheduled clinical appointment was converted to a telehealth visit.   Consent: I discussed the limitations, risks, security and privacy concerns of performing an evaluation and management service by telephone and the availability of in person appointments. I also discussed with the patient that there may be a patient responsible charge related to this service. The patient expressed understanding and agreed to proceed.   Location of Patient: Home  Location of Provider: [facility]   Persons participating in Telemedicine visit: Arelia Sneddon, NP Margorie John, CMA   History of Present Illness: Yvette Wilson is a 40 year-old female who presents for hypertension follow-up.  HYPERTENSION FOLLOW-UP: 08/19/2020: - Continue Amlodipine and Valsartan as prescribed. - Begin Hydrochlorothiazide as prescribed.  - Counseled patient to take medications at the same time each day. - Counseled to take Valsartan and Hydrochlorothiazide in the morning and Amlodipine in the evening. She may consider setting alarms on phone for mindfulness to take medications. - Follow-up with primary provider in 2 weeks or sooner if needed.   09/02/2020:  Past Medical History:  Diagnosis Date   Gestational diabetes    Gestational, diet treated   Gestational hypertension 11/09/2015   History of pre-eclampsia 1999   birth of first child   Hypertension    with all pregnancies, and then continued after last pregnancy   Sickle cell trait (HCC)    Allergies  Allergen Reactions   Penicillins Rash    Has patient had a PCN reaction causing immediate rash,  facial/tongue/throat swelling, SOB or lightheadedness with hypotension: YES Has patient had a PCN reaction causing severe rash involving mucus membranes or skin necrosis: NO Has patient had a PCN reaction that required hospitalization  NO Has patient had a PCN reaction occurring within the last 10 years:  NO     Current Outpatient Medications on File Prior to Visit  Medication Sig Dispense Refill   amLODipine (NORVASC) 10 MG tablet Take 1 tablet (10 mg total) by mouth daily. 60 tablet 0   hydrochlorothiazide (HYDRODIURIL) 12.5 MG tablet Take 1 tablet (12.5 mg total) by mouth daily. 30 tablet 0   Iron, Ferrous Sulfate, 325 (65 Fe) MG TABS Take 325 mg by mouth 3 (three) times a week. Take 325 mg (1 tablet total) on Monday, Wednesday, and Friday. 50 tablet 0   omeprazole (PRILOSEC) 20 MG capsule Take 1 capsule (20 mg total) by mouth daily. 90 capsule 0   valsartan (DIOVAN) 160 MG tablet Take 1 tablet (160 mg total) by mouth daily. 60 tablet 0   No current facility-administered medications on file prior to visit.    Observations/Objective: Alert and oriented x 3. Not in acute distress. Physical examination not completed as this is a telemedicine visit.  Assessment and Plan: ***  Follow Up Instructions: ***   Patient was given clear instructions to go to Emergency Department or return to medical center if symptoms don't improve, worsen, or new problems develop.The patient verbalized understanding.  I discussed the assessment and treatment plan with the patient. The patient was provided an opportunity to ask questions and all were answered. The patient agreed with the plan and demonstrated an understanding of  the instructions.   The patient was advised to call back or seek an in-person evaluation if the symptoms worsen or if the condition fails to improve as anticipated.     I provided *** minutes total of non-face-to-face time during this encounter.   Rema Fendt, NP  [facility] Primary Care at [facility] [place], [place] [phone] 09/01/2020, 8:15 AM

## 2020-09-02 ENCOUNTER — Telehealth: Payer: Medicaid Other | Admitting: Family

## 2020-09-02 ENCOUNTER — Encounter: Payer: Medicaid Other | Admitting: Family

## 2020-09-02 ENCOUNTER — Other Ambulatory Visit: Payer: Self-pay

## 2020-09-02 NOTE — Progress Notes (Signed)
Pt contacted wanted to reschedule was at work

## 2020-09-05 DIAGNOSIS — I10 Essential (primary) hypertension: Secondary | ICD-10-CM | POA: Insufficient documentation

## 2020-09-05 NOTE — Progress Notes (Signed)
Virtual Visit via Telephone Note  I connected with Yvette Wilson, on 09/07/2020 at 7:45 AM by telephone due to the COVID-19 pandemic and verified that I am speaking with the correct person using two identifiers.  Due to current restrictions/limitations of in-office visits due to the COVID-19 pandemic, this scheduled clinical appointment was converted to a telehealth visit.   Consent: I discussed the limitations, risks, security and privacy concerns of performing an evaluation and management service by telephone and the availability of in person appointments. I also discussed with the patient that there may be a patient responsible charge related to this service. The patient expressed understanding and agreed to proceed.   Location of Patient: Home  Location of Provider: [facility] at [facility]   Persons participating in Telemedicine visit: Arelia Sneddon, NP Margorie John, CMA   History of Present Illness: Yvette Wilson. Cannady is a 40 year-old female who presents for hypertension follow-up.   HYPERTENSION FOLLOW-UP: 08/19/2020: - Since recently separating the medications side effects seem to have lessened.  - Continue Amlodipine and Valsartan as prescribed. - Begin Hydrochlorothiazide as prescribed. - Counseled patient to take medications at the same time each day. - Counseled to take Valsartan and Hydrochlorothiazide in the morning and Amlodipine in the evening. She may consider setting alarms on phone for mindfulness to take medications. - Follow-up with primary provider in 2 weeks or sooner if needed.   09/07/2020: Blood pressures are 140's/70's. Taking medications consistently. Taking Valsartan in the morning. Taking Amlodipine and Hydrochlorothiazide at night.   2. BACK PAIN FOLLOW-UP: 08/19/2020: - Diagnostic x-ray lumbar spine for further evaluation. - Ultrasound renal for further evaluation. - Urinalysis and urine culture to screen for urinary  tract infection. - Follow-up with primary provider as scheduled  09/07/2020: Recently seen at [facility]. Provider at that location prescribed patient Ciprofloxacin for possible urinary tract infection. Today patient reports back pain has improved but still having lingering aching discomfort. Has not had a chance to get back x-ray as of present, plans to come in to our office to have this completed soon.   3. ANEMIA FOLLOW-UP: Doing well on iron supplement. Initially had some constipation but bowel movements are getting back to normal.     Past Medical History:  Diagnosis Date   Gestational diabetes    Gestational, diet treated   Gestational hypertension 11/09/2015   History of pre-eclampsia 1999   birth of first child   Hypertension    with all pregnancies, and then continued after last pregnancy   Sickle cell trait (HCC)    Allergies  Allergen Reactions   Penicillins Rash    Has patient had a PCN reaction causing immediate rash, facial/tongue/throat swelling, SOB or lightheadedness with hypotension: YES Has patient had a PCN reaction causing severe rash involving mucus membranes or skin necrosis: NO Has patient had a PCN reaction that required hospitalization  NO Has patient had a PCN reaction occurring within the last 10 years:  NO     Current Outpatient Medications on File Prior to Visit  Medication Sig Dispense Refill   amLODipine (NORVASC) 10 MG tablet Take 1 tablet (10 mg total) by mouth daily. 60 tablet 0   hydrochlorothiazide (HYDRODIURIL) 12.5 MG tablet Take 1 tablet (12.5 mg total) by mouth daily. 30 tablet 0   Iron, Ferrous Sulfate, 325 (65 Fe) MG TABS Take 325 mg by mouth 3 (three) times a week. Take 325 mg (1 tablet total) on  Monday, Wednesday, and Friday. 50 tablet 0   omeprazole (PRILOSEC) 20 MG capsule Take 1 capsule (20 mg total) by mouth daily. 90 capsule 0   valsartan (DIOVAN) 160 MG tablet Take 1 tablet (160 mg total) by mouth daily. 60 tablet 0    No current facility-administered medications on file prior to visit.    Observations/Objective: Alert and oriented x 3. Not in acute distress. Physical examination not completed as this is a telemedicine visit.  Assessment and Plan: 1. Essential hypertension: - Home blood pressures are above goal at 140's/70's.  - Continue Valsartan and Amlodipine as prescribed.  - Increase Hydrochlorothiazide from 12.5 mg daily to 25 mg daily.  - Counseled on blood pressure goal of less than 130/80, low-sodium, DASH diet, medication compliance, 150 minutes of moderate intensity exercise per week as tolerated. Discussed medication compliance, adverse effects. - Follow-up with primary provider in 2 weeks or sooner if needed.  - valsartan (DIOVAN) 160 MG tablet; Take 1 tablet (160 mg total) by mouth daily.  Dispense: 90 tablet; Refill: 0 - amLODipine (NORVASC) 10 MG tablet; Take 1 tablet (10 mg total) by mouth daily.  Dispense: 90 tablet; Refill: 0 - hydrochlorothiazide (HYDRODIURIL) 25 MG tablet; Take 1 tablet (25 mg total) by mouth daily.  Dispense: 90 tablet; Refill: 0  2. Acute left-sided low back pain, unspecified whether sciatica present: - Plan to complete diagnostic x-ray lumbar spine at next in-person visit for hypertension follow-up.  3. Iron deficiency anemia, unspecified iron deficiency anemia type: - Continue iron ferrous sulfate as prescribed.  - Docusate sodium as prescribed for constipation related to iron supplement.  - Will recheck anemia levels at annual physical exam.  - Follow-up with primary provider as scheduled.  - docusate sodium (COLACE) 100 MG capsule; Take 1 capsule (100 mg total) by mouth 2 (two) times daily as needed for mild constipation.  Dispense: 60 capsule; Refill: 0  Follow Up Instructions: Follow-up with primary provider in 2 weeks for hypertension follow-up.   Patient was given clear instructions to go to Emergency Department or return to medical center if  symptoms don't improve, worsen, or new problems develop.The patient verbalized understanding.  I discussed the assessment and treatment plan with the patient. The patient was provided an opportunity to ask questions and all were answered. The patient agreed with the plan and demonstrated an understanding of the instructions.   The patient was advised to call back or seek an in-person evaluation if the symptoms worsen or if the condition fails to improve as anticipated.   I provided 10 minutes total of non-face-to-face time during this encounter.   Rema Fendt, NP  [facility] at [facility] [place], [place] [phone] 09/07/2020, 7:45 AM

## 2020-09-07 ENCOUNTER — Telehealth (INDEPENDENT_AMBULATORY_CARE_PROVIDER_SITE_OTHER): Payer: Medicaid Other | Admitting: Family

## 2020-09-07 ENCOUNTER — Other Ambulatory Visit: Payer: Self-pay

## 2020-09-07 DIAGNOSIS — I10 Essential (primary) hypertension: Secondary | ICD-10-CM

## 2020-09-07 DIAGNOSIS — M545 Low back pain, unspecified: Secondary | ICD-10-CM | POA: Diagnosis not present

## 2020-09-07 DIAGNOSIS — D509 Iron deficiency anemia, unspecified: Secondary | ICD-10-CM | POA: Diagnosis not present

## 2020-09-07 MED ORDER — VALSARTAN 160 MG PO TABS
160.0000 mg | ORAL_TABLET | Freq: Every day | ORAL | 0 refills | Status: DC
Start: 2020-09-07 — End: 2021-04-30

## 2020-09-07 MED ORDER — HYDROCHLOROTHIAZIDE 25 MG PO TABS: 25.0000 mg | ORAL_TABLET | Freq: Every day | ORAL | 0 refills | Status: DC

## 2020-09-07 MED ORDER — AMLODIPINE BESYLATE 10 MG PO TABS: 10.0000 mg | ORAL_TABLET | Freq: Every day | ORAL | 0 refills | Status: DC

## 2020-09-07 MED ORDER — DOCUSATE SODIUM 100 MG PO CAPS: 100.0000 mg | ORAL_CAPSULE | Freq: Two times a day (BID) | ORAL | 0 refills | Status: DC | PRN

## 2020-09-09 ENCOUNTER — Emergency Department (HOSPITAL_COMMUNITY): Payer: Medicaid Other

## 2020-09-09 ENCOUNTER — Other Ambulatory Visit: Payer: Self-pay

## 2020-09-09 ENCOUNTER — Emergency Department (HOSPITAL_COMMUNITY)
Admission: EM | Admit: 2020-09-09 | Discharge: 2020-09-09 | Disposition: A | Discharge status: Home / Self Care | Payer: Medicaid Other | Attending: Emergency Medicine | Admitting: Emergency Medicine

## 2020-09-09 DIAGNOSIS — M79645 Pain in left finger(s): Secondary | ICD-10-CM | POA: Diagnosis present

## 2020-09-09 DIAGNOSIS — Z87891 Personal history of nicotine dependence: Secondary | ICD-10-CM | POA: Insufficient documentation

## 2020-09-09 DIAGNOSIS — I10 Essential (primary) hypertension: Secondary | ICD-10-CM | POA: Diagnosis not present

## 2020-09-09 DIAGNOSIS — Z79899 Other long term (current) drug therapy: Secondary | ICD-10-CM | POA: Insufficient documentation

## 2020-09-09 MED ORDER — OXYCODONE-ACETAMINOPHEN 5-325 MG PO TABS: 1.0000 | ORAL_TABLET | ORAL | 0 refills | Status: DC | PRN

## 2020-09-09 MED ORDER — ACETAMINOPHEN 500 MG PO TABS
1000.0000 mg | ORAL_TABLET | Freq: Once | ORAL | Status: AC
  Administered 2020-09-09: 1000 mg via ORAL
  Filled 2020-09-09: qty 2

## 2020-09-09 MED ORDER — METHYLPREDNISOLONE 4 MG PO TBPK: ORAL_TABLET | ORAL | 0 refills | Status: DC

## 2020-09-09 MED ORDER — OXYCODONE-ACETAMINOPHEN 5-325 MG PO TABS
1.0000 | ORAL_TABLET | Freq: Once | ORAL | Status: AC
  Administered 2020-09-09: 1 via ORAL
  Filled 2020-09-09: qty 1

## 2020-09-09 NOTE — ED Provider Notes (Signed)
[facility] EMERGENCY DEPARTMENT Provider Note   CSN: [id number] Arrival date & time: 09/09/20  0411     History Chief Complaint  Patient presents with   thumb pain    Yvette Wilson is a 41 y.o. female.  HPI Patient has a very specific area of pain on the left thumb.  She is right-hand dominant.  There is pain at the interphalangeal joint to the toward the ulnar side.  Patient reports she had a minimal discomfort starting Monday, 3 days ago.  It was not something she felt that she needed any pain medication for.  Last night however it awakened her from sleep and is severely painful.  She reports now any motion at the interphalangeal joint is severely painful.  It is also very painful if she has her hand in a dependent position.  Pain is made worse by extending the thumb.  There was no traumatic injury.  Patient works as a Museum/gallery conservator.  She worked over the weekend and went to work as well on Monday and Tuesday.  Pain was not limiting her activities.  She denies any injury.  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  He handles animals regularly.  She has not been bitten or injured in any way. No  Associated symptoms of fever chills or malaise.  No prior problems with joint dysfunction or pain or swelling.    Past Medical History:  Diagnosis Date   Gestational diabetes    Gestational, diet treated   Gestational hypertension 11/09/2015   History of pre-eclampsia 1999   birth of first child   Hypertension    with all pregnancies, and then continued after last pregnancy   Sickle cell trait [facility])     Patient Active Problem List   Diagnosis Date Noted   Essential hypertension 09/05/2020   Iron deficiency anemia 08/22/2020   Thrombocytopenia (HCC) 08/05/2020   Leukocytosis 08/05/2020   Bradycardia 08/05/2020   Hypertensive urgency 08/04/2020   Prediabetes 06/25/2019   Anxiety attack  07/07/2014    Past Surgical History:  Procedure Laterality Date   APPENDECTOMY     Dec 2007   CESAREAN SECTION     May 2008, breech   CESAREAN SECTION WITH BILATERAL TUBAL LIGATION N/A 12/17/2015   Procedure: CESAREAN SECTION WITH BILATERAL TUBAL LIGATION;  Surgeon: Vena Austria, MD;  Location: [facility] ORS;  Service: Obstetrics;  Laterality: N/A;     OB History     Gravida  4   Para  4   Term  4   Preterm      AB      Living  4      SAB      IAB      Ectopic      Multiple  0   Live Births  1        Obstetric Comments  SVD x 2 and then c/s x 2 with BTL with last c/s         Family History  Problem Relation Age of Onset   Cancer Maternal Aunt     Social History   Tobacco Use   Smoking status: Former    Packs/day: 0.50    Years: 3.00    Pack years: 1.50    Types: Cigarettes    Quit date: 02/26/2005    Years since quitting: 15.5   Smokeless tobacco: Never  Vaping Use   Vaping  Use: Never used  Substance Use Topics   Alcohol use: No   Drug use: No    Home Medications Prior to Admission medications   Medication Sig Start Date End Date Taking? Authorizing Provider  methylPREDNISolone (MEDROL DOSEPAK) 4 MG TBPK tablet Take per Dosepak instructions. 09/09/20  Yes Arby Barrette, MD  oxyCODONE-acetaminophen (PERCOCET) 5-325 MG tablet Take 1-2 tablets by mouth every 4 (four) hours as needed. 09/09/20  Yes Arby Barrette, MD  amLODipine (NORVASC) 10 MG tablet Take 1 tablet (10 mg total) by mouth daily. 09/07/20 12/06/20  Rema Fendt, NP  docusate sodium (COLACE) 100 MG capsule Take 1 capsule (100 mg total) by mouth 2 (two) times daily as needed for mild constipation. 09/07/20   Rema Fendt, NP  hydrochlorothiazide (HYDRODIURIL) 25 MG tablet Take 1 tablet (25 mg total) by mouth daily. 09/07/20 12/06/20  Rema Fendt, NP  Iron, Ferrous Sulfate, 325 (65 Fe) MG TABS Take 325 mg by mouth 3 (three) times a week. Take 325 mg (1 tablet total) on  Monday, Wednesday, and Friday. 08/24/20 12/22/20  Rema Fendt, NP  omeprazole (PRILOSEC) 20 MG capsule Take 1 capsule (20 mg total) by mouth daily. 08/19/20 11/17/20  Rema Fendt, NP  valsartan (DIOVAN) 160 MG tablet Take 1 tablet (160 mg total) by mouth daily. 09/07/20 12/06/20  Rema Fendt, NP    Allergies    Penicillins  Review of Systems   Review of Systems Constitutional no fever no chills no malaise GI: No abdominal pain nausea or vomiting Musculoskeletal: No other joint inflammations pain or muscle pain Physical Exam Updated Vital Signs BP 128/90   Pulse 62   Temp 98.2 F (36.8 C) (Oral)   Resp 20   Ht 5\' 6"  (1.676 m)   Wt 132.7 kg   SpO2 100%   BMI 47.22 kg/m   Physical Exam Constitutional:      Comments: Alert nontoxic clinically well in appearance.  HENT:     Mouth/Throat:     Pharynx: Oropharynx is clear.  Eyes:     Extraocular Movements: Extraocular movements intact.  Cardiovascular:     Rate and Rhythm: Normal rate and regular rhythm.  Pulmonary:     Effort: Pulmonary effort is normal.     Breath sounds: Normal breath sounds.  Musculoskeletal:     Comments: Appearance of both hands and forearms are normal.  Subtle appearance of swelling on the left thumb at the interphalangeal joint on the dorsal surface toward the ulnar side.  No erythema or warmth.  Exquisite pain with palpation over this area.  Exquisite pain with range of motion at the interphalangeal joint.  No pain to palpation over the metacarpal joint however pain is elicited by extension of the metacarpophalangeal joint.  No pain to compression of palpation over the body of the hand.  No pain to palpation over the wrist.  Forearm is soft and nontender no streaking or erythema.  Radial pulse 2+ and strong.  Skin:    General: Skin is warm and dry.  Neurological:     General: No focal deficit present.     Mental Status: She is oriented to person, place, and time.  Psychiatric:        Mood and  Affect: Mood normal.       ED Results / Procedures / Treatments   Labs (all labs ordered are listed, but only abnormal results are displayed) Labs Reviewed - No data to display  EKG None  Radiology DG Hand Complete Left  Result Date: 09/09/2020 CLINICAL DATA:  Left thumb pain and swelling.  No known injury. EXAM: LEFT HAND - COMPLETE 3+ VIEW COMPARISON:  None. FINDINGS: There is no evidence of fracture or dislocation. There is no evidence of arthropathy or other focal bone abnormality. Soft tissues are unremarkable. IMPRESSION: Negative. Electronically Signed   By: Signa Kell M.D.   On: 09/09/2020 06:15    Procedures Procedures   Medications Ordered in ED Medications  oxyCODONE-acetaminophen (PERCOCET/ROXICET) 5-325 MG per tablet 1 tablet (1 tablet Oral Given 09/09/20 0419)  acetaminophen (TYLENOL) tablet 1,000 mg (1,000 mg Oral Given 09/09/20 8101)    ED Course  I have reviewed the triage vital signs and the nursing notes.  Pertinent labs & imaging results that were available during my care of the patient were reviewed by me and considered in my medical decision making (see chart for details).    MDM Rules/Calculators/A&P                           Patient has an exquisitely tender interphalangeal joint of the left thumb.  No trauma.  No evidence at this time of infection.  Pain is quite localized to the dorsal surface and mostly to the ulnar aspect.  This is an isolated joint pain.  Patient does not have other joints involved or other constitutional symptoms.  Consult: Reviewed with Dr. Orlan Leavens, recommendations for icing elevating okay with Medrol Dosepak and pain medication will see in the office for recheck.  Careful return precautions reviewed. Final Clinical Impression(s) / ED Diagnoses Final diagnoses:  Pain of left thumb    Rx / DC Orders ED Discharge Orders          Ordered    methylPREDNISolone (MEDROL DOSEPAK) 4 MG TBPK tablet        09/09/20 1007     oxyCODONE-acetaminophen (PERCOCET) 5-325 MG tablet  Every 4 hours PRN        09/09/20 1007             Arby Barrette, MD 09/09/20 1013

## 2020-09-09 NOTE — Discharge Instructions (Signed)
1.  Start the Medrol Dosepak as prescribed.  May take 1-2 Vicodin every 6 hours for pain control.  If you choose to take acetaminophen (Tylenol), you may take extra strength Tylenol every 6 hours.  Vicodin contains Tylenol as well.  Each Vicodin tablet contains the equivalent of 1 regular strength, 325 mg Tylenol tablet.  If you choose, you may take 1 Vicodin tablet and 1 plain acetaminophen tablet every 6 hours. 2.  Elevate your thumb is much as possible, apply a well wrapped ice pack.  Try to wear a splint. 3.  Call Dr. Bari Edward office to get your follow-up scheduled as soon as possible for recheck. 4.  Return to the emergency department if you are developing redness or swelling that is moving into the rest of the thumb.

## 2020-09-09 NOTE — Progress Notes (Signed)
Orthopedic Tech Progress Note Patient Details:  Yvette Wilson 1980-04-11 [id number]  After applying THUMB SPICA SPLINT VELCRO I was called back by RN because splint was uncomfortable so I applied a finger splint. Much better for patient  Ortho Devices Type of Ortho Device: Thumb velcro splint, Finger splint Ortho Device/Splint Location: LUE Ortho Device/Splint Interventions: Ordered, Application, Adjustment, Removal   Post Interventions Patient Tolerated: Poor, Well Instructions Provided: Care of device  Donald Pore 09/09/2020, 10:37 AM

## 2020-09-09 NOTE — ED Triage Notes (Signed)
Left thumb pain x 2 days. Denies any recent trauma. Also reports left thumb was swollen.
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
## 2020-09-10 ENCOUNTER — Emergency Department (HOSPITAL_COMMUNITY)
Admission: EM | Admit: 2020-09-10 | Discharge: 2020-09-10 | Disposition: A | Discharge status: Home / Self Care | Payer: Medicaid Other | Attending: Emergency Medicine | Admitting: Emergency Medicine

## 2020-09-10 ENCOUNTER — Other Ambulatory Visit: Payer: Self-pay

## 2020-09-10 ENCOUNTER — Encounter (HOSPITAL_COMMUNITY): Payer: Self-pay | Admitting: *Deleted

## 2020-09-10 DIAGNOSIS — I10 Essential (primary) hypertension: Secondary | ICD-10-CM | POA: Insufficient documentation

## 2020-09-10 DIAGNOSIS — M79645 Pain in left finger(s): Secondary | ICD-10-CM | POA: Diagnosis present

## 2020-09-10 DIAGNOSIS — Z87891 Personal history of nicotine dependence: Secondary | ICD-10-CM | POA: Diagnosis not present

## 2020-09-10 DIAGNOSIS — Z79899 Other long term (current) drug therapy: Secondary | ICD-10-CM | POA: Diagnosis not present

## 2020-09-10 NOTE — ED Provider Notes (Signed)
[facility], [place] EMERGENCY DEPARTMENT Provider Note   CSN: [id number] Arrival date & time: 09/10/20  1331     History Chief Complaint  Patient presents with   Hand Pain    Yvette Wilson is a 40 y.o. female.  HPI  Patient with no significant medical history presents to the emergency department with chief complaint of left thumb pain.  Patient states pain started on Monday, states she feels pain in the PIP joint, she denies  recent trauma to the area, has never had this in the past, states she has swelling but denies redness, paresthesias states that it hurts when she moves it.  She has no history of IV drug use, no recent blood transfusions, she does not have HIV, she denies systemic rash, joint pain, recent bug bites.  She was recently seen here yesterday, for similar complaint, she had a benign work-up, she was started on steroids, narcotics, and actually has an appoint with Dr. Stark Jock of hand surgery next week.  She is here today because she states it still has pain and has some increased swelling.  She has no other complaints at this time.  She does not endorse fevers, chills, chest pain, shortness of breath, abdominal pain.  Past Medical History:  Diagnosis Date   Gestational diabetes    Gestational, diet treated   Gestational hypertension 11/09/2015   History of pre-eclampsia 1999   birth of first child   Hypertension    with all pregnancies, and then continued after last pregnancy   Sickle cell trait [facility])     Patient Active Problem List   Diagnosis Date Noted   Essential hypertension 09/05/2020   Iron deficiency anemia 08/22/2020   Thrombocytopenia (HCC) 08/05/2020   Leukocytosis 08/05/2020   Bradycardia 08/05/2020   Hypertensive urgency 08/04/2020   Prediabetes 06/25/2019   Anxiety attack 07/07/2014    Past Surgical History:  Procedure Laterality Date   APPENDECTOMY     Dec 2007   CESAREAN SECTION     May 2008, breech   CESAREAN SECTION WITH  BILATERAL TUBAL LIGATION N/A 12/17/2015   Procedure: CESAREAN SECTION WITH BILATERAL TUBAL LIGATION;  Surgeon: Vena Austria, MD;  Location: [facility] ORS;  Service: Obstetrics;  Laterality: N/A;     OB History     Gravida  4   Para  4   Term  4   Preterm      AB      Living  4      SAB      IAB      Ectopic      Multiple  0   Live Births  1        Obstetric Comments  SVD x 2 and then c/s x 2 with BTL with last c/s         Family History  Problem Relation Age of Onset   Cancer Maternal Aunt     Social History   Tobacco Use   Smoking status: Former    Packs/day: 0.50    Years: 3.00    Pack years: 1.50    Types: Cigarettes    Quit date: 02/26/2005    Years since quitting: 15.5   Smokeless tobacco: Never  Vaping Use   Vaping Use: Never used  Substance Use Topics   Alcohol use: No   Drug use: No    Home Medications Prior to Admission medications   Medication Sig Start Date End Date Taking? Authorizing Provider  amLODipine (NORVASC)  10 MG tablet Take 1 tablet (10 mg total) by mouth daily. 09/07/20 12/06/20  Rema Fendt, NP  docusate sodium (COLACE) 100 MG capsule Take 1 capsule (100 mg total) by mouth 2 (two) times daily as needed for mild constipation. 09/07/20   Rema Fendt, NP  hydrochlorothiazide (HYDRODIURIL) 25 MG tablet Take 1 tablet (25 mg total) by mouth daily. 09/07/20 12/06/20  Rema Fendt, NP  Iron, Ferrous Sulfate, 325 (65 Fe) MG TABS Take 325 mg by mouth 3 (three) times a week. Take 325 mg (1 tablet total) on Monday, Wednesday, and Friday. 08/24/20 12/22/20  Rema Fendt, NP  methylPREDNISolone (MEDROL DOSEPAK) 4 MG TBPK tablet Take per Dosepak instructions. 09/09/20   Arby Barrette, MD  omeprazole (PRILOSEC) 20 MG capsule Take 1 capsule (20 mg total) by mouth daily. 08/19/20 11/17/20  Rema Fendt, NP  oxyCODONE-acetaminophen (PERCOCET) 5-325 MG tablet Take 1-2 tablets by mouth every 4 (four) hours as needed. 09/09/20   Arby Barrette, MD  valsartan (DIOVAN) 160 MG tablet Take 1 tablet (160 mg total) by mouth daily. 09/07/20 12/06/20  Rema Fendt, NP    Allergies    Penicillins  Review of Systems   Review of Systems  Constitutional:  Negative for chills and fever.  HENT:  Negative for congestion.   Respiratory:  Negative for shortness of breath.   Cardiovascular:  Negative for chest pain.  Gastrointestinal:  Negative for abdominal pain.  Genitourinary:  Negative for enuresis.  Musculoskeletal:  Negative for back pain.       Left thumb pain.  Skin:  Negative for rash.  Neurological:  Negative for dizziness.  Hematological:  Does not bruise/bleed easily.   Physical Exam Updated Vital Signs BP 133/84 (BP Location: Right Arm)   Pulse 77   Temp 98.5 F (36.9 C) (Oral)   Resp 20   SpO2 99%   Physical Exam Vitals and nursing note reviewed.  Constitutional:      General: She is not in acute distress.    Appearance: She is not ill-appearing.  HENT:     Head: Normocephalic and atraumatic.     Nose: No congestion.  Eyes:     Conjunctiva/sclera: Conjunctivae normal.  Cardiovascular:     Rate and Rhythm: Normal rate and regular rhythm.     Pulses: Normal pulses.     Heart sounds: No murmur heard.   No friction rub. No gallop.  Pulmonary:     Effort: No respiratory distress.     Breath sounds: No wheezing, rhonchi or rales.  Musculoskeletal:     Comments: Patient's left hand was visualized she has slight edema at the thumb, there is no associated erythema, lacerations, abrasions, or other gross abnormalities present.  She has full range of motion at the MCP joint, decreased motion at the PIP joint, neurovascular fully intact.  Skin:    General: Skin is warm and dry.  Neurological:     Mental Status: She is alert.  Psychiatric:        Mood and Affect: Mood normal.    ED Results / Procedures / Treatments   Labs (all labs ordered are listed, but only abnormal results are displayed) Labs  Reviewed - No data to display  EKG None  Radiology DG Hand Complete Left  Result Date: 09/09/2020 CLINICAL DATA:  Left thumb pain and swelling.  No known injury. EXAM: LEFT HAND - COMPLETE 3+ VIEW COMPARISON:  None. FINDINGS: There is no evidence of fracture or  dislocation. There is no evidence of arthropathy or other focal bone abnormality. Soft tissues are unremarkable. IMPRESSION: Negative. Electronically Signed   By: Signa Kell M.D.   On: 09/09/2020 06:15    Procedures Procedures   Medications Ordered in ED Medications - No data to display  ED Course  I have reviewed the triage vital signs and the nursing notes.  Pertinent labs & imaging results that were available during my care of the patient were reviewed by me and considered in my medical decision making (see chart for details).    MDM Rules/Calculators/A&P                          Initial impression-patient presents with pain in her left thumb.  She is alert, does not appear in acute stress, vital signs reassuring.  Work-up-due to well-appearing patient, benign to exam, further lab or imaging normal.  Rule out- I have low suspicion for septic arthritis as patient denies IV drug use, skin exam was performed no erythematous, edematous, warm joints noted on exam, no new heart murmur heard on exam.  Low suspicion for fracture or dislocation as x-ray was reviewed yesterday and does not show any fractures or dislocations.  low suspicion for ligament or tendon damage as area was palpated no gross defects noted, she did have slight decreased motion her IP joint suspect this is likely due to pain.  Low suspicion for compartment syndrome as area was palpated it was soft to the touch, neurovascular fully intact.   Plan-  Left thumb pain-suspect inflammation of the tendon, she is currently on prednisone and narcotics feel this is Is an appropriate treatment.  Did recommend NSAIDs but patient cannot take due to blood pressure.  She  has a follow-up with her hand surgeon on Thursday recommended she continue this course.  Return return cautions.  Vital signs have remained stable, no indication for hospital admission.    Patient given at home care as well strict return precautions.  Patient verbalized that they understood agreed to said plan.  Final Clinical Impression(s) / ED Diagnoses Final diagnoses:  Pain of left thumb    Rx / DC Orders ED Discharge Orders     None        Carroll Sage, PA-C 09/10/20 1638    Alvira Monday, MD 09/10/20 2316

## 2020-09-10 NOTE — Discharge Instructions (Signed)
I suspect he might have some inflammation surrounding ligaments of your thumb.  Please continue with the steroids as well as the narcotics that were prescribed to you.  I would also apply ice to the area do this 2-3 times daily and keep it elevated while he is to help with the swelling and pain.  Please follow-up with your hand doctor for further evaluation.  I want to come back to emergency department if you start develop redness around the thumb, worsening swelling, redness moving up into your arm, developing rash, fevers or chills disease or symptoms for further evaluation.

## 2020-09-10 NOTE — ED Triage Notes (Signed)
Pt was seen here yesterday for left thumb pain, no injury and no diagnosis given. Pt has splint in place, reports increase in swelling, pain and now pain into her left forearm.

## 2020-09-16 ENCOUNTER — Other Ambulatory Visit: Payer: Self-pay

## 2020-09-16 ENCOUNTER — Encounter (HOSPITAL_COMMUNITY): Payer: Self-pay

## 2020-09-16 ENCOUNTER — Emergency Department (HOSPITAL_COMMUNITY)
Admission: EM | Admit: 2020-09-16 | Discharge: 2020-09-16 | Disposition: A | Discharge status: Home / Self Care | Payer: Medicaid Other | Attending: Emergency Medicine | Admitting: Emergency Medicine

## 2020-09-16 DIAGNOSIS — Z20822 Contact with and (suspected) exposure to covid-19: Secondary | ICD-10-CM

## 2020-09-16 DIAGNOSIS — Z79899 Other long term (current) drug therapy: Secondary | ICD-10-CM | POA: Insufficient documentation

## 2020-09-16 DIAGNOSIS — J029 Acute pharyngitis, unspecified: Secondary | ICD-10-CM

## 2020-09-16 DIAGNOSIS — U071 COVID-19: Secondary | ICD-10-CM | POA: Diagnosis not present

## 2020-09-16 DIAGNOSIS — I10 Essential (primary) hypertension: Secondary | ICD-10-CM | POA: Diagnosis not present

## 2020-09-16 DIAGNOSIS — Z87891 Personal history of nicotine dependence: Secondary | ICD-10-CM | POA: Diagnosis not present

## 2020-09-16 LAB — GROUP A STREP BY PCR: Group A Strep by PCR: NOT DETECTED

## 2020-09-16 LAB — RESP PANEL BY RT-PCR (FLU A&B, COVID) ARPGX2
Influenza A by PCR: NEGATIVE
Influenza B by PCR: NEGATIVE
SARS Coronavirus 2 by RT PCR: POSITIVE — AB

## 2020-09-16 MED ORDER — CLINDAMYCIN HCL 300 MG PO CAPS: 300.0000 mg | ORAL_CAPSULE | Freq: Three times a day (TID) | ORAL | 0 refills | Status: AC

## 2020-09-16 NOTE — ED Provider Notes (Signed)
[facility]-EMERGENCY DEPT Provider Note   CSN: [id number] Arrival date & time: 09/16/20  6606     History Chief Complaint  Patient presents with   Sore Throat   Headache   Fatigue    Yvette Wilson is a 40 y.o. female.  HPI  40 year old female with a history of gestational diabetes, gestational hypertension, sickle cell trait, who presents to the emergency department today with headaches, fatigue, myalgias, sweats and chills and a sore throat that started today.  She has had a very mild cough but she thinks that that is due to her scratchy throat.  She denies any chest pain or shortness of breath.  She denies any known COVID exposures.  She has had her COVID vaccinations.  Denies any sick contacts.  Past Medical History:  Diagnosis Date   Gestational diabetes    Gestational, diet treated   Gestational hypertension 11/09/2015   History of pre-eclampsia 1999   birth of first child   Hypertension    with all pregnancies, and then continued after last pregnancy   Sickle cell trait [facility])     Patient Active Problem List   Diagnosis Date Noted   Essential hypertension 09/05/2020   Iron deficiency anemia 08/22/2020   Thrombocytopenia (HCC) 08/05/2020   Leukocytosis 08/05/2020   Bradycardia 08/05/2020   Hypertensive urgency 08/04/2020   Prediabetes 06/25/2019   Anxiety attack 07/07/2014    Past Surgical History:  Procedure Laterality Date   APPENDECTOMY     Dec 2007   CESAREAN SECTION     May 2008, breech   CESAREAN SECTION WITH BILATERAL TUBAL LIGATION N/A 12/17/2015   Procedure: CESAREAN SECTION WITH BILATERAL TUBAL LIGATION;  Surgeon: Vena Austria, MD;  Location: [facility] ORS;  Service: Obstetrics;  Laterality: N/A;     OB History     Gravida  4   Para  4   Term  4   Preterm      AB      Living  4      SAB      IAB      Ectopic      Multiple  0   Live Births  1        Obstetric Comments  SVD x 2 and then c/s x 2 with BTL  with last c/s         Family History  Problem Relation Age of Onset   Cancer Maternal Aunt     Social History   Tobacco Use   Smoking status: Former    Packs/day: 0.50    Years: 3.00    Pack years: 1.50    Types: Cigarettes    Quit date: 02/26/2005    Years since quitting: 15.5   Smokeless tobacco: Never  Vaping Use   Vaping Use: Never used  Substance Use Topics   Alcohol use: No   Drug use: No    Home Medications Prior to Admission medications   Medication Sig Start Date End Date Taking? Authorizing Provider  clindamycin (CLEOCIN) 300 MG capsule Take 1 capsule (300 mg total) by mouth 3 (three) times daily for 10 days. 09/16/20 09/26/20 Yes Shamal Stracener S, PA-C  amLODipine (NORVASC) 10 MG tablet Take 1 tablet (10 mg total) by mouth daily. 09/07/20 12/06/20  Rema Fendt, NP  docusate sodium (COLACE) 100 MG capsule Take 1 capsule (100 mg total) by mouth 2 (two) times daily as needed for mild constipation. 09/07/20   Rema Fendt, NP  hydrochlorothiazide (HYDRODIURIL) 25 MG tablet Take 1 tablet (25 mg total) by mouth daily. 09/07/20 12/06/20  Rema Fendt, NP  Iron, Ferrous Sulfate, 325 (65 Fe) MG TABS Take 325 mg by mouth 3 (three) times a week. Take 325 mg (1 tablet total) on Monday, Wednesday, and Friday. 08/24/20 12/22/20  Rema Fendt, NP  methylPREDNISolone (MEDROL DOSEPAK) 4 MG TBPK tablet Take per Dosepak instructions. 09/09/20   Arby Barrette, MD  omeprazole (PRILOSEC) 20 MG capsule Take 1 capsule (20 mg total) by mouth daily. 08/19/20 11/17/20  Rema Fendt, NP  oxyCODONE-acetaminophen (PERCOCET) 5-325 MG tablet Take 1-2 tablets by mouth every 4 (four) hours as needed. 09/09/20   Arby Barrette, MD  valsartan (DIOVAN) 160 MG tablet Take 1 tablet (160 mg total) by mouth daily. 09/07/20 12/06/20  Rema Fendt, NP    Allergies    Penicillins  Review of Systems   Review of Systems  Constitutional:  Positive for chills, diaphoresis and fever.  HENT:   Positive for congestion, postnasal drip and sore throat.   Eyes:  Negative for visual disturbance.  Respiratory:  Positive for cough. Negative for shortness of breath.   Cardiovascular:  Negative for chest pain.  Gastrointestinal:  Negative for abdominal pain, diarrhea, nausea and vomiting.  Genitourinary:  Negative for pelvic pain.  Musculoskeletal:  Positive for myalgias.  Neurological:  Negative for weakness and numbness.   Physical Exam Updated Vital Signs BP (!) 156/110 (BP Location: Left Arm)   Pulse 88   Temp 99.1 F (37.3 C) (Oral)   Resp (!) 22   Ht 5\' 6"  (1.676 m)   Wt 132.5 kg   SpO2 100%   BMI 47.13 kg/m   Physical Exam Vitals and nursing note reviewed.  Constitutional:      General: She is not in acute distress.    Appearance: She is well-developed.  HENT:     Head: Normocephalic and atraumatic.     Mouth/Throat:     Tonsils: 1+ on the right. 1+ on the left.  Eyes:     Conjunctiva/sclera: Conjunctivae normal.  Cardiovascular:     Rate and Rhythm: Normal rate and regular rhythm.     Heart sounds: No murmur heard. Pulmonary:     Effort: Pulmonary effort is normal. No respiratory distress.     Breath sounds: Normal breath sounds.  Abdominal:     Palpations: Abdomen is soft.     Tenderness: There is no abdominal tenderness.  Musculoskeletal:     Cervical back: Neck supple.  Skin:    General: Skin is warm and dry.  Neurological:     Mental Status: She is alert.    ED Results / Procedures / Treatments   Labs (all labs ordered are listed, but only abnormal results are displayed) Labs Reviewed  RESP PANEL BY RT-PCR (FLU A&B, COVID) [id number]  GROUP A STREP BY PCR    EKG None  Radiology No results found.  Procedures Procedures   Medications Ordered in ED Medications - No data to display  ED Course  I have reviewed the triage vital signs and the nursing notes.  Pertinent labs & imaging results that were available during my care of the patient  were reviewed by me and considered in my medical decision making (see chart for details).    MDM Rules/Calculators/A&P                          40 year old female presenting  for evaluation of a sore throat, body aches and chills.  Denies any known COVID exposures.  She has some tonsillar swelling on exam but no unilateral swelling or evidence of a deep space infection.  She was tested for strep and COVID and results are pending at the time of discharge.  She states she has a Clinical cytogeneticist and she will follow up on these results.  I did send Rx for antibiotics to the pharmacy and advised her to fill this medication if her strep test is positive.  She will not fill this medication if the strep test is negative.  Advised supportive measures and advised on return precautions and quarantine measures if COVID test positive.  She voices understanding and is in agreement the plan.  All questions answered.  Patient stable for discharge.   Final Clinical Impression(s) / ED Diagnoses Final diagnoses:  Sore throat  Person under investigation for COVID-19    Rx / DC Orders ED Discharge Orders          Ordered    clindamycin (CLEOCIN) 300 MG capsule  3 times daily        09/16/20 2007             Rayne Du 09/16/20 2011    Gerhard Munch, MD 09/16/20 2356

## 2020-09-16 NOTE — ED Triage Notes (Signed)
Pt reports sore throat, headache, and fatigue since this morning. No reports of pain.

## 2020-09-16 NOTE — ED Provider Notes (Signed)
Emergency Medicine Provider Triage Evaluation Note  Yvette Wilson , a 40 y.o. female  was evaluated in triage.  Pt complains of sore throat, body aches, headaches.  Review of Systems  Positive: Sore throat, body aches, headaches Negative: Chest pain  Physical Exam  BP (!) 156/110 (BP Location: Left Arm)   Pulse 88   Temp 99.1 F (37.3 C) (Oral)   Resp (!) 22   Ht 5\' 6"  (1.676 m)   Wt 132.5 kg   SpO2 100%   BMI 47.13 kg/m  Gen:   Awake, no distress   Resp:  Normal effort  MSK:   Moves extremities without difficulty  Other:    Medical Decision Making  Medically screening exam initiated at 7:45 PM.  Appropriate orders placed.  FUMI GUADRON was informed that the remainder of the evaluation will be completed by another provider, this initial triage assessment does not replace that evaluation, and the importance of remaining in the ED until their evaluation is complete.     [name] 09/16/20 2144    2145, MD 09/16/20 [phone]

## 2020-09-16 NOTE — Discharge Instructions (Addendum)
You were tested for strep throat and COVID.  Your results will be available within the next few hours.  If your strep test is positive you can fill the prescription for the antibiotics.  If your strep test is negative, do not fill this prescription.    If COVID test is positive, You should be isolated for at least 7 days since the onset of your symptoms AND >72 hours after symptoms resolution (absence of fever without the use of fever reducing medicaiton and improvement in respiratory symptoms), whichever is longer  Please follow up with your primary care provider within 5-7 days for re-evaluation of your symptoms. If you do not have a primary care provider, information for a healthcare clinic has been provided for you to make arrangements for follow up care. Please return to the emergency department for any new or worsening symptoms.

## 2020-09-17 ENCOUNTER — Telehealth: Payer: Self-pay | Admitting: Family

## 2020-09-17 NOTE — Telephone Encounter (Signed)
Pt would need an appt for any medication to be prescribed

## 2020-09-17 NOTE — Telephone Encounter (Signed)
Yvette Wilson called this morning and reported she tested positive for Covid and is having difficulty breathing, a sore throat, and is coughing.  Yvette Wilson is asking if you can recommend any options she can take to help with these symptoms.  Thanks

## 2020-09-22 ENCOUNTER — Ambulatory Visit: Payer: Medicaid Other

## 2020-09-22 ENCOUNTER — Other Ambulatory Visit: Payer: Self-pay

## 2020-09-22 ENCOUNTER — Telehealth: Payer: Self-pay | Admitting: Family

## 2020-09-22 NOTE — Telephone Encounter (Signed)
.  Ms. Yvette Wilson, fulbright are scheduled for a virtual visit with your provider today.    Just as we do with appointments in the office, we must obtain your consent to participate.  Your consent will be active for this visit and any virtual visit you may have with one of our providers in the next 365 days.    If you have a MyChart account, I can also send a copy of this consent to you electronically.  All virtual visits are billed to your insurance company just like a traditional visit in the office.  As this is a virtual visit, video technology does not allow for your provider to perform a traditional examination.  This may limit your provider's ability to fully assess your condition.  If your provider identifies any concerns that need to be evaluated in person or the need to arrange testing such as labs, EKG, etc, we will make arrangements to do so.    Although advances in technology are sophisticated, we cannot ensure that it will always work on either your end or our end.  If the connection with a video visit is poor, we may have to switch to a telephone visit.  With either a video or telephone visit, we are not always able to ensure that we have a secure connection.   I need to obtain your verbal consent now.   Are you willing to proceed with your visit today?   [name] has provided verbal consent on 09/22/2020 for a virtual visit (video or telephone).   Katharine Look 09/22/2020  7:59 AM

## 2020-09-30 ENCOUNTER — Encounter: Payer: Medicaid Other | Admitting: Family

## 2020-10-06 ENCOUNTER — Encounter: Payer: Medicaid Other | Admitting: Family

## 2020-10-06 NOTE — Progress Notes (Signed)
Patient did not show for appointment.   

## 2020-11-02 ENCOUNTER — Ambulatory Visit: Payer: Medicaid Other | Admitting: Internal Medicine

## 2020-12-23 ENCOUNTER — Emergency Department (HOSPITAL_COMMUNITY)
Admission: EM | Admit: 2020-12-23 | Discharge: 2020-12-23 | Disposition: A | Discharge status: Home / Self Care | Payer: Medicaid Other | Attending: Emergency Medicine | Admitting: Emergency Medicine

## 2020-12-23 ENCOUNTER — Encounter (HOSPITAL_COMMUNITY): Payer: Self-pay

## 2020-12-23 ENCOUNTER — Emergency Department (HOSPITAL_COMMUNITY): Payer: Medicaid Other

## 2020-12-23 DIAGNOSIS — I10 Essential (primary) hypertension: Secondary | ICD-10-CM | POA: Insufficient documentation

## 2020-12-23 DIAGNOSIS — R002 Palpitations: Secondary | ICD-10-CM | POA: Insufficient documentation

## 2020-12-23 DIAGNOSIS — Z79899 Other long term (current) drug therapy: Secondary | ICD-10-CM | POA: Diagnosis not present

## 2020-12-23 DIAGNOSIS — Z87891 Personal history of nicotine dependence: Secondary | ICD-10-CM | POA: Insufficient documentation

## 2020-12-23 DIAGNOSIS — F419 Anxiety disorder, unspecified: Secondary | ICD-10-CM

## 2020-12-23 LAB — BASIC METABOLIC PANEL
Anion gap: 9 (ref 5–15)
BUN: 11 mg/dL (ref 6–20)
CO2: 28 mmol/L (ref 22–32)
Calcium: 8.9 mg/dL (ref 8.9–10.3)
Chloride: 100 mmol/L (ref 98–111)
Creatinine, Ser: 0.77 mg/dL (ref 0.44–1.00)
GFR, Estimated: >60 mL/min (ref >60)
Glucose, Bld: 105 mg/dL — ABNORMAL HIGH (ref 70–99)
Potassium: 3.5 mmol/L (ref 3.5–5.1)
Sodium: 137 mmol/L (ref 135–145)

## 2020-12-23 LAB — CBC
HCT: 35.7 % — ABNORMAL LOW (ref 36.0–46.0)
Hemoglobin: 11.1 g/dL — ABNORMAL LOW (ref 12.0–15.0)
MCH: 21.5 pg — ABNORMAL LOW (ref 26.0–34.0)
MCHC: 31.1 g/dL (ref 30.0–36.0)
MCV: 69.1 fL — ABNORMAL LOW (ref 80.0–100.0)
Platelets: 396 10*3/uL (ref 150–400)
RBC: 5.17 MIL/uL — ABNORMAL HIGH (ref 3.87–5.11)
RDW: 18.8 % — ABNORMAL HIGH (ref 11.5–15.5)
WBC: 9 10*3/uL (ref 4.0–10.5)
nRBC: 0 % (ref 0.0–0.2)

## 2020-12-23 LAB — TROPONIN I (HIGH SENSITIVITY): Troponin I (High Sensitivity): 4 ng/L (ref <18)

## 2020-12-23 LAB — I-STAT BETA HCG BLOOD, ED (MC, WL, AP ONLY): I-stat hCG, quantitative: <5 m[IU]/mL (ref <5)

## 2020-12-23 NOTE — ED Provider Notes (Signed)
Emergency Medicine Provider Triage Evaluation Note  Yvette Wilson , a 40 y.o. female  was evaluated in triage.  Pt complains of palpitations for 24-48 hours. They come and go every few minutes and feel as if her heart is skipping a beat. Also notes blood pressure is high despite taking her BP medications. Denies headache, vision changes, dizziness, .  Review of Systems  Positive: palpitations Negative: Headache, vision changes, dizziness, vision changes, chest pain and SOB  Physical Exam  BP (!) 169/114 (BP Location: Left Arm)   Pulse 80   Temp 98.5 F (36.9 C) (Oral)   Resp 14   Ht 5\' 6"  (1.676 m)   Wt 131.5 kg   SpO2 100%   BMI 46.81 kg/m  Gen:   Awake, no distress   Resp:  Normal effort  MSK:   Moves extremities without difficulty  Other:  Multiple PVCs on cardiac ascultation. Otherwise normal  Medical Decision Making  Medically screening exam initiated at 9:50 AM.  Appropriate orders placed.  Yvette Wilson was informed that the remainder of the evaluation will be completed by another provider, this initial triage assessment does not replace that evaluation, and the importance of remaining in the ED until their evaluation is complete.  Cardiac labs ordered   [name] 12/23/20 13/09/22    [id number], DO 12/23/20 1436

## 2020-12-23 NOTE — ED Provider Notes (Signed)
[facility]   CSN: [id number] Arrival date & time: 12/23/20  1660     History Chief Complaint  Patient presents with   Palpitations    Yvette Wilson is a 40 y.o. female.  40 year old female presents with palpitations time several days.  Patient notes that she is under regular stress recently.  Denies any syncope or near syncope.  No chest pain or chest pressure.  Patient did not take her blood pressure medication this morning.  Notes that she has had decreased sleep.  Has not taking any medications for her current symptoms.      Past Medical History:  Diagnosis Date   Gestational diabetes    Gestational, diet treated   Gestational hypertension 11/09/2015   History of pre-eclampsia 1999   birth of first child   Hypertension    with all pregnancies, and then continued after last pregnancy   Sickle cell trait [facility])     Patient Active Problem List   Diagnosis Date Noted   Essential hypertension 09/05/2020   Iron deficiency anemia 08/22/2020   Thrombocytopenia (HCC) 08/05/2020   Leukocytosis 08/05/2020   Bradycardia 08/05/2020   Hypertensive urgency 08/04/2020   Prediabetes 06/25/2019   Anxiety attack 07/07/2014    Past Surgical History:  Procedure Laterality Date   APPENDECTOMY     Dec 2007   CESAREAN SECTION     May 2008, breech   CESAREAN SECTION WITH BILATERAL TUBAL LIGATION N/A 12/17/2015   Procedure: CESAREAN SECTION WITH BILATERAL TUBAL LIGATION;  Surgeon: Vena Austria, MD;  Location: [facility] ORS;  Service: Obstetrics;  Laterality: N/A;     OB History     Gravida  4   Para  4   Term  4   Preterm      AB      Living  4      SAB      IAB      Ectopic      Multiple  0   Live Births  1        Obstetric Comments  SVD x 2 and then c/s x 2 with BTL with last c/s         Family History  Problem Relation Age of Onset   Cancer Maternal Aunt     Social History   Tobacco Use    Smoking status: Former    Packs/day: 0.50    Years: 3.00    Pack years: 1.50    Types: Cigarettes    Quit date: 02/26/2005    Years since quitting: 15.8   Smokeless tobacco: Never  Vaping Use   Vaping Use: Never used  Substance Use Topics   Alcohol use: No   Drug use: No    Home Medications Prior to Admission medications   Medication Sig Start Date End Date Taking? Authorizing Provider  amLODipine (NORVASC) 10 MG tablet Take 1 tablet (10 mg total) by mouth daily. 09/07/20 12/06/20  Rema Fendt, NP  docusate sodium (COLACE) 100 MG capsule Take 1 capsule (100 mg total) by mouth 2 (two) times daily as needed for mild constipation. 09/07/20   Rema Fendt, NP  hydrochlorothiazide (HYDRODIURIL) 25 MG tablet Take 1 tablet (25 mg total) by mouth daily. 09/07/20 12/06/20  Rema Fendt, NP  Iron, Ferrous Sulfate, 325 (65 Fe) MG TABS Take 325 mg by mouth 3 (three) times a week. Take 325 mg (1 tablet total) on Monday, Wednesday, and Friday. 08/24/20 12/22/20  Zonia Kief, Amy J, NP  methylPREDNISolone (MEDROL DOSEPAK) 4 MG TBPK tablet Take per Dosepak instructions. 09/09/20   Arby Barrette, MD  omeprazole (PRILOSEC) 20 MG capsule Take 1 capsule (20 mg total) by mouth daily. 08/19/20 11/17/20  Rema Fendt, NP  oxyCODONE-acetaminophen (PERCOCET) 5-325 MG tablet Take 1-2 tablets by mouth every 4 (four) hours as needed. 09/09/20   Arby Barrette, MD  valsartan (DIOVAN) 160 MG tablet Take 1 tablet (160 mg total) by mouth daily. 09/07/20 12/06/20  Rema Fendt, NP    Allergies    Penicillins  Review of Systems   Review of Systems  All other systems reviewed and are negative.  Physical Exam Updated Vital Signs BP (!) 186/120   Pulse 68   Temp 98.5 F (36.9 C) (Oral)   Resp 14   Ht 1.676 m (5\' 6" )   Wt 131.5 kg   SpO2 100%   BMI 46.81 kg/m   Physical Exam Vitals and nursing note reviewed.  Constitutional:      General: She is not in acute distress.    Appearance: Normal  appearance. She is well-developed. She is not toxic-appearing.  HENT:     Head: Normocephalic and atraumatic.  Eyes:     General: Lids are normal.     Conjunctiva/sclera: Conjunctivae normal.     Pupils: Pupils are equal, round, and reactive to light.  Neck:     Thyroid: No thyroid mass.     Trachea: No tracheal deviation.  Cardiovascular:     Rate and Rhythm: Normal rate and regular rhythm.     Heart sounds: Normal heart sounds. No murmur heard.   No gallop.  Pulmonary:     Effort: Pulmonary effort is normal. No respiratory distress.     Breath sounds: Normal breath sounds. No stridor. No decreased breath sounds, wheezing, rhonchi or rales.  Abdominal:     General: There is no distension.     Palpations: Abdomen is soft.     Tenderness: There is no abdominal tenderness. There is no rebound.  Musculoskeletal:        General: No tenderness. Normal range of motion.     Cervical back: Normal range of motion and neck supple.  Skin:    General: Skin is warm and dry.     Findings: No abrasion or rash.  Neurological:     Mental Status: She is alert and oriented to person, place, and time. Mental status is at baseline.     GCS: GCS eye subscore is 4. GCS verbal subscore is 5. GCS motor subscore is 6.     Cranial Nerves: No cranial nerve deficit.     Sensory: No sensory deficit.     Motor: Motor function is intact.  Psychiatric:        Attention and Perception: Attention normal.        Speech: Speech normal.        Behavior: Behavior normal.    ED Results / Procedures / Treatments   Labs (all labs ordered are listed, but only abnormal results are displayed) Labs Reviewed  BASIC METABOLIC PANEL - Abnormal; Notable for the following components:      Result Value   Glucose, Bld 105 (*)    All other components within normal limits  CBC - Abnormal; Notable for the following components:   RBC 5.17 (*)    Hemoglobin 11.1 (*)    HCT 35.7 (*)    MCV 69.1 (*)    MCH 21.5 (*)  RDW  18.8 (*)    All other components within normal limits  I-STAT BETA HCG BLOOD, ED ([facility], [facility], [facility] ONLY)  TROPONIN I (HIGH SENSITIVITY)  TROPONIN I (HIGH SENSITIVITY)    EKG EKG Interpretation  Date/Time:  Wednesday December 23 2020 09:31:56 EST Ventricular Rate:  71 PR Interval:  147 QRS Duration: 86 QT Interval:  398 QTC Calculation: 433 R Axis:   52 Text Interpretation: Sinus rhythm Probable left atrial enlargement Probable left ventricular hypertrophy Confirmed by Lorre Nick ([id number]) on 12/23/2020 3:51:43 PM  Radiology DG Chest 2 View  Result Date: 12/23/2020 CLINICAL DATA:  Heart palpitations. EXAM: CHEST - 2 VIEW COMPARISON:  08/04/2020 FINDINGS: Normal heart size and mediastinal contours. No acute infiltrate or edema. No effusion or pneumothorax. No acute osseous findings. IMPRESSION: Negative chest. Electronically Signed   By: Tiburcio Pea M.D.   On: 12/23/2020 09:53    Procedures Procedures   Medications Ordered in ED Medications - No data to display  ED Course  I have reviewed the triage vital signs and the nursing notes.  Pertinent labs & imaging results that were available during my care of the patient were reviewed by me and considered in my medical decision making (see chart for details).    MDM Rules/Calculators/A&P                           Patient's labs are reassuring here.  EKG without ischemic changes.  Patient blood pressure elevated here but she has not had her 3 normal blood pressure meds.  Patient will take this when she gets home.  She will also follow-up with her physician for treatment of her underlying anxiety Final Clinical Impression(s) / ED Diagnoses Final diagnoses:  None    Rx / DC Orders ED Discharge Orders     None        Lorre Nick, MD 12/23/20 1615

## 2020-12-23 NOTE — ED Triage Notes (Signed)
Pt arrived via POV, c/o palpitations on and off since Friday. States feels worse with mvmt, getting anxious. Denies any chest pain.

## 2021-01-20 ENCOUNTER — Emergency Department
Admission: EM | Admit: 2021-01-20 | Discharge: 2021-01-20 | Disposition: A | Discharge status: Home / Self Care | Payer: Medicaid Other | Attending: Emergency Medicine | Admitting: Emergency Medicine

## 2021-01-20 DIAGNOSIS — I10 Essential (primary) hypertension: Secondary | ICD-10-CM | POA: Insufficient documentation

## 2021-01-20 DIAGNOSIS — M5441 Lumbago with sciatica, right side: Secondary | ICD-10-CM | POA: Diagnosis not present

## 2021-01-20 DIAGNOSIS — Z87891 Personal history of nicotine dependence: Secondary | ICD-10-CM | POA: Insufficient documentation

## 2021-01-20 DIAGNOSIS — M5431 Sciatica, right side: Secondary | ICD-10-CM

## 2021-01-20 DIAGNOSIS — M79604 Pain in right leg: Secondary | ICD-10-CM | POA: Diagnosis present

## 2021-01-20 DIAGNOSIS — Z79899 Other long term (current) drug therapy: Secondary | ICD-10-CM | POA: Insufficient documentation

## 2021-01-20 MED ORDER — IBUPROFEN 600 MG PO TABS: 600.0000 mg | ORAL_TABLET | Freq: Three times a day (TID) | ORAL | 0 refills | Status: AC

## 2021-01-20 MED ORDER — HYDROCODONE-ACETAMINOPHEN 5-325 MG PO TABS: 1.0000 | ORAL_TABLET | Freq: Four times a day (QID) | ORAL | 0 refills | Status: DC | PRN

## 2021-01-20 MED ORDER — CYCLOBENZAPRINE HCL 10 MG PO TABS: 10.0000 mg | ORAL_TABLET | Freq: Three times a day (TID) | ORAL | 0 refills | Status: DC | PRN

## 2021-01-20 MED ORDER — KETOROLAC TROMETHAMINE 60 MG/2ML IM SOLN
60.0000 mg | Freq: Once | INTRAMUSCULAR | Status: AC
  Administered 2021-01-20: 60 mg via INTRAMUSCULAR
  Filled 2021-01-20: qty 2

## 2021-01-20 MED ORDER — DEXAMETHASONE SODIUM PHOSPHATE 10 MG/ML IJ SOLN
10.0000 mg | Freq: Once | INTRAMUSCULAR | Status: AC
  Administered 2021-01-20: 10 mg via INTRAMUSCULAR
  Filled 2021-01-20: qty 1

## 2021-01-20 NOTE — ED Provider Notes (Signed)
[facility] Emergency Department Provider Note  ____________________________________________   Event Date/Time   First MD Initiated Contact with Patient 01/20/21 [phone]     (approximate)  I have reviewed the triage vital signs and the nursing notes.   HISTORY  Chief Complaint No chief complaint on file.    HPI Yvette Wilson is a 40 y.o. female with past medical history of hypertension here with right leg pain.  The patient states that over the last 2 days, Yvette Wilson has had progressive worsening aching, throbbing, right hip pain.  It originated after Yvette Wilson got out of her car after work on Monday.  Yvette Wilson denies any trauma.  Yvette Wilson states Yvette Wilson began to feel mild, aching pain in her right buttock/gluteal area, and this pain is progressively worsened and is now severe, 10 of 10, worse with any movement.  It is worse when Yvette Wilson tries to flex her leg up.  Yvette Wilson states the pain occasionally shoots down the back of her leg to the level of her knee but not past it.  Denies any numbness or weakness.  No loss of bowel or bladder function.  No midline pain.  No other medical complaints.  No fevers or chills.   Past Medical History:  Diagnosis Date   Gestational diabetes    Gestational, diet treated   Gestational hypertension 11/09/2015   History of pre-eclampsia 1999   birth of first child   Hypertension    with all pregnancies, and then continued after last pregnancy   Sickle cell trait [facility])     Patient Active Problem List   Diagnosis Date Noted   Essential hypertension 09/05/2020   Iron deficiency anemia 08/22/2020   Thrombocytopenia (HCC) 08/05/2020   Leukocytosis 08/05/2020   Bradycardia 08/05/2020   Hypertensive urgency 08/04/2020   Prediabetes 06/25/2019   Anxiety attack 07/07/2014    Past Surgical History:  Procedure Laterality Date   APPENDECTOMY     Dec 2007   CESAREAN SECTION     May 2008, breech   CESAREAN SECTION WITH BILATERAL TUBAL LIGATION N/A 12/17/2015    Procedure: CESAREAN SECTION WITH BILATERAL TUBAL LIGATION;  Surgeon: Vena Austria, MD;  Location: [facility] ORS;  Service: Obstetrics;  Laterality: N/A;    Prior to Admission medications   Medication Sig Start Date End Date Taking? Authorizing Provider  cyclobenzaprine (FLEXERIL) 10 MG tablet Take 1 tablet (10 mg total) by mouth 3 (three) times daily as needed for muscle spasms. 01/20/21  Yes Shaune Pollack, MD  HYDROcodone-acetaminophen (NORCO/VICODIN) 5-325 MG tablet Take 1-2 tablets by mouth every 6 (six) hours as needed for severe pain. 01/20/21 01/20/22 Yes Shaune Pollack, MD  ibuprofen (ADVIL) 600 MG tablet Take 1 tablet (600 mg total) by mouth 3 (three) times daily for 7 days. 01/20/21 01/27/21 Yes Shaune Pollack, MD  amLODipine (NORVASC) 10 MG tablet Take 1 tablet (10 mg total) by mouth daily. 09/07/20 12/06/20  Rema Fendt, NP  docusate sodium (COLACE) 100 MG capsule Take 1 capsule (100 mg total) by mouth 2 (two) times daily as needed for mild constipation. 09/07/20   Rema Fendt, NP  hydrochlorothiazide (HYDRODIURIL) 25 MG tablet Take 1 tablet (25 mg total) by mouth daily. 09/07/20 12/06/20  Rema Fendt, NP  Iron, Ferrous Sulfate, 325 (65 Fe) MG TABS Take 325 mg by mouth 3 (three) times a week. Take 325 mg (1 tablet total) on Monday, Wednesday, and Friday. 08/24/20 12/22/20  Rema Fendt, NP  methylPREDNISolone (MEDROL DOSEPAK) 4 MG  TBPK tablet Take per Dosepak instructions. 09/09/20   Arby Barrette, MD  omeprazole (PRILOSEC) 20 MG capsule Take 1 capsule (20 mg total) by mouth daily. 08/19/20 11/17/20  Rema Fendt, NP  oxyCODONE-acetaminophen (PERCOCET) 5-325 MG tablet Take 1-2 tablets by mouth every 4 (four) hours as needed. 09/09/20   Arby Barrette, MD  valsartan (DIOVAN) 160 MG tablet Take 1 tablet (160 mg total) by mouth daily. 09/07/20 12/06/20  Rema Fendt, NP    Allergies Penicillins  Family History  Problem Relation Age of Onset   Cancer Maternal Aunt      Social History Social History   Tobacco Use   Smoking status: Former    Packs/day: 0.50    Years: 3.00    Pack years: 1.50    Types: Cigarettes    Quit date: 02/26/2005    Years since quitting: 15.9   Smokeless tobacco: Never  Vaping Use   Vaping Use: Never used  Substance Use Topics   Alcohol use: No   Drug use: No    Review of Systems  Review of Systems  Constitutional:  Negative for chills and fever.  HENT:  Negative for sore throat.   Respiratory:  Negative for shortness of breath.   Cardiovascular:  Negative for chest pain.  Gastrointestinal:  Negative for abdominal pain.  Genitourinary:  Negative for flank pain.  Musculoskeletal:  Positive for arthralgias and back pain. Negative for neck pain.  Skin:  Negative for rash and wound.  Allergic/Immunologic: Negative for immunocompromised state.  Neurological:  Negative for weakness and numbness.  Hematological:  Does not bruise/bleed easily.  All other systems reviewed and are negative.   ____________________________________________  PHYSICAL EXAM:      VITAL SIGNS: ED Triage Vitals  Enc Vitals Group     BP 01/20/21 0815 (!) 169/102     Pulse Rate 01/20/21 0815 83     Resp 01/20/21 0815 16     Temp 01/20/21 0815 98.1 F (36.7 C)     Temp Source 01/20/21 0815 Oral     SpO2 01/20/21 0815 99 %     Weight 01/20/21 0816 297 lb (134.7 kg)     Height 01/20/21 0816 5\' 7"  (1.702 m)     Head Circumference --      Peak Flow --      Pain Score 01/20/21 0816 10     Pain Loc --      Pain Edu? --      Excl. in GC? --      Physical Exam Vitals and nursing note reviewed.  Constitutional:      General: Yvette Wilson is not in acute distress.    Appearance: Yvette Wilson is well-developed.  HENT:     Head: Normocephalic and atraumatic.  Eyes:     Conjunctiva/sclera: Conjunctivae normal.  Cardiovascular:     Rate and Rhythm: Normal rate and regular rhythm.     Heart sounds: Normal heart sounds.  Pulmonary:     Effort: Pulmonary  effort is normal. No respiratory distress.     Breath sounds: No wheezing.  Abdominal:     General: There is no distension.  Musculoskeletal:     Cervical back: Neck supple.     Comments: Moderate right paraspinal tenderness of the lumbosacral spine overlying the sciatic nerve.  Skin:    General: Skin is warm.     Capillary Refill: Capillary refill takes less than 2 seconds.     Findings: No rash.  Neurological:  Mental Status: Yvette Wilson is alert and oriented to person, place, and time.     Motor: No abnormal muscle tone.     Comments: Great toe extension and flexion as well as full movement at the ankle.  Quad reflexes 2+ and symmetric.  Normal sensation to light touch bilateral lower extremities.      ____________________________________________   LABS (all labs ordered are listed, but only abnormal results are displayed)  Labs Reviewed - No data to display  ____________________________________________  EKG:  ________________________________________  RADIOLOGY All imaging, including plain films, CT scans, and ultrasounds, independently reviewed by me, and interpretations confirmed via formal radiology reads.  ED MD interpretation:     Official radiology report(s): No results found.  ____________________________________________  PROCEDURES   Procedure(s) performed (including Critical Care):  Procedures  ____________________________________________  INITIAL IMPRESSION / MDM / ASSESSMENT AND PLAN / ED COURSE  As part of my medical decision making, I reviewed the following data within the electronic MEDICAL RECORD NUMBER Nursing notes reviewed and incorporated, Old chart reviewed, Notes from prior ED visits, and [place] Controlled Substance Database       *KIMYAH FREIN was evaluated in Emergency Department on 01/20/2021 for the symptoms described in the history of present illness. Yvette Wilson was evaluated in the context of the global COVID-19 pandemic, which necessitated  consideration that the patient might be at risk for infection with the SARS-CoV-2 virus that causes COVID-19. Institutional protocols and algorithms that pertain to the evaluation of patients at risk for COVID-19 are in a state of rapid change based on information released by regulatory bodies including the CDC and federal and state organizations. These policies and algorithms were followed during the patient's care in the ED.  Some ED evaluations and interventions may be delayed as a result of limited staffing during the pandemic.*     Medical Decision Making: 40 year old female here with right sided back and leg pain, consistent with sciatica.  Patient has pinpoint tenderness over sciatic nerve exit, with symptoms highly consistent with this.  There is no direct trauma.  Yvette Wilson is neurovascularly intact distally with no signs of significant radiculopathy or cord compromise.  No signs of cauda equina.  Yvette Wilson is afebrile and well-appearing with no infectious symptoms.  Will treat supportively, discharged with analgesics.  Yvette Wilson was given a dose of steroid here in the ED.  Return precautions given.  ____________________________________________  FINAL CLINICAL IMPRESSION(S) / ED DIAGNOSES  Final diagnoses:  Sciatica of right side     MEDICATIONS GIVEN DURING THIS VISIT:  Medications  ketorolac (TORADOL) injection 60 mg (has no administration in time range)  dexamethasone (DECADRON) injection 10 mg (has no administration in time range)     ED Discharge Orders          Ordered    HYDROcodone-acetaminophen (NORCO/VICODIN) 5-325 MG tablet  Every 6 hours PRN        01/20/21 0920    cyclobenzaprine (FLEXERIL) 10 MG tablet  3 times daily PRN        01/20/21 0920    ibuprofen (ADVIL) 600 MG tablet  3 times daily        01/20/21 0920             Note:  This document was prepared using Dragon voice recognition software and may include unintentional dictation errors.   Shaune Pollack,  MD 01/20/21 [phone]

## 2021-01-20 NOTE — Discharge Instructions (Signed)
Follow the instructions for stretching/exercise  Take the medications as prescribed

## 2021-01-20 NOTE — Progress Notes (Signed)
Erroneous encounter

## 2021-01-20 NOTE — ED Notes (Signed)
Will discharge pt once AVS ready.

## 2021-01-20 NOTE — ED Notes (Signed)
Pt comes to ED because since 3 days ago has had progressively worse, debilitating pain in R buttock and thigh area. Pt works as Museum/gallery conservator, noticed on Monday that suddenly it hurt to move and change positions when getting into car after work. Pt does not know of any injury. States she cannot get comfortable, cannot sleep, it hurts to move and she has 40 y/o at home and can hardly get up to take care of him. Describes pain as 10+/10, shooting, R buttock and back of thigh.

## 2021-01-20 NOTE — ED Triage Notes (Addendum)
Pt reports right hip pain since Monday, state that the pain radiates down her leg also states right buttock pain, denies any known injury, pt states that she hasn't taken her bp meds yet this am

## 2021-01-25 ENCOUNTER — Encounter: Payer: Medicaid Other | Admitting: Family

## 2021-01-25 DIAGNOSIS — Z09 Encounter for follow-up examination after completed treatment for conditions other than malignant neoplasm: Secondary | ICD-10-CM

## 2021-01-25 DIAGNOSIS — R002 Palpitations: Secondary | ICD-10-CM

## 2021-01-25 DIAGNOSIS — I10 Essential (primary) hypertension: Secondary | ICD-10-CM

## 2021-01-25 DIAGNOSIS — F411 Generalized anxiety disorder: Secondary | ICD-10-CM

## 2021-01-25 DIAGNOSIS — M5431 Sciatica, right side: Secondary | ICD-10-CM

## 2021-02-19 ENCOUNTER — Encounter: Payer: Self-pay | Admitting: Emergency Medicine

## 2021-02-19 ENCOUNTER — Other Ambulatory Visit: Payer: Self-pay

## 2021-02-19 ENCOUNTER — Emergency Department
Admission: EM | Admit: 2021-02-19 | Discharge: 2021-02-19 | Disposition: A | Discharge status: Home / Self Care | Payer: Medicaid Other | Attending: Emergency Medicine | Admitting: Emergency Medicine

## 2021-02-19 ENCOUNTER — Emergency Department: Payer: Medicaid Other

## 2021-02-19 DIAGNOSIS — S39012A Strain of muscle, fascia and tendon of lower back, initial encounter: Secondary | ICD-10-CM | POA: Diagnosis not present

## 2021-02-19 DIAGNOSIS — I10 Essential (primary) hypertension: Secondary | ICD-10-CM | POA: Diagnosis not present

## 2021-02-19 DIAGNOSIS — I160 Hypertensive urgency: Secondary | ICD-10-CM | POA: Diagnosis not present

## 2021-02-19 DIAGNOSIS — X58XXXA Exposure to other specified factors, initial encounter: Secondary | ICD-10-CM | POA: Insufficient documentation

## 2021-02-19 DIAGNOSIS — S3992XA Unspecified injury of lower back, initial encounter: Secondary | ICD-10-CM | POA: Diagnosis present

## 2021-02-19 LAB — URINALYSIS, ROUTINE W REFLEX MICROSCOPIC
Bacteria, UA: NONE SEEN
Bilirubin Urine: NEGATIVE
Glucose, UA: NEGATIVE mg/dL
Ketones, ur: NEGATIVE mg/dL
Leukocytes,Ua: NEGATIVE
Nitrite: NEGATIVE
Protein, ur: NEGATIVE mg/dL
Specific Gravity, Urine: 1.018 (ref 1.005–1.030)
pH: 5 (ref 5.0–8.0)

## 2021-02-19 LAB — POC URINE PREG, ED: Preg Test, Ur: NEGATIVE

## 2021-02-19 MED ORDER — OXYCODONE-ACETAMINOPHEN 5-325 MG PO TABS
1.0000 | ORAL_TABLET | Freq: Once | ORAL | Status: AC
  Administered 2021-02-19: 1 via ORAL
  Filled 2021-02-19: qty 1

## 2021-02-19 MED ORDER — NAPROXEN 250 MG PO TABS: 250.0000 mg | ORAL_TABLET | Freq: Two times a day (BID) | ORAL | 0 refills | Status: AC

## 2021-02-19 MED ORDER — OXYCODONE HCL 5 MG PO TABS: 5.0000 mg | ORAL_TABLET | Freq: Three times a day (TID) | ORAL | 0 refills | Status: AC | PRN

## 2021-02-19 MED ORDER — IBUPROFEN 400 MG PO TABS
400.0000 mg | ORAL_TABLET | Freq: Once | ORAL | Status: AC
  Administered 2021-02-19: 400 mg via ORAL
  Filled 2021-02-19: qty 1

## 2021-02-19 MED ORDER — LIDOCAINE 5 % EX PTCH: 1.0000 | MEDICATED_PATCH | CUTANEOUS | 0 refills | Status: DC

## 2021-02-19 MED ORDER — CYCLOBENZAPRINE HCL 10 MG PO TABS: 10.0000 mg | ORAL_TABLET | Freq: Three times a day (TID) | ORAL | 0 refills | Status: DC | PRN

## 2021-02-19 MED ORDER — CYCLOBENZAPRINE HCL 10 MG PO TABS: 10.0000 mg | ORAL_TABLET | Freq: Three times a day (TID) | ORAL | 0 refills | Status: AC | PRN

## 2021-02-19 MED ORDER — NAPROXEN 250 MG PO TABS: 250.0000 mg | ORAL_TABLET | Freq: Two times a day (BID) | ORAL | 0 refills | Status: DC

## 2021-02-19 MED ORDER — OXYCODONE HCL 5 MG PO TABS
5.0000 mg | ORAL_TABLET | Freq: Three times a day (TID) | ORAL | 0 refills | Status: DC | PRN
Start: 2021-02-19 — End: 2021-02-19

## 2021-02-19 MED ORDER — CYCLOBENZAPRINE HCL 10 MG PO TABS
10.0000 mg | ORAL_TABLET | Freq: Once | ORAL | Status: AC
  Administered 2021-02-19: 10 mg via ORAL
  Filled 2021-02-19: qty 1

## 2021-02-19 NOTE — ED Provider Notes (Signed)
[facility] Provider Note    Event Date/Time   First MD Initiated Contact with Patient 02/19/21 1338     (approximate)   History   Back Pain   HPI  Yvette Wilson is a 41 y.o. female with past medical history of hypertension who presents with back pain.  Patient notes that was here about a month ago for right-sided back pain was diagnosed with sciatica.  She was prescribed Flexeril ibuprofen and hydrocodone which she has been taking.  Initially improved.  Then over the last week she has had progressively worsening pain.  It is worse on the left side but is bilateral.  There is no radicular symptoms.  No numbness or weakness in her extremities no bowel or bladder incontinence fevers chills.  No history of IV drug use.  Pain does radiate to the bilateral lower quadrants.  Does not yet followed up with her primary doctor    Past Medical History:  Diagnosis Date   Gestational diabetes    Gestational, diet treated   Gestational hypertension 11/09/2015   History of pre-eclampsia 1999   birth of first child   Hypertension    with all pregnancies, and then continued after last pregnancy   Sickle cell trait [facility])     Patient Active Problem List   Diagnosis Date Noted   Essential hypertension 09/05/2020   Iron deficiency anemia 08/22/2020   Thrombocytopenia (HCC) 08/05/2020   Leukocytosis 08/05/2020   Bradycardia 08/05/2020   Hypertensive urgency 08/04/2020   Prediabetes 06/25/2019   Anxiety attack 07/07/2014     Physical Exam  Triage Vital Signs: ED Triage Vitals  Enc Vitals Group     BP 02/19/21 1307 (!) 151/95     Pulse Rate 02/19/21 1307 75     Resp 02/19/21 1307 16     Temp 02/19/21 1307 98.5 F (36.9 C)     Temp Source 02/19/21 1307 Oral     SpO2 02/19/21 1307 98 %     Weight 02/19/21 1304 296 lb 15.4 oz (134.7 kg)     Height 02/19/21 1304 5\' 7"  (1.702 m)     Head Circumference --      Peak Flow --      Pain Score 02/19/21 1304 7      Pain Loc --      Pain Edu? --      Excl. in GC? --     Most recent vital signs: Vitals:   02/19/21 1307  BP: (!) 151/95  Pulse: 75  Resp: 16  Temp: 98.5 F (36.9 C)  SpO2: 98%     General: Awake, no distress.  CV:  Good peripheral perfusion.  Resp:  Normal effort.  Abd:  No distention.  Abdomen is soft and nontender Neuro:             Awake, Alert, Oriented x 3  Other:  Tenderness to palpation in the lumbar midline, no CVA tenderness 5 out of 5 strength with hip flexion, plantar flexion, dorsiflexion   ED Results / Procedures / Treatments  Labs (all labs ordered are listed, but only abnormal results are displayed) Labs Reviewed  URINALYSIS, ROUTINE W REFLEX MICROSCOPIC - Abnormal; Notable for the following components:      Result Value   Color, Urine YELLOW (*)    APPearance HAZY (*)    Hgb urine dipstick SMALL (*)    All other components within normal limits  POC URINE PREG, ED  EKG     RADIOLOGY I reviewed the x-ray of the lumbar spine which does not show any fracture, agree with radiology report   PROCEDURES:  Critical Care performed: No  Procedures   MEDICATIONS ORDERED IN ED: Medications  cyclobenzaprine (FLEXERIL) tablet 10 mg (10 mg Oral Given 02/19/21 1430)  oxyCODONE-acetaminophen (PERCOCET/ROXICET) 5-325 MG per tablet 1 tablet (1 tablet Oral Given 02/19/21 1430)  ibuprofen (ADVIL) tablet 400 mg (400 mg Oral Given 02/19/21 1430)     IMPRESSION / MDM / ASSESSMENT AND PLAN / ED COURSE  I reviewed the triage vital signs and the nursing notes.                              Differential diagnosis includes, but is not limited to, lumbar strain, sciatica, UTI  Patient is a 41 year old female presenting with back pain.  Was seen about a month ago for right-sided sciatica which initially improved but then for the past week has had more left-sided pain which also extends bilaterally.  She has no radicular symptoms no red flag symptoms for cord  compression including fevers numbness weakness saddle anesthesia or incontinence.  No risk factors for spinal epidural abscess coding no IV drug use or immunosuppression.  On exam she has midline lumbar tenderness.  She has normal strength and sensation in her lower extremities.  Patient did complain of some radiation of the pain along to her abdomen but her abdominal exam is benign.  UA was obtained which is negative for signs of infection.  Suspect musculoskeletal back pain.  Will treat supportively.  Patient having a hard time sleeping.  Will refill the Flexeril and a very short course of oxycodone.  Recommended ongoing NSAIDs and will give orthopedic follow-up as well.     FINAL CLINICAL IMPRESSION(S) / ED DIAGNOSES   Final diagnoses:  Strain of lumbar region, initial encounter     Rx / DC Orders   ED Discharge Orders          Ordered    cyclobenzaprine (FLEXERIL) 10 MG tablet  3 times daily PRN,   Status:  Discontinued        02/19/21 1413    oxyCODONE (ROXICODONE) 5 MG immediate release tablet  Every 8 hours PRN,   Status:  Discontinued        02/19/21 1413    naproxen (NAPROSYN) 250 MG tablet  2 times daily with meals,   Status:  Discontinued        02/19/21 1413    lidocaine (LIDODERM) 5 %  Every 24 hours,   Status:  Discontinued        02/19/21 1413    cyclobenzaprine (FLEXERIL) 10 MG tablet  3 times daily PRN        02/19/21 1433    lidocaine (LIDODERM) 5 %  Every 24 hours        02/19/21 1433    naproxen (NAPROSYN) 250 MG tablet  2 times daily with meals        02/19/21 1433    oxyCODONE (ROXICODONE) 5 MG immediate release tablet  Every 8 hours PRN        02/19/21 1433             Note:  This document was prepared using Dragon voice recognition software and may include unintentional dictation errors.   Georga Hacking, MD 02/19/21 [phone]

## 2021-02-19 NOTE — Discharge Instructions (Addendum)
Please start taking the naproxen and you can use a Lidoderm patch for your pain.  You can take the Flexeril and oxycodone for breakthrough pain.  Please follow-up with both your primary care provider and an orthopedist.

## 2021-02-19 NOTE — ED Provider Triage Note (Signed)
Emergency Medicine Provider Triage Evaluation Note  Yvette Wilson , a 41 y.o. female  was evaluated in triage.  Pt complains of low back pain radiates around to the abdomen x3 weeks.  Used muscle relaxers and medication that was given to her previously without any relief.  Review of Systems  Positive: Back pain Negative: Vomiting, diarrhea, dysuria  Physical Exam  Ht 5\' 7"  (1.702 m)    Wt 134.7 kg    LMP 02/07/2021    BMI 46.51 kg/m  Gen:   Awake, no distress   Resp:  Normal effort  MSK:   Moves extremities without difficulty, lumbar spine tender to palpate Other:    Medical Decision Making  Medically screening exam initiated at 1:06 PM.  Appropriate orders placed.  SETAREH ROM was informed that the remainder of the evaluation will be completed by another provider, this initial triage assessment does not replace that evaluation, and the importance of remaining in the ED until their evaluation is complete.     [name], PA-C 02/19/21 1306

## 2021-02-19 NOTE — ED Triage Notes (Signed)
C?O lower back pain.  States lower back started hurting Monday.  Has recent history of right sided sciatic pain, Monday pain was across lower back and radiates around to lower abdomen on both sides.  C?O pain with urination, moving bowels, movement.

## 2021-04-14 ENCOUNTER — Telehealth: Payer: Self-pay | Admitting: Family

## 2021-04-14 NOTE — Telephone Encounter (Signed)
Unable to contact pt invalid number [phone], if pt calls back will need to schedule appt for medication refills  ?

## 2021-04-14 NOTE — Telephone Encounter (Signed)
Schedule in-person appointment. Last appointment was 09/07/2021 via telemedicine.

## 2021-04-14 NOTE — Telephone Encounter (Signed)
Patient called requesting refills on her: Amlodipine; Hydrochlorothiazide & Valsartan. Please send to [facility] pharmacy in [place]. ?

## 2021-04-24 NOTE — Progress Notes (Unsigned)
Patient ID: Yvette Wilson, female    DOB: 1980/08/26  MRN: [id number]  CC: Hypertension Follow-Up  Subjective: Yvette Wilson is a 41 y.o. female who presents for hypertension follow-up.   Her concerns today include:  HYPERTENSION FOLLOW-UP: 09/07/2020: - Continue Valsartan and Amlodipine as prescribed.  - Increase Hydrochlorothiazide from 12.5 mg daily to 25 mg daily.   04/27/2021:   Patient Active Problem List   Diagnosis Date Noted   Essential hypertension 09/05/2020   Iron deficiency anemia 08/22/2020   Thrombocytopenia (HCC) 08/05/2020   Leukocytosis 08/05/2020   Bradycardia 08/05/2020   Hypertensive urgency 08/04/2020   Prediabetes 06/25/2019   Anxiety attack 07/07/2014     Current Outpatient Medications on File Prior to Visit  Medication Sig Dispense Refill   amLODipine (NORVASC) 10 MG tablet Take 1 tablet (10 mg total) by mouth daily. 90 tablet 0   cyclobenzaprine (FLEXERIL) 10 MG tablet Take 1 tablet (10 mg total) by mouth 3 (three) times daily as needed for muscle spasms. 21 tablet 0   docusate sodium (COLACE) 100 MG capsule Take 1 capsule (100 mg total) by mouth 2 (two) times daily as needed for mild constipation. 60 capsule 0   hydrochlorothiazide (HYDRODIURIL) 25 MG tablet Take 1 tablet (25 mg total) by mouth daily. 90 tablet 0   HYDROcodone-acetaminophen (NORCO/VICODIN) 5-325 MG tablet Take 1-2 tablets by mouth every 6 (six) hours as needed for severe pain. 12 tablet 0   Iron, Ferrous Sulfate, 325 (65 Fe) MG TABS Take 325 mg by mouth 3 (three) times a week. Take 325 mg (1 tablet total) on Monday, Wednesday, and Friday. 50 tablet 0   lidocaine (LIDODERM) 5 % Place 1 patch onto the skin daily. Remove & Discard patch within 12 hours or as directed by MD 30 patch 0   methylPREDNISolone (MEDROL DOSEPAK) 4 MG TBPK tablet Take per Dosepak instructions. 21 tablet 0   omeprazole (PRILOSEC) 20 MG capsule Take 1 capsule (20 mg total) by mouth daily. 90 capsule 0    oxyCODONE-acetaminophen (PERCOCET) 5-325 MG tablet Take 1-2 tablets by mouth every 4 (four) hours as needed. 20 tablet 0   valsartan (DIOVAN) 160 MG tablet Take 1 tablet (160 mg total) by mouth daily. 90 tablet 0   No current facility-administered medications on file prior to visit.    Allergies  Allergen Reactions   Penicillins Rash    Has patient had a PCN reaction causing immediate rash, facial/tongue/throat swelling, SOB or lightheadedness with hypotension: YES Has patient had a PCN reaction causing severe rash involving mucus membranes or skin necrosis: NO Has patient had a PCN reaction that required hospitalization  NO Has patient had a PCN reaction occurring within the last 10 years:  NO     Social History   Socioeconomic History   Marital status: Single    Spouse name: Not on file   Number of children: Not on file   Years of education: Not on file   Highest education level: Not on file  Occupational History   Not on file  Tobacco Use   Smoking status: Former    Packs/day: 0.50    Years: 3.00    Pack years: 1.50    Types: Cigarettes    Quit date: 02/26/2005    Years since quitting: 16.1   Smokeless tobacco: Never  Vaping Use   Vaping Use: Never used  Substance and Sexual Activity   Alcohol use: No   Drug use: No   Sexual activity:  Yes    Birth control/protection: None  Other Topics Concern   Not on file  Social History Narrative   Not on file   Social Determinants of Health   Financial Resource Strain: Not on file  Food Insecurity: Not on file  Transportation Needs: Not on file  Physical Activity: Not on file  Stress: Not on file  Social Connections: Not on file  Intimate Partner Violence: Not on file    Family History  Problem Relation Age of Onset   Cancer Maternal Aunt     Past Surgical History:  Procedure Laterality Date   APPENDECTOMY     Dec 2007   CESAREAN SECTION     May 2008, breech   CESAREAN SECTION WITH BILATERAL TUBAL LIGATION  N/A 12/17/2015   Procedure: CESAREAN SECTION WITH BILATERAL TUBAL LIGATION;  Surgeon: Vena Austria, MD;  Location: [facility] ORS;  Service: Obstetrics;  Laterality: N/A;    ROS: Review of Systems Negative except as stated above  PHYSICAL EXAM: There were no vitals taken for this visit.  Physical Exam  {female adult master:310786} {female adult master:310785}  CMP Latest Ref Rng & Units 12/23/2020 08/19/2020 08/06/2020  Glucose 70 - 99 mg/dL 664(Q) 88 89  BUN 6 - 20 mg/dL 11 10 9   Creatinine 0.44 - 1.00 mg/dL 0.34 7.42  Sodium 135 - 145 mmol/L 137 138 137  Potassium 3.5 - 5.1 mmol/L 3.5 4.3 3.5  Chloride 98 - 111 mmol/L 100 100 103  CO2 22 - 32 mmol/L 28 23 24   Calcium 8.9 - 10.3 mg/dL 8.9 9.0 9.4  Total Protein 6.0 - 8.5 g/dL - - -  Total Bilirubin 0.0 - 1.2 mg/dL - - -  Alkaline Phos 39 - 117 IU/L - - -  AST 0 - 40 IU/L - - -  ALT 0 - 32 IU/L - - -   Lipid Panel  No results found for: CHOL, TRIG, HDL, CHOLHDL, VLDL, LDLCALC, LDLDIRECT  CBC    Component Value Date/Time   WBC 9.0 12/23/2020 1006   RBC 5.17 (H) 12/23/2020 1006   HGB 11.1 (L) 12/23/2020 1006   HGB 10.7 (L) 08/19/2020 1101   HCT 35.7 (L) 12/23/2020 1006   HCT 35.2 08/19/2020 1101   PLT 396 12/23/2020 1006   PLT 411 08/19/2020 1101   MCV 69.1 (L) 12/23/2020 1006   MCV 71 (L) 08/19/2020 1101   MCV 76 (L) 11/21/2013 1315   MCH 21.5 (L) 12/23/2020 1006   MCHC 31.1 12/23/2020 1006   RDW 18.8 (H) 12/23/2020 1006   RDW 17.7 (H) 08/19/2020 1101   RDW 17.1 (H) 11/21/2013 1315   LYMPHSABS 4.1 (H) 08/04/2020 1537   MONOABS 1.1 (H) 08/04/2020 1537   EOSABS 0.2 08/04/2020 1537   BASOSABS 0.1 08/04/2020 1537    ASSESSMENT AND PLAN:  There are no diagnoses linked to this encounter.   Patient was given the opportunity to ask questions.  Patient verbalized understanding of the plan and was able to repeat key elements of the plan. Patient was given clear instructions to go to Emergency Department or return to  medical center if symptoms don't improve, worsen, or new problems develop.The patient verbalized understanding.   No orders of the defined types were placed in this encounter.    Requested Prescriptions    No prescriptions requested or ordered in this encounter    No follow-ups on file.  08/06/2020, NP

## 2021-04-27 ENCOUNTER — Encounter: Payer: Medicaid Other | Admitting: Family

## 2021-04-27 ENCOUNTER — Other Ambulatory Visit: Payer: Self-pay

## 2021-04-27 DIAGNOSIS — I10 Essential (primary) hypertension: Secondary | ICD-10-CM

## 2021-04-28 NOTE — Progress Notes (Signed)
? ? ?Patient ID: Yvette Wilson, female    DOB: September 07, 1980  MRN: [id number] ? ?CC: Hypertension Follow-Up ? ?Subjective: ?Yvette Wilson is a 41 y.o. female who presents for hypertension follow-up.  ? ?Her concerns today include:  ?HYPERTENSION FOLLOW-UP: ?09/07/2020: ?- Continue Valsartan and Amlodipine as prescribed.  ?- Increase Hydrochlorothiazide from 12.5 mg daily to 25 mg daily.  ? ?04/30/2021 at [facility] Emergency Department per DO note: ?MDM  ?This is a 41 y.o. female with a pertinent PMH of HTN who presents to the ED with gradual headache worsening from this morning with associated elevated BP and blurry vision in both eyes.  ?The differential of this patient includes but is not limited to hypertensive emergency, hypertension urgency, migraine, tension headache ?  ?My Impression, Plan, and ED Course: Here patient has elevated BP to 188/110. Doubt hypertensive emergency given the degree of hypertension here, but with HA and blurry vision will obtain head CT and labs to r/o end organ damage. Patient could be having migraine as well that is leading her BP to elevated as headache feels similar to previous migraines. I will give her a dose of her Losartan here and tx headache with toradol. Will reassess following labs.  ?  ?I personally ordered, reviewed, and interpreted all laboratory work and imaging and agree with radiologist interpretation. Results interpreted below: K 3.1, hgb 10.5, plt 440, troponin neg, CT with no acute abnormalities, chronic ethmoid sinusitis. EKG unchanged ?  ?Labs reveal hypokalemia, stable anemia, thrombocytosis which is chronic. No evidence of end organ damage.  ?  ?On reevaluation, BP is improved to 151/98. Headache and visual disturbance is also better after intervention. We discussed only refilling two of her BP medications to avoid bottoming out her BP since it has been so long since she has been on all three meds. She will f/u with PCP next week. I will give her two  week course to hold her over. Return precautions provided.   ? ?05/03/2021: ?Today reports feeling better. No longer having headaches. Reports when previously taking Valsartan, Amlodipine, Hydrochlorothiazide combination blood pressures well-controlled. Denies chest pain, shortness of breath additional red flag symptoms. No further issues/concerns. ? ? ?Patient Active Problem List  ? Diagnosis Date Noted  ? Essential hypertension 09/05/2020  ? Iron deficiency anemia 08/22/2020  ? Thrombocytopenia (HCC) 08/05/2020  ? Leukocytosis 08/05/2020  ? Bradycardia 08/05/2020  ? Hypertensive urgency 08/04/2020  ? Prediabetes 06/25/2019  ? Anxiety attack 07/07/2014  ?  ? ?Current Outpatient Medications on File Prior to Visit  ?Medication Sig Dispense Refill  ? cyclobenzaprine (FLEXERIL) 10 MG tablet Take 1 tablet (10 mg total) by mouth 3 (three) times daily as needed for muscle spasms. 21 tablet 0  ? Iron, Ferrous Sulfate, 325 (65 Fe) MG TABS Take 325 mg by mouth 3 (three) times a week. Take 325 mg (1 tablet total) on Monday, Wednesday, and Friday. 50 tablet 0  ? omeprazole (PRILOSEC) 20 MG capsule Take 1 capsule (20 mg total) by mouth daily. 90 capsule 0  ? ?No current facility-administered medications on file prior to visit.  ? ? ?Allergies  ?Allergen Reactions  ? Penicillins Rash  ?  Has patient had a PCN reaction causing immediate rash, facial/tongue/throat swelling, SOB or lightheadedness with hypotension: YES ?Has patient had a PCN reaction causing severe rash involving mucus membranes or skin necrosis: ?NO ?Has patient had a PCN reaction that required hospitalization  ?NO ?Has patient had a PCN reaction occurring within the last 10  years:  ?NO ?  ? ? ?Social History  ? ?Socioeconomic History  ? Marital status: Single  ?  Spouse name: Not on file  ? Number of children: Not on file  ? Years of education: Not on file  ? Highest education level: Not on file  ?Occupational History  ? Not on file  ?Tobacco Use  ? Smoking  status: Former  ?  Packs/day: 0.50  ?  Years: 3.00  ?  Pack years: 1.50  ?  Types: Cigarettes  ?  Quit date: 02/26/2005  ?  Years since quitting: 16.1  ? Smokeless tobacco: Never  ?Vaping Use  ? Vaping Use: Never used  ?Substance and Sexual Activity  ? Alcohol use: No  ? Drug use: No  ? Sexual activity: Yes  ?  Birth control/protection: None  ?Other Topics Concern  ? Not on file  ?Social History Narrative  ? Not on file  ? ?Social Determinants of Health  ? ?Financial Resource Strain: Not on file  ?Food Insecurity: Not on file  ?Transportation Needs: Not on file  ?Physical Activity: Not on file  ?Stress: Not on file  ?Social Connections: Not on file  ?Intimate Partner Violence: Not on file  ? ? ?Family History  ?Problem Relation Age of Onset  ? Cancer Maternal Aunt   ? ? ?Past Surgical History:  ?Procedure Laterality Date  ? APPENDECTOMY    ? Dec 2007  ? CESAREAN SECTION    ? May 2008, breech  ? CESAREAN SECTION WITH BILATERAL TUBAL LIGATION N/A 12/17/2015  ? Procedure: CESAREAN SECTION WITH BILATERAL TUBAL LIGATION;  Surgeon: Vena AustriaAndreas Staebler, MD;  Location: [facility] ORS;  Service: Obstetrics;  Laterality: N/A;  ? ? ?ROS: ?Review of Systems ?Negative except as stated above ? ?PHYSICAL EXAM: ?BP 129/87 (BP Location: Left Arm, Patient Position: Sitting, Cuff Size: Large)   Pulse 75   Temp 98.3 ?F (36.8 ?C)   Resp 18   Ht 5' 6.85" (1.698 m)   Wt 300 lb (136.1 kg)   SpO2 99%   BMI 47.20 kg/m?  ? ?Physical Exam ?HENT:  ?   Head: Normocephalic and atraumatic.  ?Eyes:  ?   Extraocular Movements: Extraocular movements intact.  ?   Conjunctiva/sclera: Conjunctivae normal.  ?   Pupils: Pupils are equal, round, and reactive to light.  ?Cardiovascular:  ?   Rate and Rhythm: Normal rate and regular rhythm.  ?   Pulses: Normal pulses.  ?   Heart sounds: Normal heart sounds. No murmur heard. ?Pulmonary:  ?   Effort: Pulmonary effort is normal.  ?   Breath sounds: Normal breath sounds.  ?Musculoskeletal:  ?   Cervical back: Normal  range of motion and neck supple.  ?Neurological:  ?   General: No focal deficit present.  ?   Mental Status: She is alert and oriented to person, place, and time.  ?Psychiatric:     ?   Mood and Affect: Mood normal.     ?   Behavior: Behavior normal.  ? ? ?ASSESSMENT AND PLAN: ?1. Essential (primary) hypertension: ?- Continue Valsartan and Hydrochlorothiazide as prescribed.  ?- Resume Amlodipine as prescribed.  ?- Counseled on blood pressure goal of less than 130/80, low-sodium, DASH diet, medication compliance, and 150 minutes of moderate intensity exercise per week as tolerated. Counseled on medication adherence and adverse effects. ?- Encouraged to continue to check blood pressures routinely at home and if having any low blood pressure readings to notify primary provider and seek  immediate medical assistance. Patient verbalized understanding.  ?- Update BMP.  ?- Follow-up with primary provider in 3 months or sooner if needed.  ?- Basic Metabolic Panel ?- valsartan (DIOVAN) 160 MG tablet; Take 1 tablet (160 mg total) by mouth daily.  Dispense: 90 tablet; Refill: 0 ?- amLODipine (NORVASC) 10 MG tablet; Take 1 tablet (10 mg total) by mouth daily.  Dispense: 90 tablet; Refill: 0 ?- hydrochlorothiazide (HYDRODIURIL) 25 MG tablet; Take 1 tablet (25 mg total) by mouth daily.  Dispense: 90 tablet; Refill: 0 ? ? ?Patient was given the opportunity to ask questions.  Patient verbalized understanding of the plan and was able to repeat key elements of the plan. Patient was given clear instructions to go to Emergency Department or return to medical center if symptoms don't improve, worsen, or new problems develop.The patient verbalized understanding. ? ? ?Orders Placed This Encounter  ?Procedures  ? Basic Metabolic Panel  ? ? ? ?Requested Prescriptions  ? ?Signed Prescriptions Disp Refills  ? valsartan (DIOVAN) 160 MG tablet 90 tablet 0  ?  Sig: Take 1 tablet (160 mg total) by mouth daily.  ? amLODipine (NORVASC) 10 MG tablet  90 tablet 0  ?  Sig: Take 1 tablet (10 mg total) by mouth daily.  ? hydrochlorothiazide (HYDRODIURIL) 25 MG tablet 90 tablet 0  ?  Sig: Take 1 tablet (25 mg total) by mouth daily.  ? ? ?Return in about 3 months (aro

## 2021-04-30 ENCOUNTER — Emergency Department (HOSPITAL_COMMUNITY)
Admission: EM | Admit: 2021-04-30 | Discharge: 2021-04-30 | Disposition: A | Discharge status: Home / Self Care | Payer: Medicaid Other | Attending: Emergency Medicine | Admitting: Emergency Medicine

## 2021-04-30 ENCOUNTER — Emergency Department (HOSPITAL_COMMUNITY): Payer: Medicaid Other

## 2021-04-30 ENCOUNTER — Ambulatory Visit: Payer: Self-pay | Admitting: *Deleted

## 2021-04-30 ENCOUNTER — Encounter (HOSPITAL_COMMUNITY): Payer: Self-pay | Admitting: Emergency Medicine

## 2021-04-30 DIAGNOSIS — G44201 Tension-type headache, unspecified, intractable: Secondary | ICD-10-CM

## 2021-04-30 DIAGNOSIS — I10 Essential (primary) hypertension: Secondary | ICD-10-CM | POA: Insufficient documentation

## 2021-04-30 DIAGNOSIS — Z79899 Other long term (current) drug therapy: Secondary | ICD-10-CM | POA: Insufficient documentation

## 2021-04-30 DIAGNOSIS — G44209 Tension-type headache, unspecified, not intractable: Secondary | ICD-10-CM | POA: Diagnosis not present

## 2021-04-30 DIAGNOSIS — R519 Headache, unspecified: Secondary | ICD-10-CM | POA: Diagnosis present

## 2021-04-30 LAB — CBC WITH DIFFERENTIAL/PLATELET
Abs Immature Granulocytes: 0.02 10*3/uL (ref 0.00–0.07)
Basophils Absolute: 0.1 10*3/uL (ref 0.0–0.1)
Basophils Relative: 1 %
Eosinophils Absolute: 0.3 10*3/uL (ref 0.0–0.5)
Eosinophils Relative: 4 %
HCT: 33.3 % — ABNORMAL LOW (ref 36.0–46.0)
Hemoglobin: 10.5 g/dL — ABNORMAL LOW (ref 12.0–15.0)
Immature Granulocytes: 0 %
Lymphocytes Relative: 36 %
Lymphs Abs: 2.8 10*3/uL (ref 0.7–4.0)
MCH: 22.3 pg — ABNORMAL LOW (ref 26.0–34.0)
MCHC: 31.5 g/dL (ref 30.0–36.0)
MCV: 70.9 fL — ABNORMAL LOW (ref 80.0–100.0)
Monocytes Absolute: 0.6 10*3/uL (ref 0.1–1.0)
Monocytes Relative: 8 %
Neutro Abs: 3.9 10*3/uL (ref 1.7–7.7)
Neutrophils Relative %: 51 %
Platelets: 440 10*3/uL — ABNORMAL HIGH (ref 150–400)
RBC: 4.7 MIL/uL (ref 3.87–5.11)
RDW: 18.3 % — ABNORMAL HIGH (ref 11.5–15.5)
WBC: 7.8 10*3/uL (ref 4.0–10.5)
nRBC: 0 % (ref 0.0–0.2)

## 2021-04-30 LAB — COMPREHENSIVE METABOLIC PANEL
ALT: 15 U/L (ref 0–44)
AST: 13 U/L — ABNORMAL LOW (ref 15–41)
Albumin: 4 g/dL (ref 3.5–5.0)
Alkaline Phosphatase: 51 U/L (ref 38–126)
Anion gap: 7 (ref 5–15)
BUN: 11 mg/dL (ref 6–20)
CO2: 26 mmol/L (ref 22–32)
Calcium: 8.5 mg/dL — ABNORMAL LOW (ref 8.9–10.3)
Chloride: 102 mmol/L (ref 98–111)
Creatinine, Ser: 0.85 mg/dL (ref 0.44–1.00)
GFR, Estimated: >60 mL/min (ref >60)
Glucose, Bld: 95 mg/dL (ref 70–99)
Potassium: 3.1 mmol/L — ABNORMAL LOW (ref 3.5–5.1)
Sodium: 135 mmol/L (ref 135–145)
Total Bilirubin: 0.3 mg/dL (ref 0.3–1.2)
Total Protein: 8.1 g/dL (ref 6.5–8.1)

## 2021-04-30 LAB — TROPONIN I (HIGH SENSITIVITY): Troponin I (High Sensitivity): 3 ng/L (ref <18)

## 2021-04-30 MED ORDER — VALSARTAN 160 MG PO TABS: 160.0000 mg | ORAL_TABLET | Freq: Every day | ORAL | 0 refills | Status: DC

## 2021-04-30 MED ORDER — IRBESARTAN 150 MG PO TABS
150.0000 mg | ORAL_TABLET | Freq: Every day | ORAL | Status: DC
  Administered 2021-04-30: 150 mg via ORAL
  Filled 2021-04-30: qty 1

## 2021-04-30 MED ORDER — KETOROLAC TROMETHAMINE 30 MG/ML IJ SOLN
30.0000 mg | Freq: Once | INTRAMUSCULAR | Status: AC
  Administered 2021-04-30: 30 mg via INTRAVENOUS
  Filled 2021-04-30: qty 1

## 2021-04-30 MED ORDER — HYDROCHLOROTHIAZIDE 25 MG PO TABS: 25.0000 mg | ORAL_TABLET | Freq: Every day | ORAL | 0 refills | Status: DC

## 2021-04-30 NOTE — ED Provider Notes (Signed)
?[facility]-EMERGENCY DEPT ?Provider Note ? ? ?CSN: [id number] ?Arrival date & time: 04/30/21  1209 ? ?  ? ?History ?PMH: HTN, Prediabetes ?Chief Complaint  ?Patient presents with  ? Hypertension  ? ? ?Yvette Wilson is a 41 y.o. female.  Patient presents with a chief complaint of headache. Headache is in frontal head. Feels like something is stabbing her. Onset was upon waking up this morning. She has associated blurry vision and elevated blood pressure. She states that she overall feels very unwell. She has not taken medication for her headache but does endorse that these symptoms happen sometimes when her BP gets too high.  She states that she ran out of her BP medications three weeks ago and her PCP would not refill them until she got in to see them. She has an appt next week. She called them today regarding her symptoms and her BP was 180/115 at home. They referred her to the ED for consideration of IV medication. She denies any chest pain, shortness of breath, nausea, vomiting, abdominal pain, numbness, weakness, speech changes, dizziness, or gait abnormalities ? ? ?Hypertension ?Associated symptoms include headaches. Pertinent negatives include no chest pain, no abdominal pain and no shortness of breath.  ? ?  ? ?Home Medications ?Prior to Admission medications   ?Medication Sig Start Date End Date Taking? Authorizing Provider  ?amLODipine (NORVASC) 10 MG tablet Take 1 tablet (10 mg total) by mouth daily. 09/07/20 12/06/20  Rema Fendt, NP  ?cyclobenzaprine (FLEXERIL) 10 MG tablet Take 1 tablet (10 mg total) by mouth 3 (three) times daily as needed for muscle spasms. 01/20/21   Shaune Pollack, MD  ?docusate sodium (COLACE) 100 MG capsule Take 1 capsule (100 mg total) by mouth 2 (two) times daily as needed for mild constipation. 09/07/20   Rema Fendt, NP  ?hydrochlorothiazide (HYDRODIURIL) 25 MG tablet Take 1 tablet (25 mg total) by mouth daily for 14 days. 04/30/21 05/14/21  Rayson Rando,  Finis Bud, PA-C  ?HYDROcodone-acetaminophen (NORCO/VICODIN) 5-325 MG tablet Take 1-2 tablets by mouth every 6 (six) hours as needed for severe pain. 01/20/21 01/20/22  Shaune Pollack, MD  ?Iron, Ferrous Sulfate, 325 (65 Fe) MG TABS Take 325 mg by mouth 3 (three) times a week. Take 325 mg (1 tablet total) on Monday, Wednesday, and Friday. 08/24/20 12/22/20  Rema Fendt, NP  ?lidocaine (LIDODERM) 5 % Place 1 patch onto the skin daily. Remove & Discard patch within 12 hours or as directed by MD 02/19/21   Georga Hacking, MD  ?methylPREDNISolone (MEDROL DOSEPAK) 4 MG TBPK tablet Take per Dosepak instructions. 09/09/20   Arby Barrette, MD  ?omeprazole (PRILOSEC) 20 MG capsule Take 1 capsule (20 mg total) by mouth daily. 08/19/20 11/17/20  Rema Fendt, NP  ?oxyCODONE-acetaminophen (PERCOCET) 5-325 MG tablet Take 1-2 tablets by mouth every 4 (four) hours as needed. 09/09/20   Arby Barrette, MD  ?valsartan (DIOVAN) 160 MG tablet Take 1 tablet (160 mg total) by mouth daily for 14 days. 04/30/21 05/14/21  Rosaura Bolon, Finis Bud, PA-C  ?   ? ?Allergies    ?Penicillins   ? ?Review of Systems   ?Review of Systems  ?Constitutional:  Negative for chills and fever.  ?HENT:  Negative for congestion, rhinorrhea and sore throat.   ?Eyes:  Positive for photophobia and visual disturbance.  ?Respiratory:  Negative for shortness of breath.   ?Cardiovascular:  Negative for chest pain and leg swelling.  ?Gastrointestinal:  Negative for abdominal pain, nausea  and vomiting.  ?Neurological:  Positive for headaches. Negative for dizziness, tremors, seizures, syncope, facial asymmetry, speech difficulty, weakness, light-headedness and numbness.  ?All other systems reviewed and are negative. ? ?Physical Exam ?Updated Vital Signs ?BP (!) 151/98   Pulse 65   Temp 98.6 ?F (37 ?C) (Oral)   Resp 16   SpO2 99%  ?Physical Exam ?Vitals and nursing note reviewed.  ?Constitutional:   ?   General: She is not in acute distress. ?   Appearance: Normal  appearance. She is not ill-appearing, toxic-appearing or diaphoretic.  ?HENT:  ?   Head: Normocephalic and atraumatic.  ?   Nose: No nasal deformity.  ?   Mouth/Throat:  ?   Lips: Pink. No lesions.  ?   Mouth: Mucous membranes are moist. No injury, lacerations, oral lesions or angioedema.  ?   Pharynx: Oropharynx is clear. Uvula midline. No pharyngeal swelling, oropharyngeal exudate, posterior oropharyngeal erythema or uvula swelling.  ?Eyes:  ?   General: Gaze aligned appropriately. No scleral icterus.    ?   Right eye: No discharge.     ?   Left eye: No discharge.  ?   Conjunctiva/sclera: Conjunctivae normal.  ?   Right eye: Right conjunctiva is not injected. No exudate or hemorrhage. ?   Left eye: Left conjunctiva is not injected. No exudate or hemorrhage. ?   Pupils: Pupils are equal, round, and reactive to light.  ?Cardiovascular:  ?   Rate and Rhythm: Normal rate and regular rhythm.  ?   Pulses: Normal pulses.     ?     Radial pulses are 2+ on the right side and 2+ on the left side.  ?     Dorsalis pedis pulses are 2+ on the right side and 2+ on the left side.  ?   Heart sounds: Normal heart sounds, S1 normal and S2 normal. Heart sounds not distant. No murmur heard. ?  No friction rub. No gallop. No S3 or S4 sounds.  ?Pulmonary:  ?   Effort: Pulmonary effort is normal. No accessory muscle usage or respiratory distress.  ?   Breath sounds: Normal breath sounds. No stridor. No wheezing, rhonchi or rales.  ?Chest:  ?   Chest wall: No tenderness.  ?Abdominal:  ?   General: Abdomen is flat. Bowel sounds are normal. There is no distension.  ?   Palpations: Abdomen is soft. There is no mass or pulsatile mass.  ?   Tenderness: There is no abdominal tenderness. There is no guarding or rebound.  ?Musculoskeletal:  ?   Right lower leg: No edema.  ?   Left lower leg: No edema.  ?Skin: ?   General: Skin is warm and dry.  ?   Coloration: Skin is not jaundiced or pale.  ?   Findings: No bruising, erythema, lesion or rash.   ?Neurological:  ?   General: No focal deficit present.  ?   Mental Status: She is alert and oriented to person, place, and time.  ?   GCS: GCS eye subscore is 4. GCS verbal subscore is 5. GCS motor subscore is 6.  ?   Comments: Alert and Oriented x 3 ?Speech clear with no aphasia ?Cranial Nerve testing ?- PERRLA. EOM intact. No Nystagmus ?- Facial Sensation grossly intact ?- No facial asymmetry ?- Uvula and Tongue Midline ?- Accessory Muscles intact ?Motor: ?- 5/5 motor strength in all four extremities.  ?Sensation: ?- Grossly intact in all four extremities.  ?Coordination:  ?-  Finger to nose and heel to shin intact bilaterally ?- Gait without abnormality. ?  ?Psychiatric:     ?   Mood and Affect: Mood normal.     ?   Behavior: Behavior normal. Behavior is cooperative.  ? ? ?ED Results / Procedures / Treatments   ?Labs ?(all labs ordered are listed, but only abnormal results are displayed) ?Labs Reviewed  ?COMPREHENSIVE METABOLIC PANEL - Abnormal; Notable for the following components:  ?    Result Value  ? Potassium 3.1 (*)   ? Calcium 8.5 (*)   ? AST 13 (*)   ? All other components within normal limits  ?CBC WITH DIFFERENTIAL/PLATELET - Abnormal; Notable for the following components:  ? Hemoglobin 10.5 (*)   ? HCT 33.3 (*)   ? MCV 70.9 (*)   ? MCH 22.3 (*)   ? RDW 18.3 (*)   ? Platelets 440 (*)   ? All other components within normal limits  ?TROPONIN I (HIGH SENSITIVITY)  ? ? ?EKG ?None ? ?Radiology ?CT Head Wo Contrast ? ?Result Date: 04/30/2021 ?CLINICAL DATA:  Headaches EXAM: CT HEAD WITHOUT CONTRAST TECHNIQUE: Contiguous axial images were obtained from the base of the skull through the vertex without intravenous contrast. RADIATION DOSE REDUCTION: This exam was performed according to the departmental dose-optimization program which includes automated exposure control, adjustment of the mA and/or kV according to patient size and/or use of iterative reconstruction technique. COMPARISON:  08/04/2020 FINDINGS:  Brain: No acute intracranial findings are seen. Ventricles are not dilated. There is no shift of midline structures. There are no epidural or subdural fluid collections. Vascular: Unremarkable Skull: Unremarkable. Si

## 2021-04-30 NOTE — ED Triage Notes (Signed)
Patient reports out of BP medications x3 weeks. Has scheduled PCP appt next week. Takes amlodipine, valsartan, and HCTZ. Reports headache today. Denies chest pain and SOB. ?

## 2021-04-30 NOTE — ED Notes (Signed)
Patient transported to CT 

## 2021-04-30 NOTE — Discharge Instructions (Signed)
Please follow up with your PCP to have your blood pressure rechecked and further management of your medications. I have only refilled two of your medications to avoid your blood pressure from getting too low. If you develop dizziness, lightheadedness, or weakness, this could be a sign that your blood pressure is low. If you check it and it is, you can return to the ED.  ?

## 2021-04-30 NOTE — Telephone Encounter (Signed)
?  Chief Complaint: HTN ?Symptoms: headache, sluggish ?Frequency: constant ?Pertinent Negatives: Patient denies fever ?Disposition: [x] ED /[] Urgent Care (no appt availability in office) / [] Appointment(In office/virtual)/ []  [facility]/ [] Home Care/ [] Refused Recommended Disposition /[] [facility] [place] [facility]/ []  Follow-up with PCP ?Additional Notes: BP 181/103 HR 70 while on phone with me. States out of meds because is waiting for appt which is 05/03/21. She is going to [facility] now and will keep her upcoming appt next week. ? ? ?Reason for Disposition ? [1] Systolic BP  >= 160 OR Diastolic >= 100 AND [2] cardiac or neurologic symptoms (e.g., chest pain, difficulty breathing, unsteady gait, blurred vision) ? ?Answer Assessment - Initial Assessment Questions ?1. BLOOD PRESSURE: "What is the blood pressure?" "Did you take at least two measurements 5 minutes apart?" 157/113 taking another one now which is 181/103 HR 70 ?2. ONSET: "When did you take your blood pressure?" This am ?3. HOW: "How did you obtain the blood pressure?" (e.g., visiting nurse, automatic home BP monitor) home monitor ?4. MEDICATIONS: "Are you taking any medications for blood pressure?" "Have you missed any doses recently?" Yes, not filled because needs appt. ?5. OTHER SYMPTOMS: "Do you have any symptoms?" (e.g., headache, chest pain, blurred vision, difficulty breathing, weakness) ?Headache, feel sluggish, heart feels fast. ? ?Protocols used: Blood Pressure - High-A-AH ? ?

## 2021-05-03 ENCOUNTER — Ambulatory Visit: Payer: Medicaid Other | Admitting: Family

## 2021-05-03 ENCOUNTER — Other Ambulatory Visit: Payer: Self-pay

## 2021-05-03 VITALS — BP 129/87 | HR 75 | Temp 98.3°F | Resp 18 | Ht 66.85 in | Wt 300.0 lb

## 2021-05-03 DIAGNOSIS — I10 Essential (primary) hypertension: Secondary | ICD-10-CM

## 2021-05-03 MED ORDER — HYDROCHLOROTHIAZIDE 25 MG PO TABS: 25.0000 mg | ORAL_TABLET | Freq: Every day | ORAL | 0 refills | Status: DC

## 2021-05-03 MED ORDER — AMLODIPINE BESYLATE 10 MG PO TABS: 10.0000 mg | ORAL_TABLET | Freq: Every day | ORAL | 0 refills | Status: DC

## 2021-05-03 MED ORDER — VALSARTAN 160 MG PO TABS: 160.0000 mg | ORAL_TABLET | Freq: Every day | ORAL | 0 refills | Status: DC

## 2021-05-03 NOTE — Progress Notes (Signed)
Pt presents for hypertension follow-up, needs refills on 3 BP meds valsartan, amlodipine and hctz ?

## 2021-07-26 NOTE — Progress Notes (Signed)
Erroneous encounter-disregard

## 2021-08-03 ENCOUNTER — Encounter: Payer: Medicaid Other | Admitting: Family

## 2021-08-03 DIAGNOSIS — I10 Essential (primary) hypertension: Secondary | ICD-10-CM

## 2021-08-20 ENCOUNTER — Ambulatory Visit
Admission: EM | Admit: 2021-08-20 | Discharge: 2021-08-20 | Disposition: A | Discharge status: Home / Self Care | Payer: Medicaid Other | Attending: Internal Medicine | Admitting: Internal Medicine

## 2021-08-20 DIAGNOSIS — M549 Dorsalgia, unspecified: Secondary | ICD-10-CM | POA: Diagnosis not present

## 2021-08-20 MED ORDER — NAPROXEN 375 MG PO TABS: 375.0000 mg | ORAL_TABLET | Freq: Two times a day (BID) | ORAL | 0 refills | Status: DC

## 2021-08-20 MED ORDER — KETOROLAC TROMETHAMINE 30 MG/ML IJ SOLN
30.0000 mg | Freq: Once | INTRAMUSCULAR | Status: AC
  Administered 2021-08-20: 30 mg via INTRAMUSCULAR

## 2021-08-20 MED ORDER — METHOCARBAMOL 500 MG PO TABS: 500.0000 mg | ORAL_TABLET | Freq: Every evening | ORAL | 0 refills | Status: DC | PRN

## 2021-08-20 NOTE — ED Triage Notes (Signed)
Pt c/o waking up yesterday morning and experiencing an unusual pain in left back. States last night it started radiating to left chest. States hard to take a deep breath, and feels like she is being stabbed when she swallows. Concerned she has pulled a muscle.

## 2021-08-20 NOTE — Discharge Instructions (Addendum)
Rest the affected painful area.  Needed pad use on a 20-minute on-20 minutes off cycle as needed.   As pain recedes, begin gentle stretching exercises slowly as tolerated.   Return to urgent care if symptoms persist.

## 2021-08-23 NOTE — ED Provider Notes (Signed)
[facility]    CSN: [id number] Arrival date & time: 08/20/21  1137      History   Chief Complaint Chief Complaint  Patient presents with   situation    HPI Yvette Wilson is a 41 y.o. female comes to the urgent care with 1 day history of left-sided upper back pain.  Onset was abrupt.  Pain is sharp and radiates to the left side of the chest.  No trauma to the back.  No falls.  Pain is aggravated by stretching and taking a deep breath.  Patient denies any relieving factors.  No shortness of breath, cough or wheezing.  Patient denies any calf pain or leg pain.  No birth control use.  No family history of blood clots.  No shortness of breath, cough or sputum production.   HPI  Past Medical History:  Diagnosis Date   Gestational diabetes    Gestational, diet treated   Gestational hypertension 11/09/2015   History of pre-eclampsia 1999   birth of first child   Hypertension    with all pregnancies, and then continued after last pregnancy   Sickle cell trait [facility])     Patient Active Problem List   Diagnosis Date Noted   Essential hypertension 09/05/2020   Iron deficiency anemia 08/22/2020   Thrombocytopenia (HCC) 08/05/2020   Leukocytosis 08/05/2020   Bradycardia 08/05/2020   Hypertensive urgency 08/04/2020   Prediabetes 06/25/2019   Anxiety attack 07/07/2014    Past Surgical History:  Procedure Laterality Date   APPENDECTOMY     Dec 2007   CESAREAN SECTION     May 2008, breech   CESAREAN SECTION WITH BILATERAL TUBAL LIGATION N/A 12/17/2015   Procedure: CESAREAN SECTION WITH BILATERAL TUBAL LIGATION;  Surgeon: Vena Austria, MD;  Location: [facility] ORS;  Service: Obstetrics;  Laterality: N/A;    OB History     Gravida  4   Para  4   Term  4   Preterm      AB      Living  4      SAB      IAB      Ectopic      Multiple  0   Live Births  1        Obstetric Comments  SVD x 2 and then c/s x 2 with BTL with last c/s          Home  Medications    Prior to Admission medications   Medication Sig Start Date End Date Taking? Authorizing Provider  methocarbamol (ROBAXIN) 500 MG tablet Take 1 tablet (500 mg total) by mouth at bedtime as needed for muscle spasms. 08/20/21  Yes Aniyiah Zell, Britta Mccreedy, MD  naproxen (NAPROSYN) 375 MG tablet Take 1 tablet (375 mg total) by mouth 2 (two) times daily. 08/20/21  Yes Tyriq Moragne, Britta Mccreedy, MD  amLODipine (NORVASC) 10 MG tablet Take 1 tablet (10 mg total) by mouth daily. 05/03/21 08/01/21  Rema Fendt, NP  hydrochlorothiazide (HYDRODIURIL) 25 MG tablet Take 1 tablet (25 mg total) by mouth daily. 05/03/21 08/01/21  Rema Fendt, NP  valsartan (DIOVAN) 160 MG tablet Take 1 tablet (160 mg total) by mouth daily. 05/03/21 08/01/21  Rema Fendt, NP    Family History Family History  Problem Relation Age of Onset   Cancer Maternal Aunt     Social History Social History   Tobacco Use   Smoking status: Former    Packs/day: 0.50  Years: 3.00    Total pack years: 1.50    Types: Cigarettes    Quit date: 02/26/2005    Years since quitting: 16.4   Smokeless tobacco: Never  Vaping Use   Vaping Use: Never used  Substance Use Topics   Alcohol use: No   Drug use: No     Allergies   Penicillins   Review of Systems Review of Systems  HENT: Negative.    Respiratory: Negative.    Cardiovascular:  Positive for chest pain.  Neurological: Negative.      Physical Exam Triage Vital Signs ED Triage Vitals [08/20/21 1158]  Enc Vitals Group     BP (!) 160/95     Pulse Rate 77     Resp 18     Temp 98 F (36.7 C)     Temp Source Oral     SpO2 100 %     Weight      Height      Head Circumference      Peak Flow      Pain Score 0     Pain Loc      Pain Edu?      Excl. in GC?    No data found.  Updated Vital Signs BP (!) 160/95 (BP Location: Left Arm)   Pulse 77   Temp 98 F (36.7 C) (Oral)   Resp 18   SpO2 100%   Breastfeeding No   Visual Acuity Right Eye Distance:    Left Eye Distance:   Bilateral Distance:    Right Eye Near:   Left Eye Near:    Bilateral Near:     Physical Exam Vitals and nursing note reviewed.  Constitutional:      General: She is not in acute distress.    Appearance: She is not ill-appearing.  HENT:     Right Ear: Tympanic membrane normal.     Left Ear: Tympanic membrane normal.  Cardiovascular:     Rate and Rhythm: Normal rate and regular rhythm.     Pulses: Normal pulses.     Heart sounds: Normal heart sounds.  Pulmonary:     Effort: Pulmonary effort is normal.     Breath sounds: Normal breath sounds. No wheezing or rhonchi.  Abdominal:     General: Bowel sounds are normal.     Palpations: Abdomen is soft.  Musculoskeletal:     Comments: Point tenderness over the left upper back area.  Full range of motion of the left shoulder.  No bruising noted.  No swelling or fluctuance.  Skin:    General: Skin is warm.     Findings: No bruising or erythema.  Neurological:     Mental Status: She is alert.      UC Treatments / Results  Labs (all labs ordered are listed, but only abnormal results are displayed) Labs Reviewed - No data to display  EKG   Radiology No results found.  Procedures Procedures (including critical care time)  Medications Ordered in UC Medications  ketorolac (TORADOL) 30 MG/ML injection 30 mg (30 mg Intramuscular Given 08/20/21 1242)    Initial Impression / Assessment and Plan / UC Course  I have reviewed the triage vital signs and the nursing notes.  Pertinent labs & imaging results that were available during my care of the patient were reviewed by me and considered in my medical decision making (see chart for details).     1.  Upper back pain on the left side:  Toradol 30 mg IM x1 dose. Naproxen 375 mg orally twice daily as needed for pain Gentle range of motion exercises Naproxen at bedtime as needed for muscle spasms-precautions given No indication for imaging at this  time Return precautions given. Final Clinical Impressions(s) / UC Diagnoses   Final diagnoses:  Upper back pain on left side     Discharge Instructions      Rest the affected painful area.  Needed pad use on a 20-minute on-20 minutes off cycle as needed.   As pain recedes, begin gentle stretching exercises slowly as tolerated.   Return to urgent care if symptoms persist.    ED Prescriptions     Medication Sig Dispense Auth. Provider   naproxen (NAPROSYN) 375 MG tablet Take 1 tablet (375 mg total) by mouth 2 (two) times daily. 20 tablet Tauri Ethington, Britta Mccreedy, MD   methocarbamol (ROBAXIN) 500 MG tablet Take 1 tablet (500 mg total) by mouth at bedtime as needed for muscle spasms. 20 tablet Burlin Mcnair, Britta Mccreedy, MD      PDMP not reviewed this encounter.   Merrilee Jansky, MD 08/23/21 [phone]

## 2021-10-12 NOTE — Progress Notes (Deleted)
Patient ID: Yvette Wilson, female    DOB: February 12, 1981  MRN: [id number]  CC: Chronic Care Management  Subjective: Yvette Wilson is a 41 y.o. female who presents for chronic care management.   Her concerns today include:  HTN  Amlodipine HCTZ Valsartan   Patient Active Problem List   Diagnosis Date Noted   Essential hypertension 09/05/2020   Iron deficiency anemia 08/22/2020   Thrombocytopenia (HCC) 08/05/2020   Leukocytosis 08/05/2020   Bradycardia 08/05/2020   Hypertensive urgency 08/04/2020   Prediabetes 06/25/2019   Anxiety attack 07/07/2014     Current Outpatient Medications on File Prior to Visit  Medication Sig Dispense Refill   amLODipine (NORVASC) 10 MG tablet Take 1 tablet (10 mg total) by mouth daily. 90 tablet 0   hydrochlorothiazide (HYDRODIURIL) 25 MG tablet Take 1 tablet (25 mg total) by mouth daily. 90 tablet 0   methocarbamol (ROBAXIN) 500 MG tablet Take 1 tablet (500 mg total) by mouth at bedtime as needed for muscle spasms. 20 tablet 0   naproxen (NAPROSYN) 375 MG tablet Take 1 tablet (375 mg total) by mouth 2 (two) times daily. 20 tablet 0   valsartan (DIOVAN) 160 MG tablet Take 1 tablet (160 mg total) by mouth daily. 90 tablet 0   No current facility-administered medications on file prior to visit.    Allergies  Allergen Reactions   Penicillins Rash    Has patient had a PCN reaction causing immediate rash, facial/tongue/throat swelling, SOB or lightheadedness with hypotension: YES Has patient had a PCN reaction causing severe rash involving mucus membranes or skin necrosis: NO Has patient had a PCN reaction that required hospitalization  NO Has patient had a PCN reaction occurring within the last 10 years:  NO     Social History   Socioeconomic History   Marital status: Single    Spouse name: Not on file   Number of children: Not on file   Years of education: Not on file   Highest education level: Not on file  Occupational History   Not  on file  Tobacco Use   Smoking status: Former    Packs/day: 0.50    Years: 3.00    Total pack years: 1.50    Types: Cigarettes    Quit date: 02/26/2005    Years since quitting: 16.6   Smokeless tobacco: Never  Vaping Use   Vaping Use: Never used  Substance and Sexual Activity   Alcohol use: No   Drug use: No   Sexual activity: Yes    Birth control/protection: None  Other Topics Concern   Not on file  Social History Narrative   Not on file   Social Determinants of Health   Financial Resource Strain: Not on file  Food Insecurity: Food Insecurity Present (06/20/2019)   Hunger Vital Sign    Worried About Running Out of Food in the Last Year: Sometimes true    Ran Out of Food in the Last Year: Never true  Transportation Needs: No Transportation Needs (06/20/2019)   PRAPARE - Administrator, Civil Service (Medical): No    Lack of Transportation (Non-Medical): No  Physical Activity: Not on file  Stress: Not on file  Social Connections: Not on file  Intimate Partner Violence: Not on file    Family History  Problem Relation Age of Onset   Cancer Maternal Aunt     Past Surgical History:  Procedure Laterality Date   APPENDECTOMY     Dec 2007  CESAREAN SECTION     May 2008, breech   CESAREAN SECTION WITH BILATERAL TUBAL LIGATION N/A 12/17/2015   Procedure: CESAREAN SECTION WITH BILATERAL TUBAL LIGATION;  Surgeon: Vena Austria, MD;  Location: [facility] ORS;  Service: Obstetrics;  Laterality: N/A;    ROS: Review of Systems Negative except as stated above  PHYSICAL EXAM: There were no vitals taken for this visit.  Physical Exam  {female adult master:310786} {female adult master:310785}     Latest Ref Rng & Units 04/30/2021    1:05 PM 12/23/2020   10:06 AM 08/19/2020   11:01 AM  CMP  Glucose 70 - 99 mg/dL 95  403  88   BUN 6 - 20 mg/dL 11  11  10    Creatinine 0.44 - 1.00 mg/dL  4.74  2.59   Sodium 135 - 145 mmol/L 135  137  138   Potassium 3.5 - 5.1  mmol/L 3.1  3.5  4.3   Chloride 98 - 111 mmol/L 102  100  100   CO2 22 - 32 mmol/L 26  28  23    Calcium 8.9 - 10.3 mg/dL 8.5  8.9  9.0   Total Protein 6.5 - 8.1 g/dL 8.1     Total Bilirubin 0.3 - 1.2 mg/dL 0.3     Alkaline Phos 38 - 126 U/L 51     AST 15 - 41 U/L 13     ALT 0 - 44 U/L 15      Lipid Panel  No results found for: "CHOL", "TRIG", "HDL", "CHOLHDL", "VLDL", "LDLCALC", "LDLDIRECT"  CBC    Component Value Date/Time   WBC 7.8 04/30/2021 1305   RBC 4.70 04/30/2021 1305   HGB 10.5 (L) 04/30/2021 1305   HGB 10.7 (L) 08/19/2020 1101   HCT 33.3 (L) 04/30/2021 1305   HCT 35.2 08/19/2020 1101   PLT 440 (H) 04/30/2021 1305   PLT 411 08/19/2020 1101   MCV 70.9 (L) 04/30/2021 1305   MCV 71 (L) 08/19/2020 1101   MCV 76 (L) 11/21/2013 1315   MCH 22.3 (L) 04/30/2021 1305   MCHC 31.5 04/30/2021 1305   RDW 18.3 (H) 04/30/2021 1305   RDW 17.7 (H) 08/19/2020 1101   RDW 17.1 (H) 11/21/2013 1315   LYMPHSABS 2.8 04/30/2021 1305   MONOABS 0.6 04/30/2021 1305   EOSABS 0.3 04/30/2021 1305   BASOSABS 0.1 04/30/2021 1305    ASSESSMENT AND PLAN:  There are no diagnoses linked to this encounter.   Patient was given the opportunity to ask questions.  Patient verbalized understanding of the plan and was able to repeat key elements of the plan. Patient was given clear instructions to go to Emergency Department or return to medical center if symptoms don't improve, worsen, or new problems develop.The patient verbalized understanding.   No orders of the defined types were placed in this encounter.    Requested Prescriptions    No prescriptions requested or ordered in this encounter    No follow-ups on file.  05/02/2021, NP

## 2021-10-20 ENCOUNTER — Ambulatory Visit: Payer: Medicaid Other | Admitting: Family

## 2021-10-20 DIAGNOSIS — I10 Essential (primary) hypertension: Secondary | ICD-10-CM

## 2021-10-26 ENCOUNTER — Other Ambulatory Visit: Payer: Self-pay

## 2021-10-26 ENCOUNTER — Emergency Department
Admission: EM | Admit: 2021-10-26 | Discharge: 2021-10-26 | Disposition: A | Discharge status: Home / Self Care | Payer: Medicaid Other | Attending: Emergency Medicine | Admitting: Emergency Medicine

## 2021-10-26 DIAGNOSIS — I10 Essential (primary) hypertension: Secondary | ICD-10-CM | POA: Insufficient documentation

## 2021-10-26 DIAGNOSIS — L02411 Cutaneous abscess of right axilla: Secondary | ICD-10-CM | POA: Diagnosis not present

## 2021-10-26 DIAGNOSIS — M7989 Other specified soft tissue disorders: Secondary | ICD-10-CM | POA: Diagnosis present

## 2021-10-26 MED ORDER — HYDROCODONE-ACETAMINOPHEN 5-325 MG PO TABS: 1.0000 | ORAL_TABLET | Freq: Four times a day (QID) | ORAL | 0 refills | Status: AC | PRN

## 2021-10-26 MED ORDER — CLINDAMYCIN HCL 300 MG PO CAPS
300.0000 mg | ORAL_CAPSULE | Freq: Three times a day (TID) | ORAL | 0 refills | Status: AC
Start: 2021-10-26 — End: 2021-11-02

## 2021-10-26 NOTE — Discharge Instructions (Addendum)
Begin taking antibiotics as directed until completely finished.  The hydrocodone is a pain medication and could cause drowsiness therefore do not drive or operate machinery while taking this medication.  Use warm moist compresses to the area frequently.  When the area becomes soft as we discussed you may return to the emergency department for drainage if it has not opened up and drained on its own.

## 2021-10-26 NOTE — ED Triage Notes (Signed)
Ambulatory to triage with c/o hard abscess under right arm. First noticed this morning, started as slight soreness but has increased in severity and size. No drainage from area noted

## 2021-10-26 NOTE — Progress Notes (Signed)
Erroneous encounter-disregard

## 2021-10-26 NOTE — ED Provider Notes (Signed)
[facility] Provider Note    Event Date/Time   First MD Initiated Contact with Patient 10/26/21 [phone]     (approximate)   History   Abscess   HPI  Yvette Wilson is a 41 y.o. female   presents to the ED with complaint of a hard abscess under her right arm.  Patient noticed some soreness yesterday but then noticed a hard lump in her right axilla this morning with increased tenderness.  Patient denies any fever or chills.  No previous abscess.  Patient has a history of hypertension, gestational diabetes, and sickle cell trait.      Physical Exam   Triage Vital Signs: ED Triage Vitals [10/26/21 0151]  Enc Vitals Group     BP (!) 176/73     Pulse Rate 78     Resp 18     Temp 98.4 F (36.9 C)     Temp Source Oral     SpO2 99 %     Weight (!) 305 lb (138.3 kg)     Height 5\' 7"  (1.702 m)     Head Circumference      Peak Flow      Pain Score 10     Pain Loc      Pain Edu?      Excl. in GC?     Most recent vital signs: Vitals:   10/26/21 0151 10/26/21 0727  BP: (!) 176/73 (!) 170/70  Pulse: 78 80  Resp: 18 18  Temp: 98.4 F (36.9 C) 98 F (36.7 C)  SpO2: 99% 99%     General: Awake, no distress.  CV:  Good peripheral perfusion.  Resp:  Normal effort.  Abd:  No distention.  Other:  Right axilla there is a 2 cm area that is tender to palpation but no localized abscess.  Areas very firm and no erythema or cellulitis in the area.   ED Results / Procedures / Treatments   Labs (all labs ordered are listed, but only abnormal results are displayed) Labs Reviewed - No data to display    PROCEDURES:  Critical Care performed:   Procedures   MEDICATIONS ORDERED IN ED: Medications - No data to display   IMPRESSION / MDM / ASSESSMENT AND PLAN / ED COURSE  I reviewed the triage vital signs and the nursing notes.   Differential diagnosis includes, but is not limited to, cellulitis, abscess, pain axilla.  41 year old female presents  to the ED with a 1 day hard lump to the right axilla without previous history of abscesses.  Area is tender but not localized.  We discussed warm moist compresses frequently and begin taking antibiotics.  Patient is aware that in approximately 2 days this area may be soft and if not draining on his own she will need to return to the emergency department which time we will I&D it.  Given a prescription for clindamycin and hydrocodone.      Patient's presentation is most consistent with acute, uncomplicated illness.  FINAL CLINICAL IMPRESSION(S) / ED DIAGNOSES   Final diagnoses:  Abscess of axilla, right     Rx / DC Orders   ED Discharge Orders          Ordered    clindamycin (CLEOCIN) 300 MG capsule  3 times daily        10/26/21 0737    HYDROcodone-acetaminophen (NORCO/VICODIN) 5-325 MG tablet  Every 6 hours PRN        10/26/21 0737  Note:  This document was prepared using Dragon voice recognition software and may include unintentional dictation errors.   Tommi Rumps, PA-C 10/26/21 0258    Merwyn Katos, MD 10/26/21 [phone]

## 2021-10-26 NOTE — ED Notes (Signed)
See triage note States she developed a small area under left arm couple of days ago  Increased pain this am

## 2021-10-29 ENCOUNTER — Emergency Department
Admission: EM | Admit: 2021-10-29 | Discharge: 2021-10-29 | Disposition: A | Discharge status: Home / Self Care | Payer: Medicaid Other | Attending: Emergency Medicine | Admitting: Emergency Medicine

## 2021-10-29 ENCOUNTER — Other Ambulatory Visit: Payer: Self-pay

## 2021-10-29 ENCOUNTER — Other Ambulatory Visit: Payer: Self-pay | Admitting: Family

## 2021-10-29 DIAGNOSIS — L02413 Cutaneous abscess of right upper limb: Secondary | ICD-10-CM | POA: Insufficient documentation

## 2021-10-29 DIAGNOSIS — I10 Essential (primary) hypertension: Secondary | ICD-10-CM | POA: Diagnosis not present

## 2021-10-29 DIAGNOSIS — M7989 Other specified soft tissue disorders: Secondary | ICD-10-CM | POA: Diagnosis present

## 2021-10-29 DIAGNOSIS — L0291 Cutaneous abscess, unspecified: Secondary | ICD-10-CM

## 2021-10-29 MED ORDER — MELOXICAM 15 MG PO TABS: 15.0000 mg | ORAL_TABLET | Freq: Every day | ORAL | 0 refills | Status: AC

## 2021-10-29 MED ORDER — LIDOCAINE-EPINEPHRINE (PF) 2 %-1:200000 IJ SOLN
10.0000 mL | Freq: Once | INTRAMUSCULAR | Status: AC
  Administered 2021-10-29: 10 mL
  Filled 2021-10-29: qty 20

## 2021-10-29 NOTE — ED Notes (Signed)
See triage note   Presents with possible abscess area under left arm  Was seen couple of days ago  Given antibiotics and has been using war compresses  Feels like area is getting larger  and having increased pain

## 2021-10-29 NOTE — Discharge Instructions (Addendum)
-  The abscess will continue to drain over the next 24 to 48 hours.  You may continue to wash in the shower with soap and water.  -Please continue taking your clindamycin as previously prescribed.  -You may take the meloxicam as needed for pain.  If needed, you may additionally take 1000 mg acetaminophen/Tylenol every 6 hours.  -Return to the emergency department anytime if you begin to experience any new or worsening symptoms.

## 2021-10-29 NOTE — ED Triage Notes (Signed)
Right armpit wound check was seen here for it on Tuesday

## 2021-10-29 NOTE — ED Provider Notes (Signed)
[facility] Provider Note    Event Date/Time   First MD Initiated Contact with Patient 10/29/21 1513     (approximate)   History   Chief Complaint Lung Abcess and Wound Check (Seen here Tuesday armpit abcess , pt states its not improving )   HPI Yvette Wilson is a 41 y.o. female, history of hypertension, prediabetes, anxiety, presents to the emergency department for evaluation of armpit abscess on the right side.  She was seen here 3 days prior for the same issue.  She states this started approximately 4 days ago with some increased swelling and pain in the right axilla.  Reports some headaches, but otherwise no fever/chills.  No spontaneous drainage at this time.  She was given clindamycin prescription, which she has been compliant with, however it is not getting rid of the abscess and she reports some increase in size.  Denies chest pain, shortness of breath, abdominal pain, flank pain, nausea/vomiting, diarrhea, dizziness/lightheadedness, or dysuria.  History Limitations: No limitations.        Physical Exam  Triage Vital Signs: ED Triage Vitals  Enc Vitals Group     BP 10/29/21 1349 (!) 190/105     Pulse Rate 10/29/21 1349 66     Resp 10/29/21 1349 17     Temp 10/29/21 1349 98.5 F (36.9 C)     Temp Source 10/29/21 1349 Oral     SpO2 10/29/21 1349 98 %     Weight 10/29/21 1350 (!) 308 lb 10.3 oz (140 kg)     Height 10/29/21 1350 5\' 8"  (1.727 m)     Head Circumference --      Peak Flow --      Pain Score --      Pain Loc --      Pain Edu? --      Excl. in GC? --     Most recent vital signs: Vitals:   10/29/21 1349  BP: (!) 190/105  Pulse: 66  Resp: 17  Temp: 98.5 F (36.9 C)  SpO2: 98%    General: Awake, NAD.  Skin: Warm, dry. No rashes or lesions.  Eyes: PERRL. Conjunctivae normal.  CV: Good peripheral perfusion.  Resp: Normal effort.  Abd: Soft, non-tender. No distention.  Neuro: At baseline. No gross neurological deficits.   Musculoskeletal: Normal ROM of all extremities.   Focused Exam: Approximately 5 cm of soft tissue swelling/induration in the right axilla.  Approximately 1 cm in the middle appears to be fluctuant.  No spontaneous bleeding or drainage at this time.  No surrounding warmth or erythema.  Mildly tender to palpation.  Physical Exam    ED Results / Procedures / Treatments  Labs (all labs ordered are listed, but only abnormal results are displayed) Labs Reviewed - No data to display   EKG N/A.   RADIOLOGY  ED Provider Interpretation: N/A.  No results found.  PROCEDURES:  Critical Care performed: N/A.  10/31/21.Incision and Drainage  Date/Time: 10/29/2021 4:30 PM  Performed by: 10/31/2021, PA Authorized by: Varney Daily, PA   Consent:    Consent obtained:  Verbal   Consent given by:  Patient   Risks, benefits, and alternatives were discussed: yes     Risks discussed:  Bleeding, damage to other organs, incomplete drainage and pain   Alternatives discussed:  No treatment Universal protocol:    Patient identity confirmed:  Verbally with patient Location:    Type:  Abscess   Size:  2 cm   Location:  Upper extremity   Upper extremity location:  Arm   Arm location:  R upper arm Pre-procedure details:    Skin preparation:  Antiseptic wash Sedation:    Sedation type:  None Anesthesia:    Anesthesia method:  None Procedure type:    Complexity:  Simple Procedure details:    Ultrasound guidance: yes     Incision types:  Stab incision   Incision depth:  Dermal   Wound management:  Probed and deloculated   Drainage:  Purulent   Drainage amount:  Moderate   Wound treatment:  Wound left open   Packing materials:  None Post-procedure details:    Procedure completion:  Tolerated well, no immediate complications     MEDICATIONS ORDERED IN ED: Medications  lidocaine-EPINEPHrine (XYLOCAINE W/EPI) 2 %-1:200000 (PF) injection 10 mL (10 mLs Infiltration Given by  Other 10/29/21 1604)     IMPRESSION / MDM / ASSESSMENT AND PLAN / ED COURSE  I reviewed the triage vital signs and the nursing notes.                              Differential diagnosis includes, but is not limited to, cellulitis, abscess, hidradenitis, folliculitis.  Assessment/Plan Presentation consistent with axillary abscess.  History and physical exam does not show any signs of systemic infection.  She does have notable hypertension, consistent with her history, no tachycardia.  She is afebrile.  Medicare ultrasound does show approximately 1-2 cm diameter abscess in the middle of the soft tissue swelling.  Incision and drainage was performed with success.  See above for details.  Advised her to continue taking her clindamycin as prescribed and finish the entire course.  She states that the previous pain medication hydrocodone did not work for her due to the side effects.  We will provide her with a prescription for meloxicam.  Encouraged her to continue taking acetaminophen as well as needed.  Will discharge.  Provided the patient with anticipatory guidance, return precautions, and educational material. Encouraged the patient to return to the emergency department at any time if they begin to experience any new or worsening symptoms. Patient expressed understanding and agreed with the plan.   Patient's presentation is most consistent with acute, uncomplicated illness.       FINAL CLINICAL IMPRESSION(S) / ED DIAGNOSES   Final diagnoses:  Abscess     Rx / DC Orders   ED Discharge Orders          Ordered    meloxicam (MOBIC) 15 MG tablet  Daily        10/29/21 1629             Note:  This document was prepared using Dragon voice recognition software and may include unintentional dictation errors.   Varney Daily, [place] 10/29/21 1634    Phineas Semen, MD 10/29/21 1726

## 2021-11-09 ENCOUNTER — Encounter: Payer: Medicaid Other | Admitting: Family

## 2021-11-09 DIAGNOSIS — I10 Essential (primary) hypertension: Secondary | ICD-10-CM

## 2021-11-09 DIAGNOSIS — L02411 Cutaneous abscess of right axilla: Secondary | ICD-10-CM

## 2021-11-09 DIAGNOSIS — Z131 Encounter for screening for diabetes mellitus: Secondary | ICD-10-CM

## 2021-12-12 ENCOUNTER — Other Ambulatory Visit: Payer: Self-pay

## 2021-12-12 ENCOUNTER — Emergency Department
Admission: EM | Admit: 2021-12-12 | Discharge: 2021-12-12 | Disposition: A | Discharge status: Home / Self Care | Payer: Medicaid Other | Attending: Emergency Medicine | Admitting: Emergency Medicine

## 2021-12-12 ENCOUNTER — Encounter: Payer: Self-pay | Admitting: Intensive Care

## 2021-12-12 DIAGNOSIS — L0231 Cutaneous abscess of buttock: Secondary | ICD-10-CM | POA: Diagnosis present

## 2021-12-12 DIAGNOSIS — I10 Essential (primary) hypertension: Secondary | ICD-10-CM

## 2021-12-12 DIAGNOSIS — L0291 Cutaneous abscess, unspecified: Secondary | ICD-10-CM

## 2021-12-12 MED ORDER — LIDOCAINE HCL (PF) 1 % IJ SOLN
5.0000 mL | Freq: Once | INTRAMUSCULAR | Status: AC
Start: 2021-12-12 — End: 2021-12-12
  Administered 2021-12-12: 5 mL via INTRADERMAL
  Filled 2021-12-12: qty 5

## 2021-12-12 MED ORDER — AMLODIPINE BESYLATE 10 MG PO TABS: 10.0000 mg | ORAL_TABLET | Freq: Every day | ORAL | 0 refills | Status: AC

## 2021-12-12 MED ORDER — VALSARTAN 160 MG PO TABS: 160.0000 mg | ORAL_TABLET | Freq: Every day | ORAL | 0 refills | Status: AC

## 2021-12-12 MED ORDER — HYDROCHLOROTHIAZIDE 25 MG PO TABS: 25.0000 mg | ORAL_TABLET | Freq: Every day | ORAL | 0 refills | Status: AC

## 2021-12-12 MED ORDER — OXYCODONE-ACETAMINOPHEN 5-325 MG PO TABS
1.0000 | ORAL_TABLET | ORAL | Status: DC | PRN
  Administered 2021-12-12: 1 via ORAL
  Filled 2021-12-12 (×2): qty 1

## 2021-12-12 MED ORDER — DOXYCYCLINE HYCLATE 100 MG PO TABS: 100.0000 mg | ORAL_TABLET | Freq: Two times a day (BID) | ORAL | 0 refills | Status: DC

## 2021-12-12 MED ORDER — OXYCODONE-ACETAMINOPHEN 5-325 MG PO TABS: 1.0000 | ORAL_TABLET | ORAL | 0 refills | Status: AC | PRN

## 2021-12-12 MED ORDER — LIDOCAINE-EPINEPHRINE-TETRACAINE (LET) TOPICAL GEL
3.0000 mL | Freq: Once | TOPICAL | Status: AC
  Administered 2021-12-12: 3 mL via TOPICAL
  Filled 2021-12-12: qty 3

## 2021-12-12 NOTE — Discharge Instructions (Signed)
Follow-up with your regular doctor as needed.  Return emergency department worsening.  Take the antibiotic as prescribed.  If you are worsening you should return immediately.  If the packing falls out on its own wash the area well with soap and water daily.  It will heal from the bottom up.

## 2021-12-12 NOTE — ED Triage Notes (Signed)
Patient c/o abscess on left buttocks. Reports she has also been out of her blood pressure medication X1 month.

## 2021-12-12 NOTE — ED Provider Notes (Signed)
[facility] Provider Note    Event Date/Time   First MD Initiated Contact with Patient 12/12/21 1151     (approximate)   History   Abscess   HPI  Yvette Wilson is a 41 y.o. female with history of hypertension presents emergency department with abscess on the buttocks.  Areas been there for about a week.  Patient states she has soaked at least 45 times in the tub trying to help it go away.  Is currently out of her blood pressure medication.  Some low-grade fever noted overnight.      Physical Exam   Triage Vital Signs: ED Triage Vitals  Enc Vitals Group     BP 12/12/21 1022 (!) 198/115     Pulse Rate 12/12/21 1022 83     Resp 12/12/21 1022 18     Temp 12/12/21 1022 98 F (36.7 C)     Temp Source 12/12/21 1022 Oral     SpO2 12/12/21 1022 99 %     Weight 12/12/21 1023 295 lb (133.8 kg)     Height 12/12/21 1023 5\' 8"  (1.727 m)     Head Circumference --      Peak Flow --      Pain Score 12/12/21 1022 10     Pain Loc --      Pain Edu? --      Excl. in [place]? --     Most recent vital signs: Vitals:   12/12/21 1022 12/12/21 1329  BP: (!) 198/115 (!) 175/103  Pulse: 83 65  Resp: 18 16  Temp: 98 F (36.7 C)   SpO2: 99% 100%     General: Awake, no distress.   CV:  Good peripheral perfusion. regular rate and  rhythm Resp:  Normal effort.  Abd:  No distention.   Other:  Area on the left buttock near the top of the buttock is very swollen and tender, indurated, some clear drainage noted from the top but no open area for the abscess to drain.   ED Results / Procedures / Treatments   Labs (all labs ordered are listed, but only abnormal results are displayed) Labs Reviewed  AEROBIC CULTURE W GRAM STAIN (SUPERFICIAL SPECIMEN)     EKG     RADIOLOGY     PROCEDURES:   .Marland KitchenIncision and Drainage  Date/Time: 12/12/2021 2:15 PM  Performed by: Versie Starks, PA-C Authorized by: Versie Starks, PA-C   Consent:    Consent  obtained:  Verbal   Consent given by:  Patient   Risks, benefits, and alternatives were discussed: yes     Risks discussed:  Bleeding, incomplete drainage, pain, infection and damage to other organs   Alternatives discussed:  Delayed treatment Universal protocol:    Procedure explained and questions answered to patient or proxy's satisfaction: yes     Immediately prior to procedure, a time out was called: yes     Patient identity confirmed:  Verbally with patient Location:    Type:  Abscess Pre-procedure details:    Skin preparation:  Povidone-iodine Anesthesia:    Anesthesia method:  Topical application and local infiltration   Topical anesthetic:  LET   Local anesthetic:  Lidocaine 1% w/o epi Procedure type:    Complexity:  Simple Procedure details:    Incision types:  Stab incision   Wound management:  Probed and deloculated and irrigated with saline   Drainage:  Purulent and bloody   Drainage amount:  Copious  Wound treatment:  Drain placed   Packing materials:  1/4 in iodoform gauze Post-procedure details:    Procedure completion:  Tolerated well, no immediate complications Comments:     Abscess on the left buttock    MEDICATIONS ORDERED IN ED: Medications  oxyCODONE-acetaminophen (PERCOCET/ROXICET) 5-325 MG per tablet 1 tablet (1 tablet Oral Given 12/12/21 1027)  lidocaine-EPINEPHrine-tetracaine (LET) topical gel (3 mLs Topical Given 12/12/21 1212)  lidocaine (PF) (XYLOCAINE) 1 % injection 5 mL (5 mLs Intradermal Given 12/12/21 1240)     IMPRESSION / MDM / ASSESSMENT AND PLAN / ED COURSE  I reviewed the triage vital signs and the nursing notes.                              Differential diagnosis includes, but is not limited to, abscess, cellulitis, sepsis  Patient's presentation is most consistent with acute uncomplicated illness  Patient is afebrile here in the ED, do not feel that sepsis is likely.  However we will do incision and drainage of the  abscess  Incision and drainage performed, see procedure note.  Area was packed with iodoform.  Patient is to return in 2 days for packing removal or if the packing falls out she can wash gently with soap and water.  She was given a prescription for doxycycline and Percocet.  She is to follow-up with her regular doctor as needed.  Return if worsening     FINAL CLINICAL IMPRESSION(S) / ED DIAGNOSES   Final diagnoses:  Abscess     Rx / DC Orders   ED Discharge Orders          Ordered    doxycycline (VIBRA-TABS) 100 MG tablet  2 times daily        12/12/21 1304    oxyCODONE-acetaminophen (PERCOCET) 5-325 MG tablet  Every 4 hours PRN        12/12/21 1305    amLODipine (NORVASC) 10 MG tablet  Daily        12/12/21 1340    hydrochlorothiazide (HYDRODIURIL) 25 MG tablet  Daily        12/12/21 1340    valsartan (DIOVAN) 160 MG tablet  Daily        12/12/21 1340             Note:  This document was prepared using Dragon voice recognition software and may include unintentional dictation errors.    Versie Starks, PA-C 12/12/21 1416    Blake Divine, MD 12/12/21 [phone]

## 2021-12-12 NOTE — ED Notes (Signed)
Telfa dressing applied , covered with sterile gauze and taped to the skin. Patient tolerated procedure.

## 2021-12-12 NOTE — ED Notes (Addendum)
Patient asked for refill on blood pressure meds. Patient states she has been out for over a month. Ashok Cordia PA-C informed.

## 2021-12-15 LAB — AEROBIC CULTURE W GRAM STAIN (SUPERFICIAL SPECIMEN)

## 2022-01-11 ENCOUNTER — Other Ambulatory Visit: Payer: Self-pay

## 2022-01-11 ENCOUNTER — Encounter: Payer: Self-pay | Admitting: *Deleted

## 2022-01-11 ENCOUNTER — Emergency Department
Admission: EM | Admit: 2022-01-11 | Discharge: 2022-01-11 | Disposition: A | Discharge status: Home / Self Care | Payer: Medicaid Other | Attending: Student in an Organized Health Care Education/Training Program | Admitting: Student in an Organized Health Care Education/Training Program

## 2022-01-11 DIAGNOSIS — I10 Essential (primary) hypertension: Secondary | ICD-10-CM | POA: Diagnosis not present

## 2022-01-11 DIAGNOSIS — L02412 Cutaneous abscess of left axilla: Secondary | ICD-10-CM | POA: Diagnosis present

## 2022-01-11 DIAGNOSIS — L0291 Cutaneous abscess, unspecified: Secondary | ICD-10-CM

## 2022-01-11 MED ORDER — SULFAMETHOXAZOLE-TRIMETHOPRIM 800-160 MG PO TABS: 1.0000 | ORAL_TABLET | Freq: Two times a day (BID) | ORAL | 0 refills | Status: AC

## 2022-01-11 MED ORDER — LIDOCAINE HCL (PF) 1 % IJ SOLN
5.0000 mL | Freq: Once | INTRAMUSCULAR | Status: AC
  Administered 2022-01-11: 5 mL
  Filled 2022-01-11: qty 5

## 2022-01-11 MED ORDER — CEPHALEXIN 500 MG PO CAPS: 500.0000 mg | ORAL_CAPSULE | Freq: Four times a day (QID) | ORAL | 0 refills | Status: AC

## 2022-01-11 MED ORDER — CHLORHEXIDINE GLUCONATE 4 % EX LIQD
Freq: Every day | CUTANEOUS | 0 refills | Status: AC | PRN
Start: 2022-01-11 — End: ?

## 2022-01-11 NOTE — ED Provider Notes (Signed)
[facility] Provider Note    Event Date/Time   First MD Initiated Contact with Patient 01/11/22 2157     (approximate)   History   Breast Problem   HPI  Yvette Wilson is a 41 y.o. female who presents today for evaluation of abscess to her left axillary area.  Patient reports that she has had a known cyst in this area for many years and is never had a problem until the past 4 days.  She reports that it has gotten much larger and painful.  She reports that she had an abscess to her buttock last month and this feels the same.  No fevers or chills.  Patient Active Problem List   Diagnosis Date Noted   Essential hypertension 09/05/2020   Iron deficiency anemia 08/22/2020   Thrombocytopenia (HCC) 08/05/2020   Leukocytosis 08/05/2020   Bradycardia 08/05/2020   Hypertensive urgency 08/04/2020   Prediabetes 06/25/2019   Anxiety attack 07/07/2014         Physical Exam   Triage Vital Signs: ED Triage Vitals  Enc Vitals Group     BP 01/11/22 2142 (!) 143/91     Pulse Rate 01/11/22 2142 85     Resp 01/11/22 2142 18     Temp 01/11/22 2142 98.5 F (36.9 C)     Temp Source 01/11/22 2142 Oral     SpO2 01/11/22 2142 99 %     Weight 01/11/22 2139 295 lb (133.8 kg)     Height 01/11/22 2139 5\' 8"  (1.727 m)     Head Circumference --      Peak Flow --      Pain Score 01/11/22 2139 7     Pain Loc --      Pain Edu? --      Excl. in GC? --     Most recent vital signs: Vitals:   01/11/22 2142 01/11/22 2233  BP: (!) 143/91 136/89  Pulse: 85 72  Resp: 18 15  Temp: 98.5 F (36.9 C) 98.1 F (36.7 C)  SpO2: 99% 100%    Physical Exam Vitals and nursing note reviewed.  Constitutional:      General: Awake and alert. No acute distress.    Appearance: Normal appearance. The patient is normal weight.  HENT:     Head: Normocephalic and atraumatic.     Mouth: Mucous membranes are moist.  Eyes:     General: PERRL. Normal EOMs        Right eye: No  discharge.        Left eye: No discharge.     Conjunctiva/sclera: Conjunctivae normal.  Cardiovascular:     Rate and Rhythm: Normal rate and regular rhythm.     Pulses: Normal pulses.  Pulmonary:     Effort: Pulmonary effort is normal. No respiratory distress.     Breath sounds: Normal breath sounds.  Abdominal:     Abdomen is soft. There is no abdominal tenderness. Musculoskeletal:        General: No swelling. Normal range of motion.     Cervical back: Normal range of motion and neck supple.  Skin:    General: Skin is warm and dry.     Capillary Refill: Capillary refill takes less than 2 seconds.     Findings: 4 x 2 cm fluctuant mass in the left axilla with mild surrounding erythema.  Minimally tender to palpation. Neurological:     Mental Status: The patient is awake and alert.  ED Results / Procedures / Treatments   Labs (all labs ordered are listed, but only abnormal results are displayed) Labs Reviewed - No data to display   EKG     RADIOLOGY     PROCEDURES:  Critical Care performed:   Marland KitchenMarland KitchenIncision and Drainage  Date/Time: 01/11/2022 10:56 PM  Performed by: Marquette Old, PA-C Authorized by: Marquette Old, PA-C   Consent:    Consent obtained:  Verbal   Consent given by:  Patient   Risks discussed:  Bleeding, incomplete drainage, pain and damage to other organs   Alternatives discussed:  No treatment Universal protocol:    Procedure explained and questions answered to patient or proxy's satisfaction: yes     Relevant documents present and verified: yes     Test results available : yes     Imaging studies available: yes     Required blood products, implants, devices, and special equipment available: yes     Site/side marked: yes     Immediately prior to procedure, a time out was called: yes     Patient identity confirmed:  Verbally with patient Location:    Type:  Abscess Pre-procedure details:    Skin preparation:  Betadine Anesthesia:     Anesthesia method:  Local infiltration   Local anesthetic:  Lidocaine 1% WITH epi Procedure type:    Complexity:  Complex Procedure details:    Incision types:  Single straight   Incision depth:  Subcutaneous   Wound management:  Probed and deloculated, irrigated with saline and extensive cleaning   Drainage:  Purulent   Drainage amount:  Moderate   Wound treatment:  Drain placed   Packing materials:  1/4 in gauze Post-procedure details:    Procedure completion:  Tolerated well, no immediate complications    MEDICATIONS ORDERED IN ED: Medications  lidocaine (PF) (XYLOCAINE) 1 % injection 5 mL (5 mLs Infiltration Given 01/11/22 2211)     IMPRESSION / MDM / ASSESSMENT AND PLAN / ED COURSE  I reviewed the triage vital signs and the nursing notes.   Differential diagnosis includes, but is not limited to, abscess, cellulitis, cyst.  Patient is awake and alert, hemodynamically stable and afebrile.  She has got a large fluctuant area in her left axilla.  There is minimal surrounding erythema.  She agreed to I&D.  Wound was cleaned with iodine and then anesthetized with lidocaine.  Incision was made with copious purulent output.  Loculations were broken up and the pocket was irrigated.  A wick was placed.  We discussed with management and wound care.  She was started on antibiotics given the minimal surrounding erythema.  Patient understands and agrees with plan.  She was discharged in stable condition.   Patient's presentation is most consistent with acute complicated illness / injury requiring diagnostic workup.    FINAL CLINICAL IMPRESSION(S) / ED DIAGNOSES   Final diagnoses:  Abscess     Rx / DC Orders   ED Discharge Orders          Ordered    sulfamethoxazole-trimethoprim (BACTRIM DS) 800-160 MG tablet  2 times daily        01/11/22 2228    cephALEXin (KEFLEX) 500 MG capsule  4 times daily        01/11/22 2228    chlorhexidine (HIBICLENS) 4 % external liquid  Daily PRN         01/11/22 2229             Note:  This document was prepared using Dragon voice recognition software and may include unintentional dictation errors.   Emeline Gins 01/11/22 2258    Merlyn Lot, MD 01/11/22 [phone]

## 2022-01-11 NOTE — Discharge Instructions (Addendum)
Take antibiotics as prescribed.  You may also use the soap.  Your abscess was drained.  Remove the wick in 2 days.  Please return for any new, worsening, or change in symptoms or other concerns.  It was a pleasure caring for you today.

## 2022-01-11 NOTE — ED Triage Notes (Signed)
Pt has a cyst on left breast.  Pt has had the cyst for 5 years.  Last 5 days, cyst is like a bubble.  No drainage.  Area painful  pt alert.

## 2022-03-02 ENCOUNTER — Ambulatory Visit: Payer: Medicaid Other | Admitting: Family Medicine

## 2022-03-02 NOTE — Progress Notes (Deleted)
New patient visit   Patient: Yvette Wilson   DOB: May 13, 1980   42 y.o. Female  MRN: [id number] Visit Date: 03/02/2022  Today's healthcare provider: Eulis Foster, MD   No chief complaint on file.  Subjective    Yvette Wilson is a 42 y.o. female who presents today as a new patient to establish care.  HPI  ***  Past Medical History:  Diagnosis Date   Gestational diabetes    Gestational, diet treated   Gestational hypertension 11/09/2015   History of pre-eclampsia 1999   birth of first child   Hypertension    with all pregnancies, and then continued after last pregnancy   Sickle cell trait [facility])    Past Surgical History:  Procedure Laterality Date   APPENDECTOMY     Dec 2007   CESAREAN SECTION     May 2008, breech   CESAREAN SECTION WITH BILATERAL TUBAL LIGATION N/A 12/17/2015   Procedure: CESAREAN SECTION WITH BILATERAL TUBAL LIGATION;  Surgeon: Malachy Mood, MD;  Location: [facility] ORS;  Service: Obstetrics;  Laterality: N/A;   Family Status  Relation Name Status   Mother  Alive   Father  Alive   Daughter  Alive   Mat Aunt  Deceased   MGM  Deceased   MGF  Deceased   PGM  Deceased   PGF  Deceased   Family History  Problem Relation Age of Onset   Cancer Maternal Aunt    Social History   Socioeconomic History   Marital status: Single    Spouse name: Not on file   Number of children: Not on file   Years of education: Not on file   Highest education level: Not on file  Occupational History   Not on file  Tobacco Use   Smoking status: Former    Packs/day: 0.50    Years: 3.00    Total pack years: 1.50    Types: Cigarettes    Quit date: 02/26/2005    Years since quitting: 17.0   Smokeless tobacco: Never  Vaping Use   Vaping Use: Never used  Substance and Sexual Activity   Alcohol use: No   Drug use: No   Sexual activity: Yes    Birth control/protection: None  Other Topics Concern   Not on file  Social History Narrative   Not on  file   Social Determinants of Health   Financial Resource Strain: Not on file  Food Insecurity: Food Insecurity Present (06/20/2019)   Hunger Vital Sign    Worried About Running Out of Food in the Last Year: Sometimes true    Ran Out of Food in the Last Year: Never true  Transportation Needs: No Transportation Needs (06/20/2019)   PRAPARE - Hydrologist (Medical): No    Lack of Transportation (Non-Medical): No  Physical Activity: Not on file  Stress: Not on file  Social Connections: Not on file   Outpatient Medications Prior to Visit  Medication Sig   amLODipine (NORVASC) 10 MG tablet Take 1 tablet (10 mg total) by mouth daily.   chlorhexidine (HIBICLENS) 4 % external liquid Apply topically daily as needed.   hydrochlorothiazide (HYDRODIURIL) 25 MG tablet Take 1 tablet (25 mg total) by mouth daily.   HYDROcodone-acetaminophen (NORCO/VICODIN) 5-325 MG tablet Take 1 tablet by mouth every 6 (six) hours as needed.   oxyCODONE-acetaminophen (PERCOCET) 5-325 MG tablet Take 1 tablet by mouth every 4 (four) hours as needed for severe pain.  valsartan (DIOVAN) 160 MG tablet Take 1 tablet (160 mg total) by mouth daily.   No facility-administered medications prior to visit.   Allergies  Allergen Reactions   Penicillins Rash    Has patient had a PCN reaction causing immediate rash, facial/tongue/throat swelling, SOB or lightheadedness with hypotension: YES Has patient had a PCN reaction causing severe rash involving mucus membranes or skin necrosis: NO Has patient had a PCN reaction that required hospitalization  NO Has patient had a PCN reaction occurring within the last 10 years:  NO      There is no immunization history on file for this patient.  Health Maintenance  Topic Date Due   COVID-19 Vaccine (1) Never done   FOOT EXAM  Never done   OPHTHALMOLOGY EXAM  Never done   Hepatitis C Screening  Never done   DTaP/Tdap/Td (1 - Tdap) Never done    Diabetic kidney evaluation - Urine ACR  12/16/2016   HEMOGLOBIN A1C  12/21/2019   INFLUENZA VACCINE  Never done   Diabetic kidney evaluation - eGFR measurement  05/01/2022   PAP SMEAR-Modifier  06/20/2022   HIV Screening  Completed   HPV VACCINES  Aged Out    Patient Care Team: Camillia Herter, NP as PCP - General (Nurse Practitioner)  Review of Systems  {Labs  Heme  Chem  Endocrine  Serology  Results Review (optional):23779}   Objective    There were no vitals taken for this visit. {Show previous vital signs (optional):23777}  Physical Exam ***  Depression Screen    05/03/2021    1:34 PM 08/19/2020    9:36 AM 07/26/2019    8:29 AM 06/20/2019    5:28 PM  PHQ 2/9 Scores  PHQ - 2 Score 0 0 4 5  PHQ- 9 Score  0 14 15   No results found for any visits on 03/02/22.  Assessment & Plan     ***  No follow-ups on file.     {provider attestation***:1}   Eulis Foster, MD  [facility] [phone] (phone) [phone] (fax)  [place]

## 2022-11-10 IMAGING — CT CT HEAD W/O CM
3 series · 15 of 47 positions shown, 18 images · non-contrast
Comparison: 08/04/2020

CLINICAL DATA: Headaches



[Series 2: head wo · axial · 0.43mm/px · z∈[-102,+23]mm · 9 of 31 slices shown, 12 images]
[im 3/31  brain]
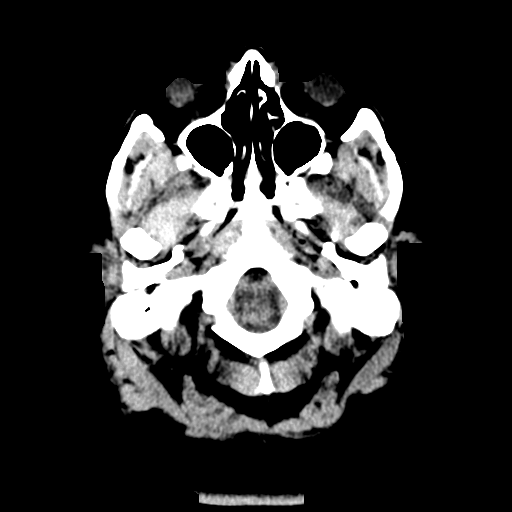
[im 3/31  bone]
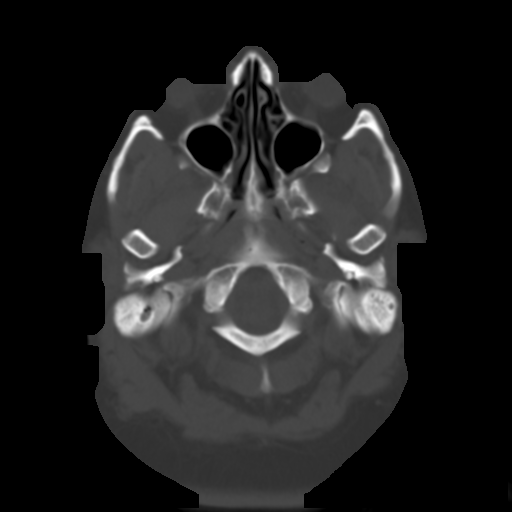
[im 6/31  brain]
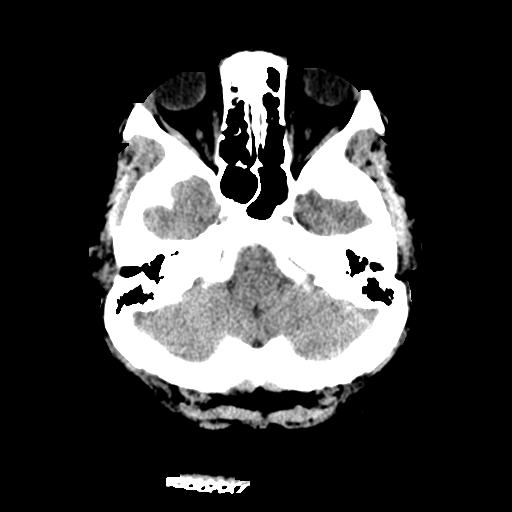
[im 9/31  brain]
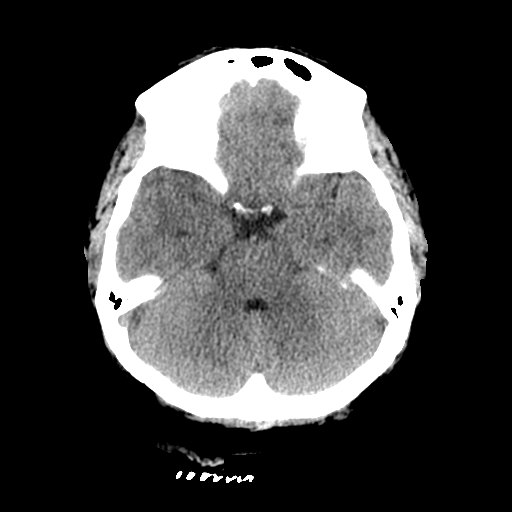
[im 12/31  brain]
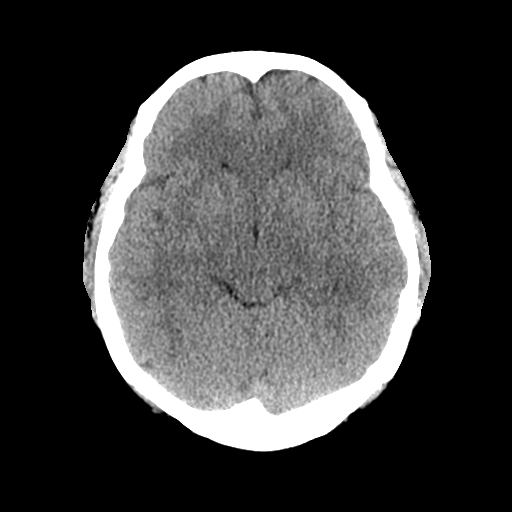
[im 16/31  brain]
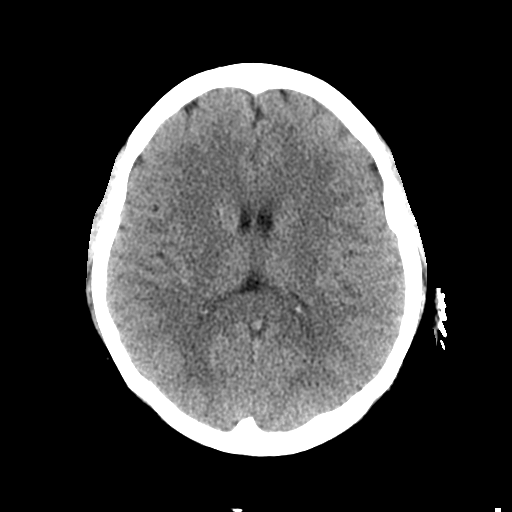
[im 16/31  bone]
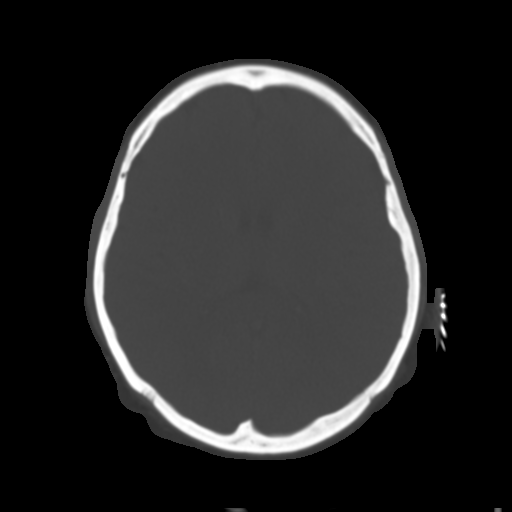
[im 19/31  brain]
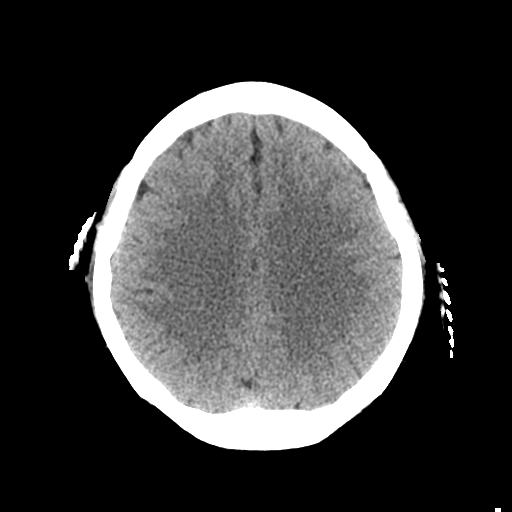
[im 22/31  brain]
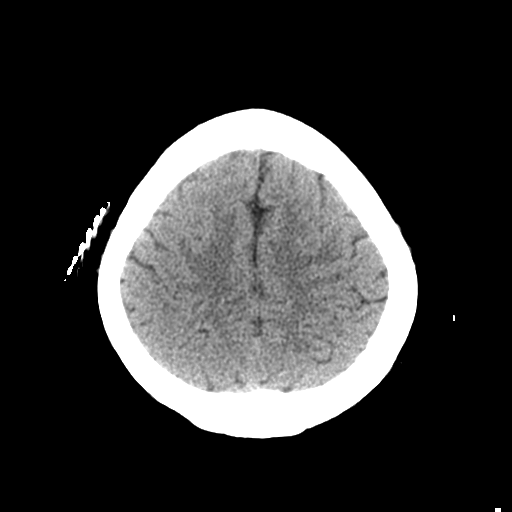
[im 25/31  brain]
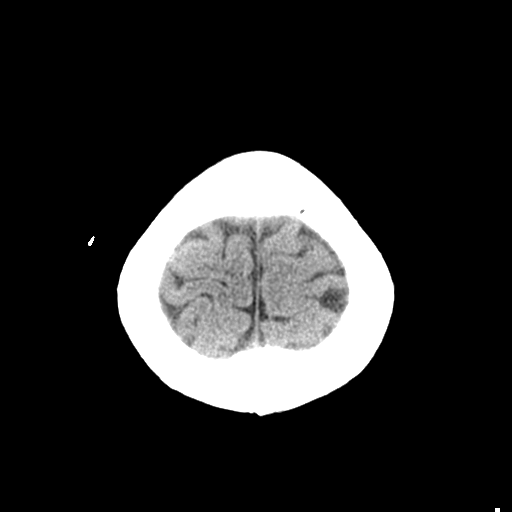
[im 28/31  brain]
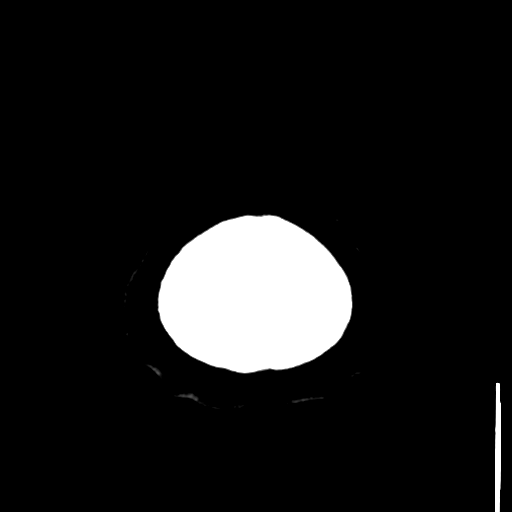
[im 28/31  bone]
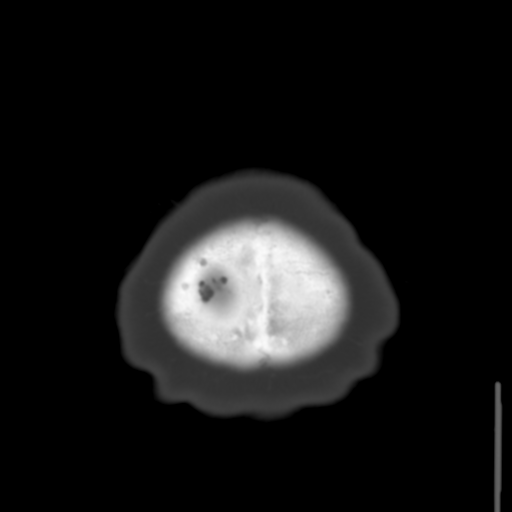

[Series 5: coronal soft tissue · coronal · 0.30mm/px · 3 of 74 slices shown]
[im 25/74  brain]
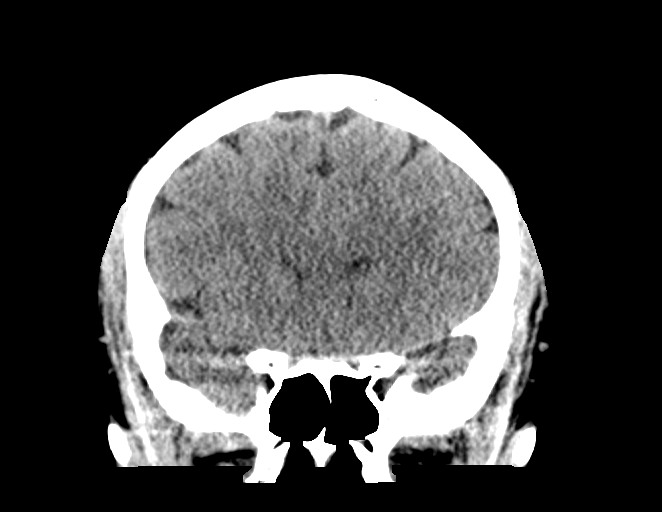
[im 33/74  brain]
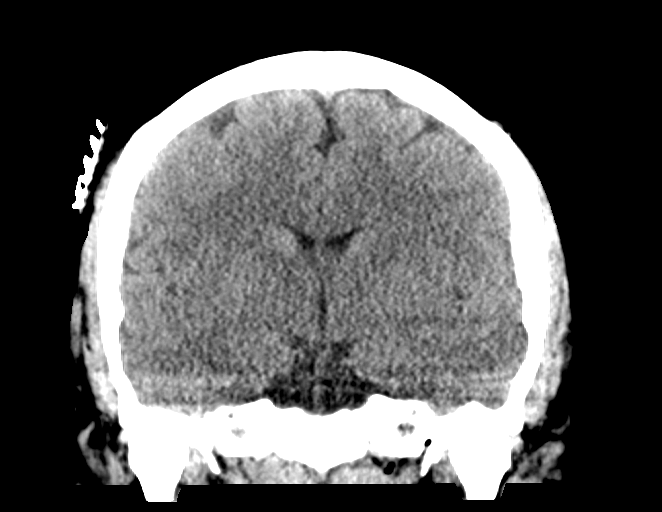
[im 41/74  brain]
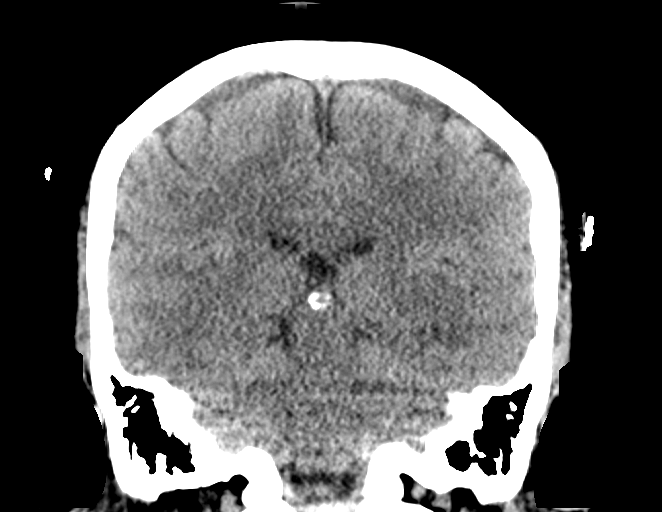

[Series 6: sagittal soft tissue · sagittal · 0.34mm/px · 3 of 62 slices shown]
[im 21/62  brain]
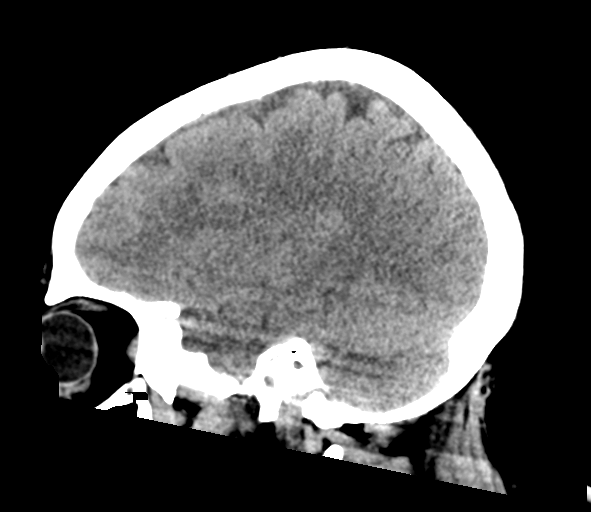
[im 31/62  brain]
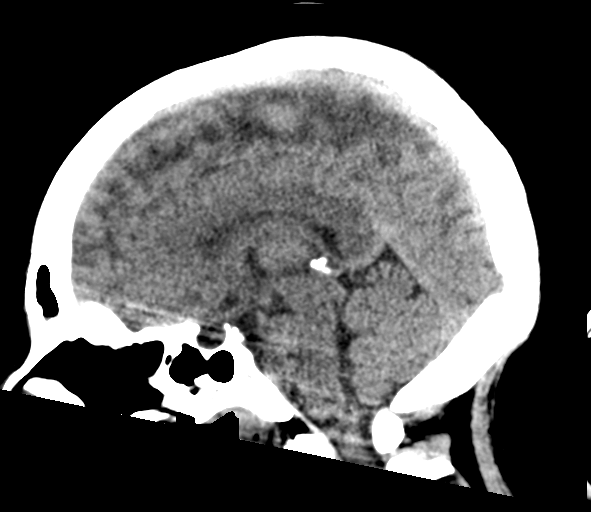
[im 41/62  brain]
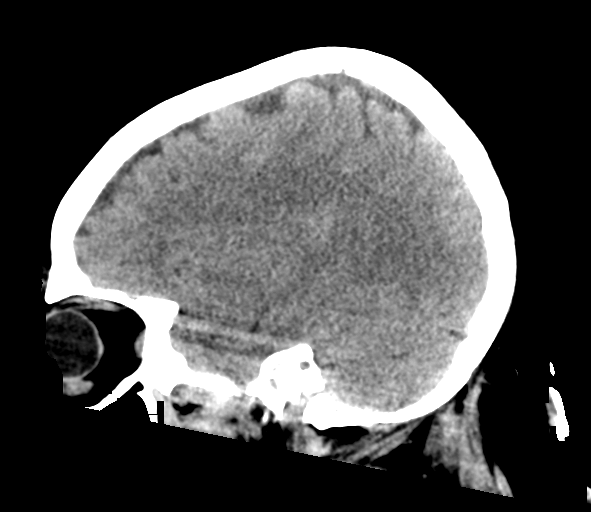

[15 of 47 positions shown; findings below may reference images not displayed]

FINDINGS: Brain: No acute intracranial findings are seen. Ventricles are not
dilated. There is no shift of midline structures. There are no
epidural or subdural fluid collections.

Vascular: Unremarkable

Skull: Unremarkable.

Sinuses/Orbits: There is mucosal thickening in the ethmoid sinus.

Other: None
IMPRESSION: No acute intracranial findings are seen.

Chronic ethmoid sinusitis.

## 2023-04-11 ENCOUNTER — Emergency Department
Admission: EM | Admit: 2023-04-11 | Discharge: 2023-04-11 | Disposition: A | Discharge status: Home / Self Care | Payer: No Typology Code available for payment source | Attending: Emergency Medicine | Admitting: Emergency Medicine

## 2023-04-11 ENCOUNTER — Other Ambulatory Visit: Payer: Self-pay

## 2023-04-11 DIAGNOSIS — L0291 Cutaneous abscess, unspecified: Secondary | ICD-10-CM | POA: Diagnosis present

## 2023-04-11 DIAGNOSIS — L02416 Cutaneous abscess of left lower limb: Secondary | ICD-10-CM | POA: Insufficient documentation

## 2023-04-11 DIAGNOSIS — I10 Essential (primary) hypertension: Secondary | ICD-10-CM | POA: Diagnosis not present

## 2023-04-11 MED ORDER — DOXYCYCLINE MONOHYDRATE 100 MG PO TABS: 100.0000 mg | ORAL_TABLET | Freq: Two times a day (BID) | ORAL | 0 refills | Status: AC

## 2023-04-11 MED ORDER — IBUPROFEN 600 MG PO TABS
600.0000 mg | ORAL_TABLET | Freq: Once | ORAL | Status: AC
  Administered 2023-04-11: 600 mg via ORAL
  Filled 2023-04-11: qty 1

## 2023-04-11 MED ORDER — LIDOCAINE HCL (PF) 1 % IJ SOLN
5.0000 mL | Freq: Once | INTRAMUSCULAR | Status: AC
  Administered 2023-04-11: 5 mL
  Filled 2023-04-11: qty 5

## 2023-04-11 MED ORDER — CEPHALEXIN 500 MG PO CAPS: 500.0000 mg | ORAL_CAPSULE | Freq: Two times a day (BID) | ORAL | 0 refills | Status: AC

## 2023-04-11 NOTE — Discharge Instructions (Addendum)
 You are seen in the emergency department for an abscess.  It was drained in the emergency department and you had packing placed.  Your packing needs to be removed in 5 days with your primary care physician.  You return to the emergency department if you are unable to get in with your primary care doctor.  If you start developing redness and warmth in the area start the antibiotics that were called then.  If you have ongoing signs of infection after being on the antibiotics for 2 days return for reevaluation.  Alternate Motrin and Tylenol for pain control.  Pain control:  Ibuprofen (motrin/aleve/advil) - You can take 3 tablets (600 mg) every 6 hours as needed for pain/fever.  Acetaminophen (tylenol) - You can take 2 extra strength tablets (1000 mg) every 6 hours as needed for pain/fever.  You can alternate these medications or take them together.  Make sure you eat food/drink water when taking these medications.  Doxycycline - This medication can cause acid reflux.  It is important that you take it with food and drink plenty of water.  Do not lie down for 1 hour after taking this medication.  It also causes sun sensitivity so stay out of the sun or wear SPF while on this medication.  Thank you for choosing [place] for your health care, it was my pleasure to care for you today!  Corena Herter, MD

## 2023-04-11 NOTE — ED Provider Notes (Signed)
 [facility] Provider Note    Event Date/Time   First MD Initiated Contact with Patient 04/11/23 1511     (approximate)   History   Abscess   HPI  Yvette Wilson is a 43 y.o. female past medical history significant for hypertension, abscess to the left axilla, who presents to the emergency department with an abscess.  Patient noted some mild pain over the past couple of days to her left inner thigh and then noted that there was a hard bump and she was concerned that she had an abscess.  Denies fever, chills, nausea or vomiting.  Denies any surrounding erythema or warmth.  No recent antibiotic use.     Physical Exam   Triage Vital Signs: ED Triage Vitals [04/11/23 1236]  Encounter Vitals Group     BP (!) 165/104     Systolic BP Percentile      Diastolic BP Percentile      Pulse Rate 72     Resp 20     Temp 98.3 F (36.8 C)     Temp Source Oral     SpO2 100 %     Weight 283 lb (128.4 kg)     Height 5\' 7"  (1.702 m)     Head Circumference      Peak Flow      Pain Score 10     Pain Loc      Pain Education      Exclude from Growth Chart     Most recent vital signs: Vitals:   04/11/23 1236 04/11/23 1643  BP: (!) 165/104 (!) 145/88  Pulse: 72 70  Resp: 20 17  Temp: 98.3 F (36.8 C) 98 F (36.7 C)  SpO2: 100% 99%    Physical Exam Constitutional:      Appearance: She is well-developed.  HENT:     Head: Atraumatic.  Eyes:     Conjunctiva/sclera: Conjunctivae normal.  Cardiovascular:     Rate and Rhythm: Regular rhythm.  Pulmonary:     Effort: No respiratory distress.  Abdominal:     General: There is no distension.  Musculoskeletal:        General: Normal range of motion.     Cervical back: Normal range of motion.     Comments: Fluctuance to the left inner thigh with no surrounding erythema warmth or induration.  No crepitance  Skin:    General: Skin is warm.  Neurological:     Mental Status: She is alert. Mental status is at  baseline.      IMPRESSION / MDM / ASSESSMENT AND PLAN / ED COURSE  I reviewed the triage vital signs and the nursing notes.  Differential diagnosis including abscess, cellulitis, hidradenitis superlative   Labs (all labs ordered are listed, but only abnormal results are displayed) Labs interpreted as -    Labs Reviewed - No data to display    Incision and drainage of the left inner thigh abscess with significant purulent drainage.  No obvious surrounding erythema or warmth.  Packing was placed and discussed follow-up with primary care physician to have packing removal.  Discussed return precautions if she had any worsening symptoms.  Discussed if she developed any surrounding erythema or warmth to start antibiotics.  Discussed if she started antibiotics for 2 days did not have improvement to return for reevaluation.   PROCEDURES:  Critical Care performed: No  .Incision and Drainage  Date/Time: 04/11/2023 7:17 PM  Performed by: Corena Herter, MD  Authorized by: Corena Herter, MD   Consent:    Consent obtained:  Verbal   Consent given by:  Patient   Risks, benefits, and alternatives were discussed: yes     Risks discussed:  Bleeding, incomplete drainage, pain and infection   Alternatives discussed:  No treatment and delayed treatment Universal protocol:    Procedure explained and questions answered to patient or proxy's satisfaction: yes     Relevant documents present and verified: yes     Test results available : yes     Required blood products, implants, devices, and special equipment available: yes     Patient identity confirmed:  Verbally with patient, arm band and provided demographic data Location:    Type:  Abscess   Size:  3 x 2 cm   Location:  Lower extremity   Lower extremity location:  Leg   Leg location:  L upper leg Anesthesia:    Anesthesia method:  Local infiltration   Local anesthetic:  Lidocaine 1% w/o epi Procedure type:    Complexity:   Simple Procedure details:    Incision types:  Single straight   Wound management:  Probed and deloculated   Drainage amount:  Moderate   Wound treatment:  Drain placed   Packing materials:  1/4 in iodoform gauze Post-procedure details:    Procedure completion:  Tolerated well, no immediate complications   Patient's presentation is most consistent with acute complicated illness / injury requiring diagnostic workup.   MEDICATIONS ORDERED IN ED: Medications  lidocaine (PF) (XYLOCAINE) 1 % injection 5 mL (5 mLs Other Given by Other 04/11/23 1606)  ibuprofen (ADVIL) tablet 600 mg (600 mg Oral Given 04/11/23 1536)    FINAL CLINICAL IMPRESSION(S) / ED DIAGNOSES   Final diagnoses:  Abscess     Rx / DC Orders   ED Discharge Orders          Ordered    doxycycline (ADOXA) 100 MG tablet  2 times daily        04/11/23 1623    cephALEXin (KEFLEX) 500 MG capsule  2 times daily        04/11/23 1623             Note:  This document was prepared using Dragon voice recognition software and may include unintentional dictation errors.   Corena Herter, MD 04/11/23 1919

## 2023-04-11 NOTE — ED Triage Notes (Signed)
 Patient states abscess to left inner thigh for 1 week.

## 2023-05-09 ENCOUNTER — Other Ambulatory Visit: Payer: Self-pay

## 2023-05-09 ENCOUNTER — Emergency Department
Admission: EM | Admit: 2023-05-09 | Discharge: 2023-05-09 | Disposition: A | Discharge status: Home / Self Care | Attending: Emergency Medicine | Admitting: Emergency Medicine

## 2023-05-09 DIAGNOSIS — Z79899 Other long term (current) drug therapy: Secondary | ICD-10-CM | POA: Insufficient documentation

## 2023-05-09 DIAGNOSIS — I10 Essential (primary) hypertension: Secondary | ICD-10-CM | POA: Insufficient documentation

## 2023-05-09 DIAGNOSIS — M545 Low back pain, unspecified: Secondary | ICD-10-CM | POA: Diagnosis present

## 2023-05-09 DIAGNOSIS — M5441 Lumbago with sciatica, right side: Secondary | ICD-10-CM | POA: Insufficient documentation

## 2023-05-09 LAB — URINALYSIS, ROUTINE W REFLEX MICROSCOPIC
Bilirubin Urine: NEGATIVE
Glucose, UA: NEGATIVE mg/dL
Hgb urine dipstick: NEGATIVE
Ketones, ur: NEGATIVE mg/dL
Leukocytes,Ua: NEGATIVE
Nitrite: NEGATIVE
Protein, ur: NEGATIVE mg/dL
Specific Gravity, Urine: 1.011 (ref 1.005–1.030)
pH: 6 (ref 5.0–8.0)

## 2023-05-09 LAB — BASIC METABOLIC PANEL
Anion gap: 9 (ref 5–15)
BUN: 16 mg/dL (ref 6–20)
CO2: 25 mmol/L (ref 22–32)
Calcium: 9 mg/dL (ref 8.9–10.3)
Chloride: 106 mmol/L (ref 98–111)
Creatinine, Ser: 0.98 mg/dL (ref 0.44–1.00)
GFR, Estimated: >60 mL/min (ref >60)
Glucose, Bld: 117 mg/dL — ABNORMAL HIGH (ref 70–99)
Potassium: 3.9 mmol/L (ref 3.5–5.1)
Sodium: 140 mmol/L (ref 135–145)

## 2023-05-09 LAB — CBC
HCT: 35.3 % — ABNORMAL LOW (ref 36.0–46.0)
Hemoglobin: 11.3 g/dL — ABNORMAL LOW (ref 12.0–15.0)
MCH: 22.8 pg — ABNORMAL LOW (ref 26.0–34.0)
MCHC: 32 g/dL (ref 30.0–36.0)
MCV: 71.3 fL — ABNORMAL LOW (ref 80.0–100.0)
Platelets: 355 10*3/uL (ref 150–400)
RBC: 4.95 MIL/uL (ref 3.87–5.11)
RDW: 18.3 % — ABNORMAL HIGH (ref 11.5–15.5)
WBC: 9 10*3/uL (ref 4.0–10.5)
nRBC: 0 % (ref 0.0–0.2)

## 2023-05-09 LAB — POC URINE PREG, ED: Preg Test, Ur: NEGATIVE

## 2023-05-09 MED ORDER — OXYCODONE-ACETAMINOPHEN 5-325 MG PO TABS: 1.0000 | ORAL_TABLET | ORAL | 0 refills | Status: DC | PRN

## 2023-05-09 MED ORDER — NAPROXEN 500 MG PO TABS: 500.0000 mg | ORAL_TABLET | Freq: Two times a day (BID) | ORAL | 0 refills | Status: AC

## 2023-05-09 MED ORDER — OXYCODONE-ACETAMINOPHEN 5-325 MG PO TABS
1.0000 | ORAL_TABLET | Freq: Once | ORAL | Status: AC
  Administered 2023-05-09: 1 via ORAL
  Filled 2023-05-09: qty 1

## 2023-05-09 MED ORDER — DIAZEPAM 5 MG PO TABS
5.0000 mg | ORAL_TABLET | Freq: Once | ORAL | Status: AC
  Administered 2023-05-09: 5 mg via ORAL
  Filled 2023-05-09: qty 1

## 2023-05-09 MED ORDER — DIAZEPAM 5 MG PO TABS: 5.0000 mg | ORAL_TABLET | Freq: Three times a day (TID) | ORAL | 0 refills | Status: AC | PRN

## 2023-05-09 MED ORDER — ONDANSETRON 4 MG PO TBDP
4.0000 mg | ORAL_TABLET | Freq: Once | ORAL | Status: AC
  Administered 2023-05-09: 4 mg via ORAL
  Filled 2023-05-09: qty 1

## 2023-05-09 MED ORDER — KETOROLAC TROMETHAMINE 60 MG/2ML IM SOLN
30.0000 mg | Freq: Once | INTRAMUSCULAR | Status: AC
  Administered 2023-05-09: 30 mg via INTRAMUSCULAR
  Filled 2023-05-09: qty 2

## 2023-05-09 NOTE — ED Triage Notes (Signed)
 Pt arrives with c/o mid to lower back pain that started yesterday. Pt was getting out of her car and the pain started. Per pt, the pain radiates into her leg bilaterally. Pt endorses tingling in her legs also. Pt took a muscle relaxer without relief. Pt denies urinary/stool incontinence.

## 2023-05-09 NOTE — ED Provider Notes (Signed)
 [facility] Provider Note    Event Date/Time   First MD Initiated Contact with Patient 05/09/23 [phone]     (approximate)   History   Back Pain   HPI  Yvette Wilson is a 43 y.o. female who presents to the ED from home with a chief complaint of nontraumatic lower back pain.  Patient reports pain incurred while getting out of her SUV yesterday.  Pain radiates into her leg bilaterally, worse on the right.  Took a muscle relaxer without relief of symptoms.  History of cervical myelopathy status post surgery.  Denies fever/chills, chest pain, shortness of breath, abdominal pain, nausea, vomiting or dizziness.  Denies dysuria, bowel or bladder incontinence, extremity weakness.     Past Medical History   Past Medical History:  Diagnosis Date   Gestational diabetes    Gestational, diet treated   Gestational hypertension 11/09/2015   History of pre-eclampsia 1999   birth of first child   Hypertension    with all pregnancies, and then continued after last pregnancy   Sickle cell trait [facility])      Active Problem List   Patient Active Problem List   Diagnosis Date Noted   Essential hypertension 09/05/2020   Iron deficiency anemia 08/22/2020   Thrombocytopenia (HCC) 08/05/2020   Leukocytosis 08/05/2020   Bradycardia 08/05/2020   Hypertensive urgency 08/04/2020   Prediabetes 06/25/2019   Anxiety attack 07/07/2014     Past Surgical History   Past Surgical History:  Procedure Laterality Date   APPENDECTOMY     Dec 2007   CESAREAN SECTION     May 2008, breech   CESAREAN SECTION WITH BILATERAL TUBAL LIGATION N/A 12/17/2015   Procedure: CESAREAN SECTION WITH BILATERAL TUBAL LIGATION;  Surgeon: Vena Austria, MD;  Location: [facility] ORS;  Service: Obstetrics;  Laterality: N/A;     Home Medications   Prior to Admission medications   Medication Sig Start Date End Date Taking? Authorizing Provider  diazepam (VALIUM) 5 MG tablet Take 1 tablet (5 mg total)  by mouth every 8 (eight) hours as needed for anxiety. 05/09/23  Yes Irean Hong, MD  diazepam (VALIUM) 5 MG tablet Take 1 tablet (5 mg total) by mouth every 8 (eight) hours as needed for anxiety. 05/09/23  Yes Irean Hong, MD  naproxen (NAPROSYN) 500 MG tablet Take 1 tablet (500 mg total) by mouth 2 (two) times daily with a meal. 05/09/23  Yes Irean Hong, MD  naproxen (NAPROSYN) 500 MG tablet Take 1 tablet (500 mg total) by mouth 2 (two) times daily with a meal. 05/09/23  Yes Irean Hong, MD  oxyCODONE-acetaminophen (PERCOCET/ROXICET) 5-325 MG tablet Take 1 tablet by mouth every 4 (four) hours as needed for severe pain (pain score 7-10). 05/09/23  Yes Irean Hong, MD  oxyCODONE-acetaminophen (PERCOCET/ROXICET) 5-325 MG tablet Take 1 tablet by mouth every 4 (four) hours as needed for severe pain (pain score 7-10). 05/09/23  Yes Irean Hong, MD  amLODipine (NORVASC) 10 MG tablet Take 1 tablet (10 mg total) by mouth daily. 12/12/21 03/12/22  Fisher, Roselyn Bering, PA-C  chlorhexidine (HIBICLENS) 4 % external liquid Apply topically daily as needed. 01/11/22   Poggi, Herb Grays, PA-C  hydrochlorothiazide (HYDRODIURIL) 25 MG tablet Take 1 tablet (25 mg total) by mouth daily. 12/12/21 03/12/22  Fisher, Roselyn Bering, PA-C  valsartan (DIOVAN) 160 MG tablet Take 1 tablet (160 mg total) by mouth daily. 12/12/21 03/12/22  Faythe Ghee, PA-C  Allergies  Penicillins   Family History   Family History  Problem Relation Age of Onset   Cancer Maternal Aunt      Physical Exam  Triage Vital Signs: ED Triage Vitals [05/09/23 0159]  Encounter Vitals Group     BP (!) 147/97     Systolic BP Percentile      Diastolic BP Percentile      Pulse Rate 81     Resp 19     Temp 98.1 F (36.7 C)     Temp src      SpO2 100 %     Weight 283 lb (128.4 kg)     Height      Head Circumference      Peak Flow      Pain Score 10     Pain Loc      Pain Education      Exclude from Growth Chart     Updated Vital Signs: BP  (!) 141/89 (BP Location: Right Arm)   Pulse 79   Temp 98.1 F (36.7 C) (Oral)   Resp 18   Wt 128.4 kg   SpO2 100%   BMI 44.32 kg/m    General: Awake, mild distress.  Tearful. CV:  RRR.  Good peripheral perfusion.  Resp:  Normal effort.  CTAB. Abd:  Nontender.  No distention.  Other:  No midline spinal tenderness to palpation.  Right paraspinal muscle spasms.  Right buttock mildly tender to palpation.  Negative straight leg raise bilaterally.  5/5 motor strength and sensation BLE.   ED Results / Procedures / Treatments  Labs (all labs ordered are listed, but only abnormal results are displayed) Labs Reviewed  BASIC METABOLIC PANEL - Abnormal; Notable for the following components:      Result Value   Glucose, Bld 117 (*)    All other components within normal limits  CBC - Abnormal; Notable for the following components:   Hemoglobin 11.3 (*)    HCT 35.3 (*)    MCV 71.3 (*)    MCH 22.8 (*)    RDW 18.3 (*)    All other components within normal limits  URINALYSIS, ROUTINE W REFLEX MICROSCOPIC - Abnormal; Notable for the following components:   Color, Urine STRAW (*)    APPearance CLEAR (*)    All other components within normal limits  POC URINE PREG, ED - Normal     EKG  None   RADIOLOGY None   Official radiology report(s): No results found.   PROCEDURES:  Critical Care performed: No  Procedures   MEDICATIONS ORDERED IN ED: Medications  ketorolac (TORADOL) injection 30 mg (30 mg Intramuscular Given 05/09/23 0358)  oxyCODONE-acetaminophen (PERCOCET/ROXICET) 5-325 MG per tablet 1 tablet (1 tablet Oral Given 05/09/23 0357)  ondansetron (ZOFRAN-ODT) disintegrating tablet 4 mg (4 mg Oral Given 05/09/23 0358)  diazepam (VALIUM) tablet 5 mg (5 mg Oral Given 05/09/23 0357)  oxyCODONE-acetaminophen (PERCOCET/ROXICET) 5-325 MG per tablet 1 tablet (1 tablet Oral Given 05/09/23 0459)     IMPRESSION / MDM / ASSESSMENT AND PLAN / ED COURSE  I reviewed the triage vital  signs and the nursing notes.                             43 year old female presenting with back pain.  Differential diagnosis includes but is not limited to renal colic, UTI, pyelonephritis, musculoskeletal, sciatica, AAA, malignancy, etc.  I personally reviewed patient's records and note  a orthopedic surgery spine visit on 05/02/2023 for follow-up cervical myelopathy.  Patient's presentation is most consistent with acute complicated illness / injury requiring diagnostic workup.  Laboratory and urine results unremarkable.  Will administer IM Ketorolac, Percocet, Valium for pain and muscle relaxation.  Will reassess.  Clinical Course as of 05/09/23 0518  Tue May 09, 2023  0453 Patient feeling better, will add another Percocet at this time.  Will prescribe as needed pain medicine and muscle relaxer and patient will follow-up with her spine doctor.  Strict return precautions given.  Patient verbalizes understanding and agrees with plan of care. [JS]    Clinical Course User Index [JS] Irean Hong, MD     FINAL CLINICAL IMPRESSION(S) / ED DIAGNOSES   Final diagnoses:  Acute bilateral low back pain with right-sided sciatica     Rx / DC Orders   ED Discharge Orders          Ordered    naproxen (NAPROSYN) 500 MG tablet  2 times daily with meals        05/09/23 0402    oxyCODONE-acetaminophen (PERCOCET/ROXICET) 5-325 MG tablet  Every 4 hours PRN        05/09/23 0402    diazepam (VALIUM) 5 MG tablet  Every 8 hours PRN        05/09/23 0402    naproxen (NAPROSYN) 500 MG tablet  2 times daily with meals        05/09/23 0454    oxyCODONE-acetaminophen (PERCOCET/ROXICET) 5-325 MG tablet  Every 4 hours PRN        05/09/23 0454    diazepam (VALIUM) 5 MG tablet  Every 8 hours PRN        05/09/23 0454             Note:  This document was prepared using Dragon voice recognition software and may include unintentional dictation errors.   Irean Hong, MD 05/09/23 [phone]

## 2023-05-09 NOTE — Discharge Instructions (Addendum)
 You may take medicines as needed for pain and muscle spasms.  Apply moist heat to affected area several times daily.  Return to the ER for worsening symptoms, persistent vomiting, difficulty breathing, losing control of your bowels or bladder, leg weakness or other concerns.

## 2023-09-08 ENCOUNTER — Other Ambulatory Visit: Payer: Self-pay

## 2023-09-08 ENCOUNTER — Encounter: Payer: Self-pay | Admitting: Emergency Medicine

## 2023-09-08 ENCOUNTER — Emergency Department
Admission: EM | Admit: 2023-09-08 | Discharge: 2023-09-08 | Disposition: A | Discharge status: Home / Self Care | Attending: Emergency Medicine | Admitting: Emergency Medicine

## 2023-09-08 DIAGNOSIS — R509 Fever, unspecified: Secondary | ICD-10-CM | POA: Insufficient documentation

## 2023-09-08 DIAGNOSIS — B349 Viral infection, unspecified: Secondary | ICD-10-CM | POA: Diagnosis not present

## 2023-09-08 DIAGNOSIS — J029 Acute pharyngitis, unspecified: Secondary | ICD-10-CM | POA: Diagnosis present

## 2023-09-08 LAB — RESP PANEL BY RT-PCR (RSV, FLU A&B, COVID)  RVPGX2
Influenza A by PCR: NEGATIVE
Influenza B by PCR: NEGATIVE
Resp Syncytial Virus by PCR: NEGATIVE
SARS Coronavirus 2 by RT PCR: NEGATIVE

## 2023-09-08 LAB — GROUP A STREP BY PCR: Group A Strep by PCR: NOT DETECTED

## 2023-09-08 MED ORDER — IBUPROFEN 600 MG PO TABS
600.0000 mg | ORAL_TABLET | Freq: Once | ORAL | Status: AC
  Administered 2023-09-08: 600 mg via ORAL
  Filled 2023-09-08: qty 1

## 2023-09-08 NOTE — ED Provider Notes (Signed)
   [facility] Provider Note    Event Date/Time   First MD Initiated Contact with Patient 09/08/23 [phone]     (approximate)   History   Sore Throat   HPI  Yvette Wilson is a 43 y.o. female with history of hypertension, IDA and as listed in EMR presents to the emergency department for treatment and evaluation of sore throat headache with bodyaches and chills that has worsened overnight. She took Tylenol  yesterday afternoon, otherwise no alleviating measures. No known exposure.     Physical Exam    Vitals:   09/08/23 0742  BP: (!) 141/99  Pulse: 89  Resp: 16  Temp: 99.3 F (37.4 C)  SpO2: 100%    General: Awake, no distress.  CV:  Good peripheral perfusion.  Resp:  Normal effort. Breath sounds clear. Abd:  No distention.  Other:  Tonsils 2+ without exudate. Uvula is midline.   ED Results / Procedures / Treatments   Labs (all labs ordered are listed, but only abnormal results are displayed)  Labs Reviewed  GROUP A STREP BY PCR  RESP PANEL BY RT-PCR (RSV, FLU A&B, COVID)  [id number]     EKG  Not indicated.    RADIOLOGY  Image and radiology report reviewed and interpreted by me. Radiology report consistent with the same.  Not indicated.  PROCEDURES:  Critical Care performed: No  Procedures   MEDICATIONS ORDERED IN ED:  Medications  ibuprofen  (ADVIL ) tablet 600 mg (600 mg Oral Given 09/08/23 0817)     IMPRESSION / MDM / ASSESSMENT AND PLAN / ED COURSE   I have reviewed the triage note and vital signs. Vital signs stable.   Differential diagnosis includes, but is not limited to, Covid, influenza, strep throat, viral syndrome  Patient's presentation is most consistent with acute illness / injury with system symptoms.  44 year old female presents to the emergency department for treatment and evaluation of fever, headache, sore throat and body aches since yesterday. See HPI.  Exam is reassuring.  COVID, influenza, RSV, and  strep test are pending.  Respiratory panel and strep test are negative. Symptoms are still consistent with Covid and there is an outbreak in the community at this time. This was discussed with the patient. She will rotate tylenol  and ibuprofen  and stay well hydrated. If not improving or if symptoms worsen, she is to see primary care or return to the ER.      FINAL CLINICAL IMPRESSION(S) / ED DIAGNOSES   Final diagnoses:  Acute viral syndrome     Rx / DC Orders   ED Discharge Orders     None        Note:  This document was prepared using Dragon voice recognition software and may include unintentional dictation errors.   Herlinda Kirk NOVAK, FNP 09/08/23 9084    Viviann Pastor, MD 09/08/23 [phone]

## 2023-09-08 NOTE — ED Notes (Signed)
 See triage note  Presents with low grade temp and body aches  states she developed sxs' yesterday   Also having h/a along with the sore throat

## 2023-09-08 NOTE — Discharge Instructions (Signed)
 Rotate tylenol  and ibuprofen  every 4 hours.  Rest and stay well hydrated. Follow up with primary care if not improving over the next week.

## 2023-09-08 NOTE — ED Triage Notes (Signed)
 C/O sore throat and headache yesterday.  Symptoms worsening overnight.  C/O Chills.  Body aches.  Tylenol  taken yesterday at around 1400.  AAOx3.  Skin warm and dry. NAD

## 2023-10-10 ENCOUNTER — Encounter: Payer: Self-pay | Admitting: *Deleted

## 2023-10-10 ENCOUNTER — Other Ambulatory Visit: Payer: Self-pay

## 2023-10-10 DIAGNOSIS — I10 Essential (primary) hypertension: Secondary | ICD-10-CM | POA: Diagnosis not present

## 2023-10-10 DIAGNOSIS — M542 Cervicalgia: Secondary | ICD-10-CM | POA: Diagnosis not present

## 2023-10-10 DIAGNOSIS — O24419 Gestational diabetes mellitus in pregnancy, unspecified control: Secondary | ICD-10-CM | POA: Diagnosis not present

## 2023-10-10 DIAGNOSIS — Z79899 Other long term (current) drug therapy: Secondary | ICD-10-CM | POA: Diagnosis not present

## 2023-10-10 DIAGNOSIS — R519 Headache, unspecified: Secondary | ICD-10-CM | POA: Diagnosis present

## 2023-10-10 LAB — CBC WITH DIFFERENTIAL/PLATELET
Abs Immature Granulocytes: 0.06 K/uL (ref 0.00–0.07)
Basophils Absolute: 0.1 K/uL (ref 0.0–0.1)
Basophils Relative: 1 %
Eosinophils Absolute: 0.4 K/uL (ref 0.0–0.5)
Eosinophils Relative: 4 %
HCT: 35.7 % — ABNORMAL LOW (ref 36.0–46.0)
Hemoglobin: 11.5 g/dL — ABNORMAL LOW (ref 12.0–15.0)
Immature Granulocytes: 1 %
Lymphocytes Relative: 38 %
Lymphs Abs: 3.6 K/uL (ref 0.7–4.0)
MCH: 24 pg — ABNORMAL LOW (ref 26.0–34.0)
MCHC: 32.2 g/dL (ref 30.0–36.0)
MCV: 74.5 fL — ABNORMAL LOW (ref 80.0–100.0)
Monocytes Absolute: 0.9 K/uL (ref 0.1–1.0)
Monocytes Relative: 9 %
Neutro Abs: 4.4 K/uL (ref 1.7–7.7)
Neutrophils Relative %: 47 %
Platelets: 350 K/uL (ref 150–400)
RBC: 4.79 MIL/uL (ref 3.87–5.11)
RDW: 17.7 % — ABNORMAL HIGH (ref 11.5–15.5)
WBC: 9.4 K/uL (ref 4.0–10.5)
nRBC: 0 % (ref 0.0–0.2)

## 2023-10-10 LAB — BASIC METABOLIC PANEL WITH GFR
Anion gap: 11 (ref 5–15)
BUN: 15 mg/dL (ref 6–20)
CO2: 26 mmol/L (ref 22–32)
Calcium: 8.8 mg/dL — ABNORMAL LOW (ref 8.9–10.3)
Chloride: 104 mmol/L (ref 98–111)
Creatinine, Ser: 1.2 mg/dL — ABNORMAL HIGH (ref 0.44–1.00)
GFR, Estimated: 58 mL/min — ABNORMAL LOW (ref >60)
Glucose, Bld: 107 mg/dL — ABNORMAL HIGH (ref 70–99)
Potassium: 3.6 mmol/L (ref 3.5–5.1)
Sodium: 141 mmol/L (ref 135–145)

## 2023-10-10 LAB — POC URINE PREG, ED

## 2023-10-10 NOTE — ED Notes (Signed)
POC urine NEGATIVE 

## 2023-10-10 NOTE — ED Triage Notes (Signed)
 Pt endorses right (temporal, radiates to behind her ear) sided headache since this morning, she took her BP meds around 1500 today. Denies vision changes, n/v or dizziness.

## 2023-10-11 ENCOUNTER — Emergency Department

## 2023-10-11 ENCOUNTER — Emergency Department
Admission: EM | Admit: 2023-10-11 | Discharge: 2023-10-11 | Disposition: A | Discharge status: Home / Self Care | Attending: Emergency Medicine | Admitting: Emergency Medicine

## 2023-10-11 DIAGNOSIS — R519 Headache, unspecified: Secondary | ICD-10-CM

## 2023-10-11 MED ORDER — METHOCARBAMOL 500 MG PO TABS: 500.0000 mg | ORAL_TABLET | Freq: Three times a day (TID) | ORAL | 0 refills | Status: AC | PRN

## 2023-10-11 MED ORDER — IOHEXOL 350 MG/ML SOLN
75.0000 mL | Freq: Once | INTRAVENOUS | Status: AC | PRN
  Administered 2023-10-11: 75 mL via INTRAVENOUS

## 2023-10-11 MED ORDER — PROCHLORPERAZINE EDISYLATE 10 MG/2ML IJ SOLN
10.0000 mg | Freq: Once | INTRAMUSCULAR | Status: AC
  Administered 2023-10-11: 10 mg via INTRAVENOUS
  Filled 2023-10-11: qty 2

## 2023-10-11 MED ORDER — KETOROLAC TROMETHAMINE 30 MG/ML IJ SOLN
30.0000 mg | Freq: Once | INTRAMUSCULAR | Status: AC
  Administered 2023-10-11: 30 mg via INTRAVENOUS
  Filled 2023-10-11: qty 1

## 2023-10-11 MED ORDER — SODIUM CHLORIDE 0.9 % IV BOLUS (SEPSIS)
1000.0000 mL | Freq: Once | INTRAVENOUS | Status: AC
  Administered 2023-10-11: 1000 mL via INTRAVENOUS

## 2023-10-11 MED ORDER — DROPERIDOL 2.5 MG/ML IJ SOLN
2.5000 mg | Freq: Once | INTRAMUSCULAR | Status: DC
  Filled 2023-10-11: qty 2

## 2023-10-11 NOTE — Discharge Instructions (Addendum)
 CT scan showed no aneurysm, bleeding in the brain, sign of stroke.  No acute abnormality seen to the cervical spine.  CT did incidentally show abnormalities with your thyroid  and we recommend an outpatient thyroid  ultrasound which can be ordered by your primary care doctor.  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 Further evaluation  with dedicated thyroid  ultrasound recommended   You may alternate over the counter Tylenol  1000 mg every 6 hours as needed for pain, fever and Ibuprofen  800 mg every 6-8 hours as needed for pain, fever.  Please take Ibuprofen  with food.  Do not take more than 4000 mg of Tylenol  (acetaminophen ) in a 24 hour period.  We have discharged you with a prescription for muscle relaxers.  You may take this as needed which may help with your neck pain and help with your headache.  These will make you drowsy so do not take them if you have to drive, work and do not take them with other medications that may make you sleepy or alcohol.  I recommend close follow-up with your primary care doctor if symptoms continue.  Please return to the emergency department if symptoms worsen or you have numbness, tingling, weakness, changes in vision or speech.  Stretching your neck, gentle massage, alternating heat and ice may also help with pain.  I suspect that you have some trapezius muscle spasm, tightness that may be leading to a tension headache.

## 2023-10-11 NOTE — ED Provider Notes (Signed)
 [facility] Provider Note    Event Date/Time   First MD Initiated Contact with Patient 10/11/23 0036     (approximate)   History   Headache   HPI  Yvette Wilson is a 43 y.o. female with history of hypertension, recent cervical fusion secondary to cervical myelopathy in March 2025 at [facility] who presents to the emergency department with right-sided headache, neck pain.  Denies any head or neck injury.  Denies numbness, tingling or weakness.  States she feels some fullness in the ear and discomfort behind her eye.  Feels like her vision is blurry but no loss of vision.  Denies history of similar headaches.  Headache was gradual in nature.  No sudden onset.  No fever.  No neck stiffness.   History provided by patient.    Past Medical History:  Diagnosis Date   Gestational diabetes    Gestational, diet treated   Gestational hypertension 11/09/2015   History of pre-eclampsia 1999   birth of first child   Hypertension    with all pregnancies, and then continued after last pregnancy   Sickle cell trait [facility])     Past Surgical History:  Procedure Laterality Date   APPENDECTOMY     Dec 2007   CESAREAN SECTION     May 2008, breech   CESAREAN SECTION WITH BILATERAL TUBAL LIGATION N/A 12/17/2015   Procedure: CESAREAN SECTION WITH BILATERAL TUBAL LIGATION;  Surgeon: Glory High, MD;  Location: [facility] ORS;  Service: Obstetrics;  Laterality: N/A;    MEDICATIONS:  Prior to Admission medications   Medication Sig Start Date End Date Taking? Authorizing Provider  amLODipine  (NORVASC ) 10 MG tablet Take 1 tablet (10 mg total) by mouth daily. 12/12/21 03/12/22  Fisher, Devere ORN, PA-C  chlorhexidine  (HIBICLENS ) 4 % external liquid Apply topically daily as needed. 01/11/22   Poggi, Jenna E, PA-C  diazepam  (VALIUM ) 5 MG tablet Take 1 tablet (5 mg total) by mouth every 8 (eight) hours as needed for anxiety. 05/09/23   Sung, Jade J, MD  diazepam  (VALIUM ) 5 MG tablet Take 1  tablet (5 mg total) by mouth every 8 (eight) hours as needed for anxiety. 05/09/23   Sung, Jade J, MD  hydrochlorothiazide  (HYDRODIURIL ) 25 MG tablet Take 1 tablet (25 mg total) by mouth daily. 12/12/21 03/12/22  Fisher, Devere ORN, PA-C  naproxen  (NAPROSYN ) 500 MG tablet Take 1 tablet (500 mg total) by mouth 2 (two) times daily with a meal. 05/09/23   Robinette Vermell PARAS, MD  naproxen  (NAPROSYN ) 500 MG tablet Take 1 tablet (500 mg total) by mouth 2 (two) times daily with a meal. 05/09/23   Robinette Vermell PARAS, MD  valsartan  (DIOVAN ) 160 MG tablet Take 1 tablet (160 mg total) by mouth daily. 12/12/21 03/12/22  Gasper Devere ORN, PA-C    Physical Exam   Triage Vital Signs: ED Triage Vitals  Encounter Vitals Group     BP 10/10/23 2245 (!) 169/110     Girls Systolic BP Percentile --      Girls Diastolic BP Percentile --      Boys Systolic BP Percentile --      Boys Diastolic BP Percentile --      Pulse Rate 10/10/23 2245 69     Resp 10/10/23 2245 20     Temp 10/10/23 2245 98.6 F (37 C)     Temp src --      SpO2 10/10/23 2245 96 %     Weight --  Height --      Head Circumference --      Peak Flow --      Pain Score 10/10/23 2247 9     Pain Loc --      Pain Education --      Exclude from Growth Chart --     Most recent vital signs: Vitals:   10/11/23 0130 10/11/23 0406  BP: (!) 144/76 (!) 157/99  Pulse: (!) 57 69  Resp: 16 20  Temp:  98.2 F (36.8 C)  SpO2: 100% 100%    CONSTITUTIONAL: Alert, responds appropriately to questions.  Appears uncomfortable, anxious, tearful HEAD: Normocephalic, atraumatic EYES: Conjunctivae clear, pupils appear equal, sclera nonicteric ENT: normal nose; moist mucous membranes; No pharyngeal erythema or petechiae, no tonsillar hypertrophy or exudate, no uvular deviation, no unilateral swelling in posterior oropharynx, no trismus or drooling, no muffled voice, normal phonation, no stridor, airway patent.  TMs are clear bilaterally without erythema, purulence,  bulging, perforation, effusion.  No cerumen impaction or sign of foreign body in the external auditory canal. No inflammation, erythema or drainage from the external auditory canal. No signs of mastoiditis. No pain with manipulation of the pinna bilaterally. NECK: Supple, normal ROM, no midline cervical spine tenderness or step-off or deformity.  He is tender to palpation over the right trapezius muscle which is tight without overlying skin changes.  No meningismus.  Trachea midline.  No thyromegaly. CARD: RRR; S1 and S2 appreciated RESP: Normal chest excursion without splinting or tachypnea; breath sounds clear and equal bilaterally; no wheezes, no rhonchi, no rales, no hypoxia or respiratory distress, speaking full sentences ABD/GI: Non-distended; soft, non-tender, no rebound, no guarding, no peritoneal signs BACK: The back appears normal EXT: Normal ROM in all joints; no deformity noted, no edema SKIN: Normal color for age and race; warm; no rash on exposed skin NEURO: Moves all extremities equally, normal speech, normal sensation diffusely, cranial nerves II through XII intact PSYCH: The patient's mood and manner are appropriate.   ED Results / Procedures / Treatments   LABS: (all labs ordered are listed, but only abnormal results are displayed) Labs Reviewed  BASIC METABOLIC PANEL WITH GFR - Abnormal; Notable for the following components:      Result Value   Glucose, Bld 107 (*)    Creatinine, Ser 1.20 (*)    Calcium 8.8 (*)    GFR, Estimated 58 (*)    All other components within normal limits  CBC WITH DIFFERENTIAL/PLATELET - Abnormal; Notable for the following components:   Hemoglobin 11.5 (*)    HCT 35.7 (*)    MCV 74.5 (*)    MCH 24.0 (*)    RDW 17.7 (*)    All other components within normal limits  POC URINE PREG, ED - Normal     EKG:  EKG Interpretation Date/Time:    Ventricular Rate:    PR Interval:    QRS Duration:    QT Interval:    QTC Calculation:   R  Axis:      Text Interpretation:           RADIOLOGY: My personal review and interpretation of imaging: CT scan shows no aneurysm, intracranial hemorrhage.  Cervical spine shows no acute change.  I have personally reviewed all radiology reports.   CT C-SPINE NO CHARGE Result Date: 10/11/2023 CLINICAL DATA:  Initial evaluation for acute neck pain. EXAM: CT CERVICAL SPINE WITHOUT CONTRAST TECHNIQUE: Multidetector CT imaging of the cervical spine was performed without intravenous  contrast. Multiplanar CT image reconstructions were also generated. RADIATION DOSE REDUCTION: This exam was performed according to the departmental dose-optimization program which includes automated exposure control, adjustment of the mA and/or kV according to patient size and/or use of iterative reconstruction technique. COMPARISON:  None Available. FINDINGS: Alignment: Reversal of the normal cervical lordosis, apex at C5-6. Trace anterolisthesis of C5 on C6, likely degenerative. Skull base and vertebrae: Skull base intact. Normal C1-2 articulations are preserved in the dens is intact. Vertebral body height maintained with no acute or chronic fracture. No worrisome osseous lesions. Soft tissues and spinal canal: Paraspinous soft tissues demonstrate no acute finding. No abnormal prevertebral edema. Heterogeneous and enlarged left lobe of thyroid  with a few superimposed nodules, largest of which measures approximately 1.5 cm. Spinal canal demonstrates no acute abnormality. Disc levels: Prior ACDF at C3-4 and C4-5. No hardware complication. Mild adjacent segment disease with degenerative spondylosis at C5-6. No significant spinal stenosis by CT. Upper chest: Visualized upper chest demonstrates no other acute finding. Other: None. IMPRESSION: 1. No acute osseous abnormality within the cervical spine. 2. Prior ACDF at C3-4 and C4-5 without hardware complication. 3. 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 4. Heterogeneous and enlarged left lobe of thyroid  with a few superimposed nodules, largest of which measures approximately 1.5 cm. Further evaluation with dedicated thyroid  ultrasound recommended (ref: J Am Coll Radiol. 2015 Feb;12(2): 143-50). Electronically Signed   By: Morene Hoard M.D.   On: 10/11/2023 03:45   CT ANGIO HEAD NECK W WO CM Result Date: 10/11/2023 CLINICAL DATA:  Initial evaluation for acute headache. EXAM: CT ANGIOGRAPHY HEAD AND NECK WITH AND WITHOUT CONTRAST TECHNIQUE: Multidetector CT imaging of the head and neck was performed using the standard protocol during bolus administration of intravenous contrast. Multiplanar CT image reconstructions and MIPs were obtained to evaluate the vascular anatomy. Carotid stenosis measurements (when applicable) are obtained utilizing NASCET criteria, using the distal internal carotid diameter as the denominator. RADIATION DOSE REDUCTION: This exam was performed according to the departmental dose-optimization program which includes automated exposure control, adjustment of the mA and/or kV according to patient size and/or use of iterative reconstruction technique. CONTRAST:  75mL OMNIPAQUE  IOHEXOL  350 MG/ML SOLN COMPARISON:  None Available. FINDINGS: CT HEAD FINDINGS Brain: Cerebral volume within normal limits for patient age. No acute intracranial hemorrhage. No acute large vessel territory infarct. No mass lesion, midline shift, or mass effect. Ventricles are normal in size without hydrocephalus. No extra-axial fluid collection. Vascular: No abnormal hyperdense vessel. Skull: Scalp soft tissues demonstrate no acute abnormality. Calvarium intact. Sinuses/Orbits: Globes and orbital soft tissues within normal limits. Mild scattered mucosal thickening present about the ethmoidal air cells. Paranasal sinuses are otherwise clear. No mastoid effusion. CTA NECK FINDINGS Aortic arch: Visualized aortic arch within normal  limits for caliber with standard branch pattern. No stenosis or other abnormality about the origin the great vessels. Right carotid system: No evidence of dissection, stenosis (50% or greater), or occlusion. Left carotid system: No evidence of dissection, stenosis (50% or greater), or occlusion. Vertebral arteries: Vertebral artery somewhat limited assessment due to streak artifact from fusion hardware and timing the contrast bolus. Vertebral arteries are patent without visible stenosis or dissection. Skeleton: No worrisome osseous lesions.  Prior ACDF at C3-C5. Other neck: No other acute finding. Heterogeneous left lobe of thyroid  with a few small nodules, largest of which measures approximately 1.5 cm. Upper chest: No other acute finding. Review of the MIP  images confirms the above findings CTA HEAD FINDINGS Anterior circulation: Both internal carotid arteries are patent to the termini without stenosis or other abnormality. Left A1 segment patent. Right A1 hypoplastic and/or absent. Normal anterior communicating artery complex. Anterior cerebral arteries patent without significant stenosis. No M1 stenosis or occlusion. No proximal MCA branch occlusion. Distal MCA branches perfused and symmetric. Posterior circulation: Both V4 segments patent without significant stenosis. Both PICA grossly patent at their origins. Basilar diminutive but patent without stenosis. Superior cerebral arteries patent bilaterally. Both PCAs widely patent to their distal aspects without significant stenosis. Venous sinuses: Patent allowing for timing the contrast bolus. Anatomic variants: As above.  No aneurysm. Review of the MIP images confirms the above findings IMPRESSION: 1. Negative CTA of the head and neck. No large vessel occlusion, hemodynamically significant stenosis, or other acute vascular abnormality. No aneurysm. 2. No other acute intracranial abnormality. 3. Heterogeneous left lobe of thyroid  with a few small nodules, largest  of which measures approximately 1.5 cm. Further evaluation with dedicated thyroid  ultrasound recommended (ref: J Am Coll Radiol. 2015 Feb;12(2): 143-50). Electronically Signed   By: Morene Hoard M.D.   On: 10/11/2023 03:36     PROCEDURES:  Critical Care performed: No      .1-3 Lead EKG Interpretation  Performed by: Gwendelyn Lanting, Josette SAILOR, DO Authorized by: Baelyn Doring, Josette SAILOR, DO     Interpretation: normal     ECG rate:  69   ECG rate assessment: normal     Rhythm: sinus rhythm     Ectopy: none     Conduction: normal       IMPRESSION / MDM / ASSESSMENT AND PLAN / ED COURSE  I reviewed the triage vital signs and the nursing notes.    Patient here with headache, neck pain.  No focal neurodeficits.  The patient is on the cardiac monitor to evaluate for evidence of arrhythmia and/or significant heart rate changes.   DIFFERENTIAL DIAGNOSIS (includes but not limited to):   Tension headache, trapezius muscle spasm/strain, migraine, intracranial hemorrhage, ruptured aneurysm, less likely cervical spine stenosis, cervical myelopathy, discitis or osteomyelitis, transverse myelitis, epidural abscess or hematoma, intracranial mass   Patient's presentation is most consistent with acute presentation with potential threat to life or bodily function.   PLAN: Will obtain labs, urine, pregnancy test, CTA of the head and neck, CT of the cervical spine.  Will give IV fluids, Compazine .   MEDICATIONS GIVEN IN ED: Medications  sodium chloride  0.9 % bolus 1,000 mL (0 mLs Intravenous Stopped 10/11/23 0417)  prochlorperazine  (COMPAZINE ) injection 10 mg (10 mg Intravenous Given 10/11/23 0150)  iohexol  (OMNIPAQUE ) 350 MG/ML injection 75 mL (75 mLs Intravenous Contrast Given 10/11/23 0236)  ketorolac  (TORADOL ) 30 MG/ML injection 30 mg (30 mg Intravenous Given 10/11/23 0404)     ED COURSE: Patient reports pain is improving.  She is now able to smile, laugh.  Labs show normal hemoglobin,  electrolytes.  Creatinine minimally elevated at 1.2.  Pregnancy test negative.  CT scans reviewed and interpreted by myself and the radiologist and showed no LVO, stenosis of any vessel, aneurysm, intracranial hemorrhage, postoperative complication to her spine, fracture.  Will give Toradol , droperidol  and reassess.   Nurse reports patient agrees to receiving Toradol  but would like to be able to drive herself home and is eager for discharge as she has to take care of her children in the morning for school.  Will hold droperidol  and work on discharge papers.  Will have her follow-up with  her PCP and neurosurgeon as an outpatient.  Suspect tension headache, trapezius muscle strain, spasm.  Recommended ibuprofen , stretching, gentle massage and will discharge with muscle relaxers.   At this time, I do not feel there is any life-threatening condition present. I reviewed all nursing notes, vitals, pertinent previous records.  All lab and urine results, EKGs, imaging ordered have been independently reviewed and interpreted by myself.  I reviewed all available radiology reports from any imaging ordered this visit.  Based on my assessment, I feel the patient is safe to be discharged home without further emergent workup and can continue workup as an outpatient as needed. Discussed all findings, treatment plan as well as usual and customary return precautions.  They verbalize understanding and are comfortable with this plan.  Outpatient follow-up has been provided as needed.  All questions have been answered.    CONSULTS:  none   OUTSIDE RECORDS REVIEWED: Reviewed previous neurosurgical notes.       FINAL CLINICAL IMPRESSION(S) / ED DIAGNOSES   Final diagnoses:  Bad headache     Rx / DC Orders   ED Discharge Orders          Ordered    methocarbamol  (ROBAXIN ) 500 MG tablet  Every 8 hours PRN        10/11/23 0403             Note:  This document was prepared using Dragon voice  recognition software and may include unintentional dictation errors.   Kallon Caylor, Josette SAILOR, DO 10/11/23 [phone]

## 2023-10-11 NOTE — ED Notes (Signed)
 Questions and concerns addressed. Discharge teaching completed.   Prescriptions reviewed and pharmacy verified.   Pt ambulatory upon discharge.
